# Patient Record
Sex: Male | Born: 1965
Health system: Southern US, Community
[De-identification: ages and names within clinical notes are randomized; demographics above are authoritative.]

## PROBLEM LIST (undated history)

## (undated) DIAGNOSIS — I10 Essential (primary) hypertension: Secondary | ICD-10-CM

## (undated) DIAGNOSIS — K5900 Constipation, unspecified: Secondary | ICD-10-CM

## (undated) DIAGNOSIS — D696 Thrombocytopenia, unspecified: Secondary | ICD-10-CM

## (undated) DIAGNOSIS — K922 Gastrointestinal hemorrhage, unspecified: Secondary | ICD-10-CM

## (undated) DIAGNOSIS — I8500 Esophageal varices without bleeding: Secondary | ICD-10-CM

## (undated) DIAGNOSIS — K759 Inflammatory liver disease, unspecified: Secondary | ICD-10-CM

## (undated) DIAGNOSIS — K760 Fatty (change of) liver, not elsewhere classified: Secondary | ICD-10-CM

## (undated) DIAGNOSIS — G4700 Insomnia, unspecified: Secondary | ICD-10-CM

## (undated) DIAGNOSIS — M1990 Unspecified osteoarthritis, unspecified site: Secondary | ICD-10-CM

## (undated) DIAGNOSIS — K920 Hematemesis: Secondary | ICD-10-CM

## (undated) DIAGNOSIS — E669 Obesity, unspecified: Secondary | ICD-10-CM

## (undated) DIAGNOSIS — Z860100 Personal history of colon polyps, unspecified: Secondary | ICD-10-CM

## (undated) DIAGNOSIS — K7581 Nonalcoholic steatohepatitis (NASH): Secondary | ICD-10-CM

## (undated) DIAGNOSIS — I864 Gastric varices: Secondary | ICD-10-CM

## (undated) DIAGNOSIS — K7460 Unspecified cirrhosis of liver: Secondary | ICD-10-CM

## (undated) DIAGNOSIS — D649 Anemia, unspecified: Secondary | ICD-10-CM

## (undated) DIAGNOSIS — T7840XA Allergy, unspecified, initial encounter: Secondary | ICD-10-CM

## (undated) DIAGNOSIS — D500 Iron deficiency anemia secondary to blood loss (chronic): Secondary | ICD-10-CM

## (undated) DIAGNOSIS — Z8619 Personal history of other infectious and parasitic diseases: Secondary | ICD-10-CM

## (undated) DIAGNOSIS — K3184 Gastroparesis: Secondary | ICD-10-CM

## (undated) DIAGNOSIS — G709 Myoneural disorder, unspecified: Secondary | ICD-10-CM

## (undated) DIAGNOSIS — G4730 Sleep apnea, unspecified: Secondary | ICD-10-CM

## (undated) DIAGNOSIS — M542 Cervicalgia: Secondary | ICD-10-CM

## (undated) DIAGNOSIS — E1143 Type 2 diabetes mellitus with diabetic autonomic (poly)neuropathy: Secondary | ICD-10-CM

## (undated) DIAGNOSIS — D61818 Other pancytopenia: Secondary | ICD-10-CM

## (undated) DIAGNOSIS — K219 Gastro-esophageal reflux disease without esophagitis: Secondary | ICD-10-CM

## (undated) DIAGNOSIS — R350 Frequency of micturition: Secondary | ICD-10-CM

## (undated) DIAGNOSIS — E119 Type 2 diabetes mellitus without complications: Secondary | ICD-10-CM

## (undated) DIAGNOSIS — R200 Anesthesia of skin: Secondary | ICD-10-CM

## (undated) DIAGNOSIS — R51 Headache: Secondary | ICD-10-CM

## (undated) DIAGNOSIS — D689 Coagulation defect, unspecified: Secondary | ICD-10-CM

## (undated) HISTORY — DX: Anemia, unspecified: D64.9

## (undated) HISTORY — DX: Iron deficiency anemia secondary to blood loss (chronic): D50.0

## (undated) HISTORY — PX: WISDOM TOOTH EXTRACTION: SHX21

## (undated) HISTORY — DX: Obesity, unspecified: E66.9

## (undated) HISTORY — DX: Allergy, unspecified, initial encounter: T78.40XA

## (undated) HISTORY — DX: Coagulation defect, unspecified: D68.9

## (undated) HISTORY — DX: Myoneural disorder, unspecified: G70.9

## (undated) HISTORY — DX: Personal history of colonic polyps: Z86.010

## (undated) HISTORY — DX: Essential (primary) hypertension: I10

## (undated) HISTORY — DX: Gastroparesis: K31.84

## (undated) HISTORY — DX: Unspecified cirrhosis of liver: K74.60

## (undated) HISTORY — DX: Fatty (change of) liver, not elsewhere classified: K76.0

## (undated) HISTORY — DX: Type 2 diabetes mellitus with diabetic autonomic (poly)neuropathy: E11.43

## (undated) HISTORY — DX: Gastro-esophageal reflux disease without esophagitis: K21.9

---

## 1999-02-24 ENCOUNTER — Ambulatory Visit (HOSPITAL_COMMUNITY): Admission: RE | Admit: 1999-02-24 | Discharge: 1999-02-24 | Payer: Self-pay | Admitting: Pulmonary Disease

## 1999-02-24 ENCOUNTER — Encounter: Payer: Self-pay | Admitting: Pulmonary Disease

## 2000-02-18 ENCOUNTER — Encounter: Admission: RE | Admit: 2000-02-18 | Discharge: 2000-05-18 | Payer: Self-pay | Admitting: Internal Medicine

## 2003-05-17 ENCOUNTER — Encounter: Admission: RE | Admit: 2003-05-17 | Discharge: 2003-05-17 | Payer: Self-pay | Admitting: Internal Medicine

## 2004-05-08 ENCOUNTER — Ambulatory Visit: Payer: Self-pay | Admitting: Internal Medicine

## 2004-05-20 ENCOUNTER — Ambulatory Visit (HOSPITAL_BASED_OUTPATIENT_CLINIC_OR_DEPARTMENT_OTHER): Admission: RE | Admit: 2004-05-20 | Discharge: 2004-05-20 | Payer: Self-pay | Admitting: Internal Medicine

## 2004-07-01 ENCOUNTER — Ambulatory Visit: Payer: Self-pay | Admitting: Internal Medicine

## 2008-02-09 ENCOUNTER — Ambulatory Visit: Payer: Self-pay | Admitting: Internal Medicine

## 2008-03-19 ENCOUNTER — Ambulatory Visit: Payer: Self-pay | Admitting: Internal Medicine

## 2008-06-18 ENCOUNTER — Ambulatory Visit: Payer: Self-pay | Admitting: Internal Medicine

## 2008-07-01 ENCOUNTER — Ambulatory Visit: Payer: Self-pay | Admitting: Internal Medicine

## 2008-07-04 ENCOUNTER — Ambulatory Visit: Payer: Self-pay | Admitting: Internal Medicine

## 2008-07-19 ENCOUNTER — Ambulatory Visit: Payer: Self-pay | Admitting: Internal Medicine

## 2008-08-16 ENCOUNTER — Ambulatory Visit: Payer: Self-pay | Admitting: Internal Medicine

## 2008-10-24 ENCOUNTER — Ambulatory Visit: Payer: Self-pay | Admitting: Internal Medicine

## 2008-12-05 ENCOUNTER — Ambulatory Visit: Payer: Self-pay | Admitting: Internal Medicine

## 2008-12-19 ENCOUNTER — Ambulatory Visit: Payer: Self-pay | Admitting: Internal Medicine

## 2008-12-26 ENCOUNTER — Ambulatory Visit: Payer: Self-pay | Admitting: Internal Medicine

## 2009-03-07 ENCOUNTER — Ambulatory Visit: Payer: Self-pay | Admitting: Internal Medicine

## 2009-06-27 ENCOUNTER — Ambulatory Visit: Payer: Self-pay | Admitting: Internal Medicine

## 2009-12-12 ENCOUNTER — Ambulatory Visit: Payer: Self-pay | Admitting: Internal Medicine

## 2010-07-02 ENCOUNTER — Other Ambulatory Visit: Payer: Self-pay | Admitting: Internal Medicine

## 2010-07-03 ENCOUNTER — Encounter: Payer: Self-pay | Admitting: Internal Medicine

## 2010-07-24 ENCOUNTER — Encounter (INDEPENDENT_AMBULATORY_CARE_PROVIDER_SITE_OTHER): Payer: BC Managed Care – PPO | Admitting: Internal Medicine

## 2010-07-24 DIAGNOSIS — E785 Hyperlipidemia, unspecified: Secondary | ICD-10-CM

## 2010-07-24 DIAGNOSIS — I10 Essential (primary) hypertension: Secondary | ICD-10-CM

## 2010-07-24 DIAGNOSIS — E119 Type 2 diabetes mellitus without complications: Secondary | ICD-10-CM

## 2010-09-11 NOTE — Procedures (Signed)
NAMEGURVIR, [name]         ACCOUNT NO.:  [id number]   MEDICAL RECORD NO.:  [id number]          PATIENT TYPE:  OUT   LOCATION:  SLEEP CENTER                 FACILITY:  [facility]   PHYSICIAN:  Clinton D. Annamaria Boots, M.D. DATE OF BIRTH:  12/23/1965   DATE OF STUDY:  05/20/2004                              NOCTURNAL POLYSOMNOGRAM   STUDY DATE:  May 20, 2004   REFERRING PHYSICIAN:  Dr. Baird Lyons   INDICATION FOR STUDY:  Hypersomnia with sleep apnea.  Epworth Sleepiness  Score 11/24.  BMI 43.  Weight 294 pounds.  Neck size 18-1/2 inches.   SLEEP ARCHITECTURE:  Total sleep time 345 minutes with sleep efficiency 80%.  Stage I was 14%, stage II 56%, stages III and IV 3%, REM was 27% of total  sleep time.  Sleep latency 24 minutes, REM latency 127 minutes, awake after  sleep onset 64 minutes, arousal index elevated 61.  Most arousals appeared  to be respiratory related.   RESPIRATORY DATA:  Split-study protocol.  RDI 114 per hour indicating very  severe obstructive sleep apnea/hypopnea syndrome before CPAP.  This included  160 obstructive apneas and 87 hypopneas before CPAP.  Events were not  positional.  REM RDI 47.  CPAP was titrated to 17 CWP, RDI 3.8 per hour.  A  small/wide Respironics ComfortSelect Nasal Mask was used with heated  humidifier.   OXYGEN DATA:  Moderate snoring with oxygen desaturation to a nadir of 55%  before CPAP.  After CPAP control saturation held 95-98% on room air.   CARDIAC RHYTHM:  Normal.   MOVEMENT/PARASOMNIA:  No significant movement disturbance.   IMPRESSION/RECOMMENDATION:  1.  Very severe obstructive sleep apnea/hypopnea syndrome, RDI 114 per hour      with desaturation to 55%.  2.  Successful continuous positive airway pressure control at 17 CWP, RDI      3.8 per hour, using a small/wide Respironics ComfortSelect Nasal Mask      with heated humidifier.      CDY/MEDQ  D:  05/24/2004 12:11:29  T:  05/24/2004 19:56:31  Job:  [id number]

## 2011-01-29 ENCOUNTER — Ambulatory Visit: Payer: BC Managed Care – PPO | Admitting: Internal Medicine

## 2011-02-05 ENCOUNTER — Encounter: Payer: Self-pay | Admitting: Internal Medicine

## 2011-02-05 ENCOUNTER — Ambulatory Visit: Payer: BC Managed Care – PPO | Admitting: Internal Medicine

## 2011-02-05 ENCOUNTER — Other Ambulatory Visit: Payer: Self-pay | Admitting: Internal Medicine

## 2011-02-05 ENCOUNTER — Ambulatory Visit (INDEPENDENT_AMBULATORY_CARE_PROVIDER_SITE_OTHER): Payer: BC Managed Care – PPO | Admitting: Internal Medicine

## 2011-02-05 DIAGNOSIS — R7401 Elevation of levels of liver transaminase levels: Secondary | ICD-10-CM

## 2011-02-05 DIAGNOSIS — E119 Type 2 diabetes mellitus without complications: Secondary | ICD-10-CM

## 2011-02-05 DIAGNOSIS — I10 Essential (primary) hypertension: Secondary | ICD-10-CM

## 2011-02-05 DIAGNOSIS — K760 Fatty (change of) liver, not elsewhere classified: Secondary | ICD-10-CM

## 2011-02-05 DIAGNOSIS — R748 Abnormal levels of other serum enzymes: Secondary | ICD-10-CM

## 2011-02-05 DIAGNOSIS — Z23 Encounter for immunization: Secondary | ICD-10-CM

## 2011-02-05 DIAGNOSIS — K7689 Other specified diseases of liver: Secondary | ICD-10-CM

## 2011-02-05 DIAGNOSIS — E785 Hyperlipidemia, unspecified: Secondary | ICD-10-CM

## 2011-02-05 DIAGNOSIS — K219 Gastro-esophageal reflux disease without esophagitis: Secondary | ICD-10-CM

## 2011-02-05 LAB — HEMOGLOBIN A1C: Mean Plasma Glucose: 341 mg/dL — ABNORMAL HIGH (ref <117)

## 2011-02-06 LAB — LIPID PANEL
Cholesterol: 245 mg/dL — ABNORMAL HIGH (ref 0–200)
VLDL: 25 mg/dL (ref 0–40)

## 2011-02-06 LAB — HEPATIC FUNCTION PANEL
ALT: 75 U/L — ABNORMAL HIGH (ref 0–53)
AST: 85 U/L — ABNORMAL HIGH (ref 0–37)
Bilirubin, Direct: 0.3 mg/dL (ref 0.0–0.3)
Indirect Bilirubin: 0.9 mg/dL (ref 0.0–0.9)

## 2011-02-08 ENCOUNTER — Encounter: Payer: Self-pay | Admitting: Internal Medicine

## 2011-02-08 NOTE — Progress Notes (Signed)
Spoke with patient regarding HgA1c results.  He is going to check blood sugars AC/HS and return for visit in 2 weeks.

## 2011-02-18 ENCOUNTER — Telehealth: Payer: Self-pay

## 2011-02-18 NOTE — Telephone Encounter (Signed)
Freestyle test strips not covered by patients insurance, and they would not  Let us do a prior authoriazation for them. Only covered meters are Accucheckss and One Touch Ultras, which they can receive at no cost. Patient's wife informed of this

## 2011-02-21 DIAGNOSIS — I10 Essential (primary) hypertension: Secondary | ICD-10-CM | POA: Insufficient documentation

## 2011-02-21 DIAGNOSIS — K7460 Unspecified cirrhosis of liver: Secondary | ICD-10-CM

## 2011-02-21 DIAGNOSIS — R748 Abnormal levels of other serum enzymes: Secondary | ICD-10-CM | POA: Insufficient documentation

## 2011-02-21 DIAGNOSIS — K7469 Other cirrhosis of liver: Secondary | ICD-10-CM | POA: Insufficient documentation

## 2011-02-21 DIAGNOSIS — E785 Hyperlipidemia, unspecified: Secondary | ICD-10-CM | POA: Insufficient documentation

## 2011-02-21 HISTORY — DX: Unspecified cirrhosis of liver: K74.60

## 2011-02-21 NOTE — Progress Notes (Signed)
  Subjective:    Patient ID: Oscar [name], male    DOB: [birth year], 45 y.o.   MRN: [id number]  HPI 45 year old male with history of hypertension, adult onset diabetes, fatty liver with abnormal liver functions, obesity, GE reflux. In today for followup of medical problems and to obtain lab work including a hemoglobin A1c. Has lost 20 7 AM since March 2012. Says he has changed his eating habits and doesn't eat as much fast food or unhealthy foods as he use to. However, says Accu-Chek machine is broken. I have given him a new one today along with prescription for diabetic test strips and lancets. He needs to check glucose at least once daily but prefer twice daily testing. I am concerned his glucose may not be as well controlled as we would like. He had Pneumovax immunization July 2010, tetanus immunization March 2011. Saw Dr. Hazle Quant for diabetic eye exam 2010 and is scheduled to see him in 2012.    Review of Systems     Objective:   Physical Exam neck is supple without JVD thyromegaly or carotid bruits; chest clear to auscultation; cardiac exam regular rate and rhythm normal S1 and S2; no lower extremity edema, no issues with ulcers on feet. Pulses intact bilaterally in feet.        Assessment & Plan:  Adult onset diabetes  Hypertension  Fatty liver with abnormal liver functions  Hyperlipidemia  Plan: Obtain hemoglobin A1c and make further recommendations at that time.

## 2011-02-21 NOTE — Patient Instructions (Signed)
We are pleased with your weight loss. However there is concern that he had not been monitoring her Accu-Cheks at least daily. New machine has been provided to use along with prescription for diabetic test strips and lancets. Please try to keep a log of your Accu-Cheks and return in 6-8 weeks for reevaluation.

## 2011-02-26 ENCOUNTER — Ambulatory Visit: Payer: BC Managed Care – PPO | Admitting: Internal Medicine

## 2011-03-12 ENCOUNTER — Encounter: Payer: Self-pay | Admitting: Internal Medicine

## 2011-03-12 ENCOUNTER — Ambulatory Visit (INDEPENDENT_AMBULATORY_CARE_PROVIDER_SITE_OTHER): Payer: BC Managed Care – PPO | Admitting: Internal Medicine

## 2011-03-12 DIAGNOSIS — E119 Type 2 diabetes mellitus without complications: Secondary | ICD-10-CM

## 2011-03-12 DIAGNOSIS — I10 Essential (primary) hypertension: Secondary | ICD-10-CM

## 2011-03-12 DIAGNOSIS — E785 Hyperlipidemia, unspecified: Secondary | ICD-10-CM

## 2011-03-12 DIAGNOSIS — R7989 Other specified abnormal findings of blood chemistry: Secondary | ICD-10-CM

## 2011-03-12 DIAGNOSIS — R110 Nausea: Secondary | ICD-10-CM

## 2011-03-12 NOTE — Patient Instructions (Signed)
Please try to keep a record of your Accu-Cheks at least 3 times daily before meals and hopefully at bedtime as well. Use sliding scale with short acting insulin as described 3 units for Accu-Chek 200-240 and 5 units from 240-300. Return in 4 weeks. Stop Byetta.

## 2011-03-12 NOTE — Progress Notes (Signed)
Subjective:    Patient ID: Oscar [name], male    DOB: 1966-03-09, 45 y.o.   MRN: [id number]  HPI 45 year old male with history of diabetes and hypertension. He was seen here mid October and hemoglobin A1c was not good at 13.5%. Had him come back to discuss his regimen today. He's on Janumet 50/500 twice daily plus Byetta supposedly twice daily but he's only been taking it once daily. He says recently he's been nauseated. He thinks the Byetta is causing the nausea. Sometimes he has to vomit. He has a history of fatty liver. He's cut back considerably on alcohol consumption but still in mid October had elevated liver functions with alkaline phosphatase being 140, SGOT 85 SGPT 75. Total cholesterol was 245 and LDL cholesterol was 153.  I spent considerable time today trying to figure out his work schedule. 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 He is a day usually begins at 10:00 at night and he drives 12 hours making 45 stops along the way to deliver pizzas. At 10 AM the next day, he is required to take a 10 hour break to sleep and rest according to trucking regulations. During this overnight chorea he will generally eat a [employer] sandwich before departing and not eat again until 10 AM the next day which may consist of [place] food or another [employer] sandwich. Therefore he and his not eating more than 2 meals daily at the present time. Sometimes he gets home as early as 6:30 in the morning. These are on "short days "2 days a week. On Friday and Saturday nights he goes to bed around 10 or 11 PM. On Sunday night, he stays up until 5 AM trying to get acclimated for his long night trip on Monday night. I have explained to him it would be best if he would stop it 3 AM and have a light meal. He thinks he can do the as a cause he has a delivery stop it about that time. He doesn't bring any Accu-Chek readings in today. I recently gave him a new meter. He says  that this morning his Accu-Chek was 244 at 6:30 AM. He says that around 10:30 AM his Accu-Chek was 134. He ate a small piece of [place] and then slept for couple of hours before coming to the doctor at 2:45 PM. This is all very confused and difficult  What to do with his glucose management. I am afraid to put him on long-acting excellence such as Lantus because his hours change so frequently during the work week. Also I don't have that information because he doesn't bring in multiple Accu-Chek readings. He says that his insurance that they would not approve more than once a day Accu-Cheks. I'm going to submit a prescription to [facility] for Accu-Cheks a.c. and at bedtime. Also submitted new prescription for new glucose monitor which they should cover. He says they did not cover the Freestyle machine that I gave him at last visit so he could not get test strips covered by them. It seems that the Byetta is causing considerable nausea. I am concerned about the elevated liver functions which should have improved with his weight loss. In March 2012 his Accu-Chek was 6.7%. In August 2011 he weighed 254 pounds. In March 2012 he weighed 271 pounds. He weighed 292 pounds in August 2009. Has lost 30 pounds since March. It seems his diabetes is now out of control.  He doesn't complain of polyuria or polyphagia. In fact, says that he feels nauseated and doesn't really want to eat. I do not want start him on lipid-lowering medication with elevated liver functions at this point in time. I have suggested that he use a sliding scale of short acting insulin which he hasn't time taking 3 units if Accu-Chek is between 202 40 and 5 units if Accu-Chek is between 240 and 300. He says he has had a couple of episodes of hypoglycemia when he was taking 5 units of short acting insulin previously. This was a little alarming  since he was driving a truck. He must eat 3 times daily. He says he will try to do this. He will try to check  Accu-Cheks 3 times or 4 times daily. I want to see him again in 4 weeks. He may well need to see an endocrinologist. He may also need to see a gastroenterologist. His blood pressure is under good control.    Review of Systems     Objective:   Physical Exam chest clear; cardiac exam: regular rate and rhythm; extremities without edema        Assessment & Plan:  Poorly controlled diabetes mellitus  Elevated liver functions  Hyperlipidemia  Nausea secondary to Byetta  Plan: See above and return in 4 weeks. Patient should try to eat 3 times daily and keep Accu-Cheks a.c. and hs. Stop Byetta

## 2011-04-16 ENCOUNTER — Ambulatory Visit: Payer: BC Managed Care – PPO | Admitting: Internal Medicine

## 2011-04-23 ENCOUNTER — Other Ambulatory Visit: Payer: Self-pay

## 2011-04-23 MED ORDER — HYDROCHLOROTHIAZIDE 25 MG PO TABS: 25.0000 mg | ORAL_TABLET | Freq: Every day | ORAL | Status: DC

## 2011-04-23 MED ORDER — ATENOLOL 50 MG PO TABS: 50.0000 mg | ORAL_TABLET | Freq: Two times a day (BID) | ORAL | Status: DC

## 2011-04-23 MED ORDER — SITAGLIPTIN PHOS-METFORMIN HCL 50-1000 MG PO TABS: 1.0000 | ORAL_TABLET | Freq: Two times a day (BID) | ORAL | Status: DC

## 2011-04-23 MED ORDER — EXENATIDE 10 MCG/0.04ML ~~LOC~~ SOPN: 10.0000 ug | PEN_INJECTOR | Freq: Two times a day (BID) | SUBCUTANEOUS | Status: DC

## 2011-04-23 MED ORDER — LISINOPRIL 20 MG PO TABS: 20.0000 mg | ORAL_TABLET | Freq: Every day | ORAL | Status: DC

## 2011-04-28 ENCOUNTER — Other Ambulatory Visit: Payer: Self-pay

## 2011-04-28 MED ORDER — ESOMEPRAZOLE MAGNESIUM 40 MG PO CPDR: 40.0000 mg | DELAYED_RELEASE_CAPSULE | Freq: Every day | ORAL | Status: DC

## 2011-04-30 ENCOUNTER — Ambulatory Visit: Payer: BC Managed Care – PPO | Admitting: Internal Medicine

## 2011-05-14 ENCOUNTER — Ambulatory Visit (INDEPENDENT_AMBULATORY_CARE_PROVIDER_SITE_OTHER): Payer: BC Managed Care – PPO | Admitting: Internal Medicine

## 2011-05-14 ENCOUNTER — Encounter: Payer: Self-pay | Admitting: Internal Medicine

## 2011-05-14 VITALS — BP 120/68 | HR 80 | Temp 99.6°F | Wt 263.5 lb

## 2011-05-14 DIAGNOSIS — IMO0001 Reserved for inherently not codable concepts without codable children: Secondary | ICD-10-CM

## 2011-05-14 DIAGNOSIS — K219 Gastro-esophageal reflux disease without esophagitis: Secondary | ICD-10-CM

## 2011-05-14 DIAGNOSIS — E119 Type 2 diabetes mellitus without complications: Secondary | ICD-10-CM

## 2011-05-14 DIAGNOSIS — I10 Essential (primary) hypertension: Secondary | ICD-10-CM

## 2011-05-14 DIAGNOSIS — E785 Hyperlipidemia, unspecified: Secondary | ICD-10-CM

## 2011-05-14 DIAGNOSIS — E1165 Type 2 diabetes mellitus with hyperglycemia: Secondary | ICD-10-CM

## 2011-05-14 NOTE — Progress Notes (Signed)
  Subjective:    Patient ID: Oscar [name], male    DOB: [birth year], 46 y.o.   MRN: [id number]  HPI patient with history of hypertension, obesity, fatty liver with elevated liver enzymes, poorly controlled diabetes mellitus, GE reflux, hypertension. Has gained 21-1/2 pounds since last office visit. Says that he was working very hard overnight driving the truck to [place] and not eating on a regular basis. His last hemoglobin A1c was off all at 13.5%. He is taking Janumet 50/1000 twice daily. He tolerates that well. For a few months now we've tried covering him with Novolin R 5 units before breakfast and supper. At last visit his cholesterol was all 07/27/1942. He said he had stopped taking generic Zocor at that point. Says he has restarted diet. Difficult to know how compliant he really years.  He had Pneumovax immunization 10/24/2008, tetanus immunization March 2011. Sees Dr. Bing Plume for eye exam and last had diabetic eye exam 2012.    Review of Systems     Objective:   Physical Exam chest clear to auscultation; cardiac exam regular rate and rhythm; extremities without edema. Diabetic foot exam no calluses or ulcers.        Assessment & Plan:  Poorly controlled diabetes  Hypertension  Hyperlipidemia  GE reflux  History of elevated liver enzymes due to fatty liver.  Plan: I am surprised it at 21-1/2 pound weight gain over the past few months. Am concerned hemoglobin A1c may not be. At this time either. He says that his Accu-Cheks have been running from 99-120 at least twice daily before meals.  Plan is to have him return in 3 months at which time she'll have fasting lipid panel. He is not fasting today. He will have liver functions at that time and hemoglobin A1c. He is to watch his weight. Have spoken with him at length in previous visits regarding healthy diet choices. 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

## 2011-05-14 NOTE — Patient Instructions (Signed)
Continue to watch Accu-Cheks at least twice daily. Did not gain any more weight. Try to make healthy diet choices.

## 2011-05-24 ENCOUNTER — Other Ambulatory Visit: Payer: Self-pay

## 2011-05-24 MED ORDER — SIMVASTATIN 10 MG PO TABS: 10.0000 mg | ORAL_TABLET | Freq: Every day | ORAL | Status: DC

## 2011-05-26 ENCOUNTER — Other Ambulatory Visit: Payer: Self-pay

## 2011-05-26 MED ORDER — INSULIN REGULAR HUMAN 100 UNIT/ML IJ SOLN: 5.0000 [IU] | Freq: Two times a day (BID) | INTRAMUSCULAR | Status: DC

## 2011-07-15 ENCOUNTER — Other Ambulatory Visit: Payer: Self-pay

## 2011-07-15 DIAGNOSIS — K219 Gastro-esophageal reflux disease without esophagitis: Secondary | ICD-10-CM

## 2011-07-15 MED ORDER — ESOMEPRAZOLE MAGNESIUM 40 MG PO CPDR: 40.0000 mg | DELAYED_RELEASE_CAPSULE | Freq: Every day | ORAL | Status: DC

## 2011-08-02 ENCOUNTER — Other Ambulatory Visit: Payer: BC Managed Care – PPO | Admitting: Internal Medicine

## 2011-08-06 ENCOUNTER — Ambulatory Visit: Payer: BC Managed Care – PPO | Admitting: Internal Medicine

## 2011-08-20 ENCOUNTER — Encounter: Payer: BC Managed Care – PPO | Admitting: Internal Medicine

## 2011-10-14 ENCOUNTER — Other Ambulatory Visit: Payer: Self-pay

## 2011-10-14 MED ORDER — LISINOPRIL 20 MG PO TABS: 20.0000 mg | ORAL_TABLET | Freq: Every day | ORAL | Status: DC

## 2011-10-14 MED ORDER — EXENATIDE 10 MCG/0.04ML ~~LOC~~ SOPN: 10.0000 ug | PEN_INJECTOR | Freq: Two times a day (BID) | SUBCUTANEOUS | Status: DC

## 2011-10-14 MED ORDER — HYDROCHLOROTHIAZIDE 25 MG PO TABS: 25.0000 mg | ORAL_TABLET | Freq: Every day | ORAL | Status: DC

## 2011-10-14 MED ORDER — ATENOLOL 50 MG PO TABS: 50.0000 mg | ORAL_TABLET | Freq: Two times a day (BID) | ORAL | Status: DC

## 2011-11-01 ENCOUNTER — Other Ambulatory Visit: Payer: BC Managed Care – PPO | Admitting: Internal Medicine

## 2011-11-05 ENCOUNTER — Ambulatory Visit (INDEPENDENT_AMBULATORY_CARE_PROVIDER_SITE_OTHER): Payer: BC Managed Care – PPO | Admitting: Internal Medicine

## 2011-11-05 ENCOUNTER — Other Ambulatory Visit (INDEPENDENT_AMBULATORY_CARE_PROVIDER_SITE_OTHER): Payer: BC Managed Care – PPO | Admitting: Internal Medicine

## 2011-11-05 ENCOUNTER — Encounter: Payer: Self-pay | Admitting: Internal Medicine

## 2011-11-05 VITALS — BP 110/68 | HR 72 | Resp 18 | Ht 67.0 in | Wt 254.0 lb

## 2011-11-05 DIAGNOSIS — E1165 Type 2 diabetes mellitus with hyperglycemia: Secondary | ICD-10-CM

## 2011-11-05 DIAGNOSIS — R748 Abnormal levels of other serum enzymes: Secondary | ICD-10-CM

## 2011-11-05 DIAGNOSIS — K7689 Other specified diseases of liver: Secondary | ICD-10-CM

## 2011-11-05 DIAGNOSIS — K760 Fatty (change of) liver, not elsewhere classified: Secondary | ICD-10-CM

## 2011-11-05 DIAGNOSIS — IMO0002 Reserved for concepts with insufficient information to code with codable children: Secondary | ICD-10-CM

## 2011-11-05 DIAGNOSIS — E669 Obesity, unspecified: Secondary | ICD-10-CM

## 2011-11-05 DIAGNOSIS — R7401 Elevation of levels of liver transaminase levels: Secondary | ICD-10-CM

## 2011-11-05 DIAGNOSIS — Z79899 Other long term (current) drug therapy: Secondary | ICD-10-CM

## 2011-11-05 DIAGNOSIS — E119 Type 2 diabetes mellitus without complications: Secondary | ICD-10-CM

## 2011-11-05 DIAGNOSIS — E785 Hyperlipidemia, unspecified: Secondary | ICD-10-CM

## 2011-11-05 DIAGNOSIS — K219 Gastro-esophageal reflux disease without esophagitis: Secondary | ICD-10-CM

## 2011-11-05 DIAGNOSIS — I10 Essential (primary) hypertension: Secondary | ICD-10-CM

## 2011-11-05 DIAGNOSIS — E118 Type 2 diabetes mellitus with unspecified complications: Secondary | ICD-10-CM

## 2011-11-05 LAB — HEMOGLOBIN A1C
Hgb A1c MFr Bld: 11.2 % — ABNORMAL HIGH (ref <5.7)
Mean Plasma Glucose: 275 mg/dL — ABNORMAL HIGH (ref <117)

## 2011-11-05 LAB — HEPATIC FUNCTION PANEL
AST: 92 U/L — ABNORMAL HIGH (ref 0–37)
Bilirubin, Direct: 0.3 mg/dL (ref 0.0–0.3)
Total Bilirubin: 1.4 mg/dL — ABNORMAL HIGH (ref 0.3–1.2)

## 2011-11-05 LAB — LIPID PANEL
Total CHOL/HDL Ratio: 3.8 Ratio
VLDL: 17 mg/dL (ref 0–40)

## 2011-11-05 LAB — GLUCOSE, POCT (MANUAL RESULT ENTRY)

## 2011-11-05 NOTE — Patient Instructions (Addendum)
Please try the diet and exercise. Continue to lose weight. You have lost 9 pounds since visit in January. You need to take better care of your diabetes. You need to monitor diabetes twice daily with Accu-Cheks. Continue same medications and return October 2013 for physical exam. You will receive fasting lab work at that time. Please be reminded about diabetic eye exam. Please remember to check  feet for ulcers on a daily basis.

## 2011-11-05 NOTE — Progress Notes (Signed)
  Subjective:    Patient ID: Oscar [name], male    DOB: 18-Sep-1965, 46 y.o.   MRN: [id number]  HPI Oscar [name] has not been seen since January 2013. He was supposed to be back in 3 months but said he could not work it out with his work schedule. Says for the past several weeks he has not been checking his Accu-Cheks on a regular basis. Thinks that they have been running okay when he does check it may be around 120. This is debatable. In October hemoglobin A1c was 13.5%. In January was down to 8.5%. His Accu-Chek in the office today 2.5  hours postprandial was over 334. 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 He also has a history of hypertension. Denies noncompliance with antihypertensive medication. History of GE reflux. History of fatty liver infiltration causing elevated liver enzymes. According to scales, he has lost 9 pounds since last office visit in January. BMI is 40.5 by my calculations. He mostly works and has little time to exercise despite multiple counseling sessions here in the office. He also takes Byetta twice daily. Says it does not cause nausea. Reminded about annual diabetic eye exam.    Review of Systems     Objective:   Physical Exam skin is warm and dry; diabetic foot exam: without ulcers or fungal infection. Pulses are normal in both feet. Neck is supple without thyromegaly or carotid bruits; chest clear to auscultation; cardiac exam regular rate and rhythm normal S1 and S2; abdomen: obese, nondistended, no hepatosplenomegaly masses or tenderness. Extremities without edema.        Assessment & Plan:  Hypertension  Poorly controlled diabetes mellitus  Obesity  Elevated liver enzymes likely secondary to fatty liver infiltration  History of GE reflux  Plan: Hemoglobin A1c, fasting lipid panel, liver  functions drawn this morning. Urine sent for microalbumin. He is on an ACE inhibitor (lisinopril ). He is also on Tenormin. He is on Zocor 10 mg daily. Reminded about diabetic eye exam. Return in October 2013 for physical exam.

## 2011-12-23 ENCOUNTER — Other Ambulatory Visit: Payer: Self-pay

## 2011-12-23 MED ORDER — ATENOLOL 50 MG PO TABS: 50.0000 mg | ORAL_TABLET | Freq: Two times a day (BID) | ORAL | Status: DC

## 2011-12-23 MED ORDER — HYDROCHLOROTHIAZIDE 25 MG PO TABS: 25.0000 mg | ORAL_TABLET | Freq: Every day | ORAL | Status: DC

## 2011-12-23 MED ORDER — LISINOPRIL 20 MG PO TABS: 20.0000 mg | ORAL_TABLET | Freq: Every day | ORAL | Status: DC

## 2012-01-10 ENCOUNTER — Other Ambulatory Visit: Payer: Self-pay

## 2012-01-10 MED ORDER — INSULIN PEN NEEDLE 31G X 8 MM MISC: 100.0000 [IU] | Freq: Every day | Status: DC

## 2012-01-28 ENCOUNTER — Other Ambulatory Visit: Payer: BC Managed Care – PPO | Admitting: Internal Medicine

## 2012-01-28 DIAGNOSIS — E119 Type 2 diabetes mellitus without complications: Secondary | ICD-10-CM

## 2012-01-28 DIAGNOSIS — I10 Essential (primary) hypertension: Secondary | ICD-10-CM

## 2012-01-28 LAB — CBC WITH DIFFERENTIAL/PLATELET
Basophils Absolute: 0 10*3/uL (ref 0.0–0.1)
Basophils Relative: 0 % (ref 0–1)
Eosinophils Absolute: 0.1 10*3/uL (ref 0.0–0.7)
Eosinophils Relative: 3 % (ref 0–5)
HCT: 37.6 % — ABNORMAL LOW (ref 39.0–52.0)
Hemoglobin: 12.1 g/dL — ABNORMAL LOW (ref 13.0–17.0)
Lymphocytes Relative: 29 % (ref 12–46)
Lymphs Abs: 1 10*3/uL (ref 0.7–4.0)
MCH: 28.5 pg (ref 26.0–34.0)
MCHC: 32.2 g/dL (ref 30.0–36.0)
MCV: 88.5 fL (ref 78.0–100.0)
Monocytes Absolute: 0.3 10*3/uL (ref 0.1–1.0)
Monocytes Relative: 9 % (ref 3–12)
Neutro Abs: 2 10*3/uL (ref 1.7–7.7)
Neutrophils Relative %: 59 % (ref 43–77)
Platelets: 94 10*3/uL — ABNORMAL LOW (ref 150–400)
RBC: 4.25 MIL/uL (ref 4.22–5.81)
RDW: 15.7 % — ABNORMAL HIGH (ref 11.5–15.5)
WBC: 3.5 10*3/uL — ABNORMAL LOW (ref 4.0–10.5)

## 2012-01-28 LAB — COMPREHENSIVE METABOLIC PANEL
AST: 48 U/L — ABNORMAL HIGH (ref 0–37)
Alkaline Phosphatase: 85 U/L (ref 39–117)
BUN: 13 mg/dL (ref 6–23)
Calcium: 9.7 mg/dL (ref 8.4–10.5)
Creat: 1.03 mg/dL (ref 0.50–1.35)
Glucose, Bld: 195 mg/dL — ABNORMAL HIGH (ref 70–99)

## 2012-01-28 LAB — LIPID PANEL
HDL: 59 mg/dL (ref >39)
LDL Cholesterol: 85 mg/dL (ref 0–99)
Total CHOL/HDL Ratio: 2.8 Ratio
Triglycerides: 100 mg/dL (ref <150)

## 2012-01-28 LAB — HEMOGLOBIN A1C
Hgb A1c MFr Bld: 11.2 % — ABNORMAL HIGH (ref <5.7)
Mean Plasma Glucose: 275 mg/dL — ABNORMAL HIGH (ref <117)

## 2012-01-31 ENCOUNTER — Other Ambulatory Visit: Payer: BC Managed Care – PPO | Admitting: Internal Medicine

## 2012-02-04 ENCOUNTER — Encounter: Payer: Self-pay | Admitting: Internal Medicine

## 2012-02-04 ENCOUNTER — Ambulatory Visit (INDEPENDENT_AMBULATORY_CARE_PROVIDER_SITE_OTHER): Payer: BC Managed Care – PPO | Admitting: Internal Medicine

## 2012-02-04 VITALS — BP 122/72 | HR 76 | Temp 98.7°F | Ht 67.5 in | Wt 240.0 lb

## 2012-02-04 DIAGNOSIS — Z23 Encounter for immunization: Secondary | ICD-10-CM

## 2012-02-04 DIAGNOSIS — E785 Hyperlipidemia, unspecified: Secondary | ICD-10-CM

## 2012-02-04 DIAGNOSIS — I10 Essential (primary) hypertension: Secondary | ICD-10-CM

## 2012-02-04 DIAGNOSIS — E669 Obesity, unspecified: Secondary | ICD-10-CM

## 2012-02-04 DIAGNOSIS — D649 Anemia, unspecified: Secondary | ICD-10-CM

## 2012-02-04 DIAGNOSIS — IMO0002 Reserved for concepts with insufficient information to code with codable children: Secondary | ICD-10-CM

## 2012-02-04 DIAGNOSIS — E1165 Type 2 diabetes mellitus with hyperglycemia: Secondary | ICD-10-CM

## 2012-02-04 DIAGNOSIS — K219 Gastro-esophageal reflux disease without esophagitis: Secondary | ICD-10-CM

## 2012-02-04 LAB — POCT URINALYSIS DIPSTICK
Bilirubin, UA: NEGATIVE
Blood, UA: NEGATIVE
Glucose, UA: NEGATIVE
Ketones, UA: NEGATIVE
Leukocytes, UA: NEGATIVE
Nitrite, UA: NEGATIVE
Protein, UA: NEGATIVE
Spec Grav, UA: 1.01
Urobilinogen, UA: NEGATIVE
pH, UA: 5.5

## 2012-02-04 LAB — RETICULOCYTES
ABS Retic: 53.2 K/uL (ref 19.0–186.0)
RBC.: 4.43 MIL/uL (ref 4.22–5.81)
Retic Ct Pct: 1.2 % (ref 0.4–2.3)

## 2012-02-05 LAB — VITAMIN B12: Vitamin B-12: 401 pg/mL (ref 211–911)

## 2012-02-05 LAB — IRON AND TIBC
Iron: 96 ug/dL (ref 42–165)
UIBC: 474 ug/dL — ABNORMAL HIGH (ref 125–400)

## 2012-02-24 ENCOUNTER — Encounter: Payer: Self-pay | Admitting: Internal Medicine

## 2012-02-24 NOTE — Progress Notes (Signed)
  Subjective:    Patient ID: Oscar [name], male    DOB: 01-13-1966, 46 y.o.   MRN: [id number]  HPI 46 year old male in today for followup of diabetes, hypertension, hyperlipidemia. Has not been doing well with diet recently. 3 months ago hemoglobin A1c was 11.2%. For some reason hasn't been motivated to improve. 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 He gets up around 6 PM, eats something and then began driving. He may stop once or twice before getting home around 6 or 7 AM and then goes to bed. Doesn't always eat healthy. Not good about checking Accu-Cheks while on the road. Has to make several stops with truck. Generally driving at least 5 nights a week.  Also has history of GE reflux. Remains overweight . Basically only eating 2 meals a day which is not good  being a diabetic. Have tried to work with him for some time now on getting his control better.   Recently had lab work showing hemoglobin 12.5 g consistent with anemia. SGOT was 48. History of fatty liver infiltration. Has lost 14 pounds since July. Hemoglobin A1c on October 4 11.2% which is a very same as it was in July. Insurance did not want to pay for Nexium. They also wanted him switched from Humulin R to Novolin R. They also wanted him switched from NPH insulin to Novolin 50/50 he is supposed to be taking Byetta twice daily.   Review of Systems     Objective:   Physical Exam skin is warm and dry. Nodes none. HEENT exam unremarkable. Neck is supple without JVD thyromegaly or carotid bruits. Chest clear to auscultation. Cardiac exam regular rate and rhythm normal S1 and S2. Extremities without edema. Diabetic foot exam no ulcers. Pulse is normal in feet.        Assessment & Plan:  Poorly controlled type 2 diabetes mellitus-long talk with patient about lifestyle and diet  Anemia-anemia studies added including B12, folate, iron and binding capacity and reticulocyte  count  Obesity  Hypertension  Hyperlipidemia  GE reflux  Plan:  Return in early December for followup.

## 2012-03-09 ENCOUNTER — Other Ambulatory Visit: Payer: Self-pay | Admitting: Internal Medicine

## 2012-03-31 ENCOUNTER — Ambulatory Visit (INDEPENDENT_AMBULATORY_CARE_PROVIDER_SITE_OTHER): Payer: BC Managed Care – PPO | Admitting: Internal Medicine

## 2012-03-31 ENCOUNTER — Encounter: Payer: Self-pay | Admitting: Internal Medicine

## 2012-03-31 VITALS — BP 124/68 | HR 80 | Temp 99.2°F | Wt 252.0 lb

## 2012-03-31 DIAGNOSIS — K219 Gastro-esophageal reflux disease without esophagitis: Secondary | ICD-10-CM

## 2012-03-31 DIAGNOSIS — E1165 Type 2 diabetes mellitus with hyperglycemia: Secondary | ICD-10-CM

## 2012-03-31 DIAGNOSIS — D649 Anemia, unspecified: Secondary | ICD-10-CM

## 2012-03-31 DIAGNOSIS — I10 Essential (primary) hypertension: Secondary | ICD-10-CM

## 2012-03-31 DIAGNOSIS — R110 Nausea: Secondary | ICD-10-CM

## 2012-03-31 DIAGNOSIS — IMO0002 Reserved for concepts with insufficient information to code with codable children: Secondary | ICD-10-CM

## 2012-03-31 DIAGNOSIS — E785 Hyperlipidemia, unspecified: Secondary | ICD-10-CM

## 2012-03-31 LAB — HEMOGLOBIN A1C
Hgb A1c MFr Bld: 9.5 % — ABNORMAL HIGH (ref <5.7)
Mean Plasma Glucose: 226 mg/dL — ABNORMAL HIGH (ref <117)

## 2012-03-31 LAB — CBC WITH DIFFERENTIAL/PLATELET
Basophils Relative: 0 % (ref 0–1)
Hemoglobin: 11.7 g/dL — ABNORMAL LOW (ref 13.0–17.0)
Lymphocytes Relative: 22 % (ref 12–46)
Lymphs Abs: 0.8 10*3/uL (ref 0.7–4.0)
Monocytes Relative: 11 % (ref 3–12)
Neutro Abs: 2.3 10*3/uL (ref 1.7–7.7)
Neutrophils Relative %: 65 % (ref 43–77)
RBC: 4.16 MIL/uL — ABNORMAL LOW (ref 4.22–5.81)

## 2012-03-31 NOTE — Progress Notes (Signed)
  Subjective:    Patient ID: Lenon Oms, male    DOB: 15-Mar-1966, 46 y.o.   MRN: [id number]  HPI One week history of nausea with 4-5 episodes of vomiting. Byetta also causing nausea prior to this week. More nauseated since stopping Nexium. Insurance will not pay for it. Weight has increased 12 pounds since October. Recent anemia workup showed no iron deficiency, no B12 or folate deficiency. He is here today for followup on anemia as well. 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    Review of Systems     Objective:   Physical Exam skin is warm and dry. Nodes none. Chest clear to auscultation. Cardiac exam regular rate and rhythm normal S1 and S2. Extremities without edema. Diabetic foot exam negative.        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 We have talked extensively about taking time to eat particularly at midnight and before he goes to bed around 6 or 7 AM. Followup in January.Repeat CBC and HGB AIC at that time.  Addendum: Hemoglobin A1c improved from 11.2% to 9.5%  Prior to October 2013 his last CBC was 07/24/2010. At that time he had a white blood cell count of 4500, hemoglobin 12.9 g, platelet count 101,000.  06/27/2009 he had a white blood cell count of 3000, hemoglobin 13.9 g, platelet count 101,000. B12 level in November 2010 was 250 which was low normal. In January 2009 he had white blood cell count of 4700, hemoglobin 17 g, MCV 94, platelet count 205,000. In April 2008 he had a hemoglobin of 17 g, white blood cell count 4400, platelet count 177,000.  In January 2007 he had a white blood cell count of 4700, hemoglobin 16.3 g, platelet count 171,000.  In March  2012 he had an iron level of 71, B12 level of 273 and folate level of 8.4. Microalbumin in March 2012 was 0.50. By July 2013 microalbumin was 2.29.  He has had mild elevations of his liver functions over the past few years SGOT was 68 and SGPT 65 and July 2010. At that time hemoglobin A1c was 12.9%

## 2012-04-26 DIAGNOSIS — Z8619 Personal history of other infectious and parasitic diseases: Secondary | ICD-10-CM

## 2012-04-26 HISTORY — DX: Personal history of other infectious and parasitic diseases: Z86.19

## 2012-05-04 ENCOUNTER — Telehealth: Payer: Self-pay | Admitting: Internal Medicine

## 2012-05-04 NOTE — Telephone Encounter (Signed)
Sp w/Shannon @ [facility]) [place] [phone].  One Touch Ultra Mini Test Strips #100 tid x 3 refills.  Lancets #100 x 3 refills.

## 2012-05-04 NOTE — Telephone Encounter (Signed)
Please call in should be checking accuchecks  At least bid if not tid. When is next appt?

## 2012-05-04 NOTE — Telephone Encounter (Signed)
Next appt 05/26/12 @ 12noon w/fasting lipid, liver, A1C.

## 2012-05-04 NOTE — Telephone Encounter (Signed)
Call in Diabetic test strips and lancets for tid testing

## 2012-05-05 ENCOUNTER — Ambulatory Visit: Payer: BC Managed Care – PPO | Admitting: Internal Medicine

## 2012-05-12 ENCOUNTER — Ambulatory Visit: Payer: BC Managed Care – PPO | Admitting: Internal Medicine

## 2012-05-26 ENCOUNTER — Encounter: Payer: Self-pay | Admitting: Internal Medicine

## 2012-05-26 ENCOUNTER — Ambulatory Visit (INDEPENDENT_AMBULATORY_CARE_PROVIDER_SITE_OTHER): Payer: BC Managed Care – PPO | Admitting: Internal Medicine

## 2012-05-26 VITALS — BP 116/58 | HR 80 | Wt 249.0 lb

## 2012-05-26 DIAGNOSIS — E1165 Type 2 diabetes mellitus with hyperglycemia: Secondary | ICD-10-CM

## 2012-05-26 DIAGNOSIS — I10 Essential (primary) hypertension: Secondary | ICD-10-CM

## 2012-05-26 DIAGNOSIS — IMO0001 Reserved for inherently not codable concepts without codable children: Secondary | ICD-10-CM

## 2012-05-26 DIAGNOSIS — E669 Obesity, unspecified: Secondary | ICD-10-CM

## 2012-05-26 DIAGNOSIS — IMO0002 Reserved for concepts with insufficient information to code with codable children: Secondary | ICD-10-CM

## 2012-05-26 DIAGNOSIS — E785 Hyperlipidemia, unspecified: Secondary | ICD-10-CM

## 2012-05-26 DIAGNOSIS — K219 Gastro-esophageal reflux disease without esophagitis: Secondary | ICD-10-CM

## 2012-05-26 NOTE — Patient Instructions (Addendum)
Return for fasting labs next week. If suitable results, return in 4 months. Avoid drinking regular [facility].

## 2012-05-26 NOTE — Progress Notes (Signed)
  Subjective:    Patient ID: Oscar [name], male    DOB: 08/11/1965, 47 y.o.   MRN: [id number]  HPI At 6 pm takes accucheck which is usually  about 130. 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 He then stops to eat, checks accucheck again usually runs 190-200 although was 297 once or twice.  Then finishes driving home from [place] around 6am-7am at which time he takes Byetta, eats breakfast and rests/sleeps for most of the day without eating. He feels like DM is doing better  And is not nauseated. Only taking Byetta once a day. Using sliding scale which I gave him at last visit. Has lost 3 pounds since last visit in December. Reviewed labs from late 2013 and he was anemic. However, B12, Fe/TIBC folate levels were normal. Will repeat fasting labs next week as he is not fasting today. Thinks sliding scale insulin works better for him. He cheats by drinking regular [facility] sometimes to stay awake. At last visit Hgb AIC was 9.5% which was the best it has been in over a year. Nausea that he had at last visit has resolved. It is difficult with his work schedule to devise a workable plan for him. He is sleeping about 10 hours a day.   Review of Systems     Objective:   Physical Exam skin warm and dry; chest clear to auscultation; cardiac exam: regular rate and rhythm normal S1 and S2; extremities without edema; neck supple without thyromegaly JVD or carotid bruits. Diabetic foot exam: No ulcers        Assessment & Plan:  Type 2 diabetes mellitus being treated with sliding scale in addition to Byetta  Hypertension  GE reflux  Plan: Return next week for fasting labs including lipid panel liver functions CBC with differential, TSH, vitamin D and hemoglobin A1c. Otherwise return in 4 months for repeat office visit in hemoglobin A 1C. Blood pressure under good control. Avoid drinking regular [facility]. Try diet [employer] instead. Spent 25 minutes with patient going  over regimen and activities.

## 2012-05-27 ENCOUNTER — Encounter: Payer: Self-pay | Admitting: Internal Medicine

## 2012-05-27 NOTE — Patient Instructions (Addendum)
Tried to eat regularly and take better care of yourself. Do Accu-Cheks more regularly. Followup in December. Anemia workup has been undertaken

## 2012-05-27 NOTE — Patient Instructions (Addendum)
Return in January for followup. Use sliding scale as directed for hyperglycemia

## 2012-05-29 ENCOUNTER — Other Ambulatory Visit: Payer: BC Managed Care – PPO | Admitting: Internal Medicine

## 2012-05-29 ENCOUNTER — Other Ambulatory Visit: Payer: Self-pay | Admitting: Internal Medicine

## 2012-05-30 ENCOUNTER — Other Ambulatory Visit: Payer: Self-pay

## 2012-05-30 ENCOUNTER — Telehealth: Payer: Self-pay | Admitting: Internal Medicine

## 2012-05-30 MED ORDER — SITAGLIPTIN PHOS-METFORMIN HCL 50-1000 MG PO TABS: 1.0000 | ORAL_TABLET | Freq: Two times a day (BID) | ORAL | Status: DC

## 2012-05-30 NOTE — Telephone Encounter (Signed)
Failed to keep appt for fasting lab work on Monday Feb 3 as scheduled. Has multiple medical issues that need evaluation with lab work. Pt contacted by phone. Received Answer machine message at home phone number. Message left to reschedule lab work ASAP.

## 2012-06-01 ENCOUNTER — Telehealth: Payer: Self-pay | Admitting: Internal Medicine

## 2012-06-01 ENCOUNTER — Other Ambulatory Visit: Payer: BC Managed Care – PPO | Admitting: Internal Medicine

## 2012-06-01 DIAGNOSIS — D649 Anemia, unspecified: Secondary | ICD-10-CM

## 2012-06-01 DIAGNOSIS — E785 Hyperlipidemia, unspecified: Secondary | ICD-10-CM

## 2012-06-01 DIAGNOSIS — E1165 Type 2 diabetes mellitus with hyperglycemia: Secondary | ICD-10-CM

## 2012-06-01 DIAGNOSIS — IMO0002 Reserved for concepts with insufficient information to code with codable children: Secondary | ICD-10-CM

## 2012-06-01 DIAGNOSIS — Z79899 Other long term (current) drug therapy: Secondary | ICD-10-CM

## 2012-06-01 LAB — LIPID PANEL
Cholesterol: 193 mg/dL (ref 0–200)
LDL Cholesterol: 112 mg/dL — ABNORMAL HIGH (ref 0–99)
Triglycerides: 134 mg/dL (ref <150)

## 2012-06-01 LAB — HEPATIC FUNCTION PANEL
Alkaline Phosphatase: 99 U/L (ref 39–117)
Indirect Bilirubin: 0.6 mg/dL (ref 0.0–0.9)
Total Protein: 6.8 g/dL (ref 6.0–8.3)

## 2012-06-01 LAB — CBC WITH DIFFERENTIAL/PLATELET
Basophils Relative: 1 % (ref 0–1)
Eosinophils Absolute: 0.1 10*3/uL (ref 0.0–0.7)
Eosinophils Relative: 2 % (ref 0–5)
Hemoglobin: 12.1 g/dL — ABNORMAL LOW (ref 13.0–17.0)
Lymphs Abs: 0.8 10*3/uL (ref 0.7–4.0)
MCH: 28.9 pg (ref 26.0–34.0)
MCHC: 33.9 g/dL (ref 30.0–36.0)
MCV: 85.4 fL (ref 78.0–100.0)
Monocytes Relative: 10 % (ref 3–12)
RBC: 4.18 MIL/uL — ABNORMAL LOW (ref 4.22–5.81)

## 2012-06-01 NOTE — Telephone Encounter (Signed)
Trying to clarify what sliding scale instructions he has.  Less than 200- no regular insulin 200-250- 5 units regular insulin 250-300- 7 units regular insulin Over 300 -10 units regular insulin

## 2012-06-02 NOTE — Progress Notes (Signed)
Patient informed. Will do Hematology referral on Monday,

## 2012-06-13 ENCOUNTER — Telehealth: Payer: Self-pay | Admitting: Hematology & Oncology

## 2012-06-13 NOTE — Telephone Encounter (Signed)
Left message for pt to call and schedule appointment

## 2012-06-14 ENCOUNTER — Telehealth: Payer: Self-pay | Admitting: Hematology & Oncology

## 2012-06-14 NOTE — Telephone Encounter (Signed)
Left pt message to call and schedule appointment

## 2012-06-15 ENCOUNTER — Telehealth: Payer: Self-pay | Admitting: Internal Medicine

## 2012-06-15 ENCOUNTER — Telehealth: Payer: Self-pay | Admitting: Hematology & Oncology

## 2012-06-15 DIAGNOSIS — D61818 Other pancytopenia: Secondary | ICD-10-CM | POA: Insufficient documentation

## 2012-06-15 NOTE — Telephone Encounter (Signed)
PT MADE 2-21 APPOINTMENT

## 2012-06-15 NOTE — Telephone Encounter (Signed)
Pt called back moved 2-21 to 2-28 she is aware it is 4 days overdue from what referring indicated as a follow up

## 2012-06-15 NOTE — Telephone Encounter (Signed)
Patient has insulin-dependent diabetes mellitus. We have observed that he has a significant anemia, platelet count 76,000 and white blood cell count 2600. We are concerned about this pancytopenia. We will to refer him to hematology. The patient coordinator at the cancer center called and said they had called him 3 times and he did not return or call. I have called today and left next visit message on his voice mail explaining his blood cell counts and why he is being referred to hematologist. He is to call us back and let us know he will follow through with this appointment.

## 2012-06-15 NOTE — Telephone Encounter (Signed)
Appt with Dr. Drue Dun noted for Feb 28th.

## 2012-06-15 NOTE — Telephone Encounter (Signed)
Sharyn Lull at referring aware pt has not returned phone calls. Fax did not got through

## 2012-06-15 NOTE — Telephone Encounter (Signed)
Left pt message to call and schedule appointment. Faxed to referal that I have left 3 messages for pt to call

## 2012-06-16 ENCOUNTER — Ambulatory Visit: Payer: BC Managed Care – PPO

## 2012-06-16 ENCOUNTER — Other Ambulatory Visit: Payer: BC Managed Care – PPO | Admitting: Lab

## 2012-06-16 ENCOUNTER — Ambulatory Visit: Payer: BC Managed Care – PPO | Admitting: Medical

## 2012-06-23 ENCOUNTER — Ambulatory Visit (HOSPITAL_BASED_OUTPATIENT_CLINIC_OR_DEPARTMENT_OTHER): Payer: BC Managed Care – PPO

## 2012-06-23 ENCOUNTER — Telehealth: Payer: Self-pay | Admitting: Hematology & Oncology

## 2012-06-23 ENCOUNTER — Other Ambulatory Visit (HOSPITAL_BASED_OUTPATIENT_CLINIC_OR_DEPARTMENT_OTHER): Payer: BC Managed Care – PPO | Admitting: Lab

## 2012-06-23 ENCOUNTER — Ambulatory Visit (HOSPITAL_BASED_OUTPATIENT_CLINIC_OR_DEPARTMENT_OTHER): Payer: BC Managed Care – PPO | Admitting: Medical

## 2012-06-23 VITALS — BP 111/65 | HR 79 | Temp 98.2°F | Resp 18 | Ht 67.0 in | Wt 246.0 lb

## 2012-06-23 DIAGNOSIS — Z8051 Family history of malignant neoplasm of kidney: Secondary | ICD-10-CM

## 2012-06-23 DIAGNOSIS — I10 Essential (primary) hypertension: Secondary | ICD-10-CM

## 2012-06-23 DIAGNOSIS — D649 Anemia, unspecified: Secondary | ICD-10-CM

## 2012-06-23 DIAGNOSIS — IMO0002 Reserved for concepts with insufficient information to code with codable children: Secondary | ICD-10-CM

## 2012-06-23 DIAGNOSIS — D61818 Other pancytopenia: Secondary | ICD-10-CM

## 2012-06-23 DIAGNOSIS — E1165 Type 2 diabetes mellitus with hyperglycemia: Secondary | ICD-10-CM

## 2012-06-23 LAB — CBC WITH DIFFERENTIAL (CANCER CENTER ONLY)
BASO#: 0 10*3/uL (ref 0.0–0.2)
Eosinophils Absolute: 0 10*3/uL (ref 0.0–0.5)
HGB: 12.3 g/dL — ABNORMAL LOW (ref 13.0–17.1)
LYMPH#: 0.7 10*3/uL — ABNORMAL LOW (ref 0.9–3.3)
MCH: 29.4 pg (ref 28.0–33.4)
MONO#: 0.3 10*3/uL (ref 0.1–0.9)
NEUT#: 1.9 10*3/uL (ref 1.5–6.5)
RBC: 4.18 10*6/uL — ABNORMAL LOW (ref 4.20–5.70)
WBC: 3 10*3/uL — ABNORMAL LOW (ref 4.0–10.0)

## 2012-06-23 NOTE — H&P (Signed)
[facility] NEW PATIENT EVALUATION   Name: Oscar [name] Date: 06/23/2012 MRN: [id number] DOB: [birth year]   REFERRING PHYSICIAN: Margaree Mackintosh, MD  REASON FOR REFERRAL: Pancytopenia    HISTORY OF PRESENT ILLNESS:Oscar [name] is a 47 y.o. male who was kindly referred to [place] by Dr. Lenord Fellers for further evaluation of his pancytopenia.  This is a pleasant, gentleman, who has a history of hypertension, and diabetes.  He is on insulin.  His blood sugars are not well controlled.  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  I do believe overall, this is a lifestyle issue.  He was recently seen by Dr. Lenord Fellers, and reported that his white count, hemoglobin, and platelets still continued to remain low.  She wanted him to  followup with hematologist.  Looking back over a four-month period,  his white count was 3.5, today it is 3.0.  His hemoglobin was 12.1, it is 12.3 today.  His platelet count was 94,000, and today, they're 95,000.  He, reports, that overall, he feels, well.  He does report fatigue, but relates this to working nights.  He really is not noticing any unintentional weight loss, or weight gain.  Reports, he has a good appetite.  He does not report any obvious, or abnormal bleeding or bruising.  He has not reported any fevers, chills, or night sweats.  He's not reported any palpable adenopathy.  He does report some intermittent mouth sores from acidic foods or drinks.  He does report, that he has nausea, vomiting, intermittently with his diabetic medication.  He is on protonix.  He does not report any diarrhea, or constipation.  Any chest pain, shortness of breath, or cough.  He does not report any obvious, rashes.  He does not report any, headaches, visual changes, dysphasia or odynophagia.  He does not report any lower leg swelling.  He does not report any abdominal pain.  He's not reported any acute or chronic upper  respiratory infections. When I was reviewing his social history he does report, that he drinks an excessive amount of beer on the weekends.  He drinks about 24 cans of beer over the weekend.  His last abdominal ultrasound was back in 2005, which revealed a fatty liver.   PAST MEDICAL HISTORY:  has a past medical history of Diabetes mellitus; Hypertension; GERD (gastroesophageal reflux disease); Fatty liver; and Obesity.     PAST SURGICAL HISTORY:No past surgical history.  Current Outpatient Prescriptions on File Prior to Visit  Medication Sig Dispense Refill  . aspirin 81 MG tablet Take 81 mg by mouth daily.        . hydrochlorothiazide (HYDRODIURIL) 25 MG tablet Take 1 tablet (25 mg total) by mouth daily.  90 tablet  3  . insulin NPH-insulin regular (NOVOLIN 50/50) (50-50) 100 UNIT/ML injection Inject into the skin daily. Sliding scale      . Insulin Pen Needle 31G X 8 MM MISC 100 Units by Does not apply route daily.  100 each  5  . Lancets (ONETOUCH ULTRASOFT) lancets       . lisinopril (PRINIVIL,ZESTRIL) 20 MG tablet Take 1 tablet (20 mg total) by mouth daily.  90 tablet  1  . pantoprazole (PROTONIX) 40 MG tablet Take 40 mg by mouth daily.       . sitaGLIPtan-metformin (JANUMET) 50-1000 MG per tablet Take 1 tablet by mouth 2 (two) times daily with a meal.  180 tablet  0   No current facility-administered medications on file prior to visit.    ALLERGIES: NKDA   SOCIAL HISTORY: He currently resides in [place], he, works full-time as a Naval architect at night.  He does drink alcohol on the weekends.  He drinks mostly beer 12 pack on both Saturday and Sunday. He reports that he has never smoked. He has never used smokeless tobacco. He reports that he does not use illicit drugs.   FAMILY HISTORY: Dad with a history of kidney cancer, brother who is diabetic, mother with history of cardiac issues   LABORATORY DATA:  Results for orders placed in visit on 06/23/12 (from the past 48  hour(s))  CBC WITH DIFFERENTIAL ([facility])     Status: Abnormal   Collection Time    06/23/12  1:38 PM      Result Value Range   WBC 3.0 (*) 4.0 - 10.0 10e3/uL   RBC 4.18 (*) 4.20 - 5.70 10e6/uL   HGB 12.3 (*) 13.0 - 17.1 g/dL   HCT 40.9 (*) 81.1 - 91.4 %   MCV 88  82 - 98 fL   MCH 29.4  28.0 - 33.4 pg   MCHC 33.4  32.0 - 35.9 g/dL   RDW 78.2  95.6 - 21.3 %   Platelets 95 (*) 145 - 400 10e3/uL   NEUT# 1.9  1.5 - 6.5 10e3/uL   LYMPH# 0.7 (*) 0.9 - 3.3 10e3/uL   MONO# 0.3  0.1 - 0.9 10e3/uL   Eosinophils Absolute 0.0  0.0 - 0.5 10e3/uL   BASO# 0.0  0.0 - 0.2 10e3/uL   NEUT% 64.6  40.0 - 80.0 %   LYMPH% 23.4  14.0 - 48.0 %   MONO% 10.0  0.0 - 13.0 %   EOS% 1.3  0.0 - 7.0 %   BASO% 0.7  0.0 - 2.0 %  [facility] - SMEAR     Status: None   Collection Time    06/23/12  1:38 PM      Result Value Range   Smear Result Smear Available      Peripheral smear  shows a well defined population of red blood cells. There is no  polychromasia. I do not see any nucleated red blood cells. There are  no schistocytes. He had no rouleaux formation. I saw no target cells.  There are no sickle cells. His white cells appear normal in morphology  and maturation. He has no hypersegmented polys. There are no immature  myeloid or lymphoid forms.  I do not see any blasts. Platelets are large.  Review of Systems: Constitutional:Negative for malaise/fatigue, fever, chills, weight loss, diaphoresis, activity change, appetite change, and unexpected weight change.  HEENT: Negative for double vision, blurred vision, visual loss, ear pain, tinnitus, congestion, rhinorrhea, epistaxis sore throat or sinus disease, oral pain/lesion, tongue soreness Respiratory: Negative for cough, chest tightness, shortness of breath, wheezing and stridor.  Cardiovascular: Negative for chest pain, palpitations, leg swelling, orthopnea, PND, DOE or claudication Gastrointestinal: Negative for nausea, vomiting, abdominal  pain, diarrhea, constipation, blood in stool, melena, hematochezia, abdominal distention, anal bleeding, rectal pain, anorexia and hematemesis.  Genitourinary: Negative for dysuria, frequency, hematuria,  Musculoskeletal: Negative for myalgias, back pain, joint swelling, arthralgias and gait problem.  Skin: Negative for rash, color change, pallor and wound.  Neurological:. Negative for dizziness/light-headedness, tremors, seizures, syncope, facial asymmetry, speech difficulty, weakness, numbness, headaches and paresthesias.  Hematological: Negative for adenopathy. Does not bruise/bleed easily.  Psychiatric/Behavioral:  Negative  for depression, no loss of interest in normal activity or change in sleep pattern.   Physical Exam: This is a pleasant, obese, 47 year old, white gentleman, in no obvious distress Vitals: Temperature 98.2 degrees, pulse 79, respirations 18, blood pressure 111/65.  Weight 246 pounds HEENT reveals a normocephalic, atraumatic skull, no scleral icterus, no oral lesions  Neck is supple without any cervical or supraclavicular adenopathy.  Lungs are clear to auscultation bilaterally. There are no wheezes, rales or rhonci Cardiac is regular rate and rhythm with a normal S1 and S2. There are no murmurs, rubs, or bruits.  Abdomen is soft with good bowel sounds, there is no palpable mass. There is no palpable hepatosplenomegaly. There is no palpable fluid wave.  Musculoskeletal no tenderness of the spine, ribs, or hips.  Extremities there are no clubbing, cyanosis, or edema.  Skin no petechia, purpura or ecchymosis Neurologic is nonfocal.  Assessment/Plan: This is a pleasant, 47 year old, gentleman, with the following issues:  #1.  Pancytopenia-his peripheral smear did not reveal any abnormalities, although his platelets were large.  There was no related formation or target cells.  There were no blasts.  There is no evidence of any type of leukemia.  There could be some issue.   Obviously with his bone marrow.  At this time, I do not want to be too aggressive with his bone marrow, biopsy.  We will plan on bringing him back in another 6 weeks, and at that time, reevaluate.  His blood counts.  If need be, we can go ahead and set up a bone marrow, biopsy, at that time.  #2.  Diabetes.  He will continue to followup with Dr. Lenord Fellers.  He does have uncontrolled blood sugars.  #3.  Hypertension.  He will continue on his antihypertensive  #4.  Followup.  We'll follow back up with him in 4 weeks, but before then should there be questions or concerns.

## 2012-06-23 NOTE — Progress Notes (Signed)
This office note has been dictated.

## 2012-06-23 NOTE — Telephone Encounter (Signed)
Patient cx 08/08/12 apt and resch for 08/11/12

## 2012-06-26 LAB — COMPREHENSIVE METABOLIC PANEL
Albumin: 3.8 g/dL (ref 3.5–5.2)
BUN: 7 mg/dL (ref 6–23)
CO2: 22 mEq/L (ref 19–32)
Calcium: 9.5 mg/dL (ref 8.4–10.5)
Chloride: 102 mEq/L (ref 96–112)
Glucose, Bld: 236 mg/dL — ABNORMAL HIGH (ref 70–99)
Potassium: 3.7 mEq/L (ref 3.5–5.3)
Total Protein: 7.5 g/dL (ref 6.0–8.3)

## 2012-06-26 LAB — RETICULOCYTES (CHCC)
ABS Retic: 55.8 10*3/uL (ref 19.0–186.0)
RBC.: 4.29 MIL/uL (ref 4.22–5.81)

## 2012-06-26 LAB — FERRITIN: Ferritin: 25 ng/mL (ref 22–322)

## 2012-06-26 LAB — IRON AND TIBC: Iron: 50 ug/dL (ref 42–165)

## 2012-06-28 ENCOUNTER — Other Ambulatory Visit: Payer: Self-pay

## 2012-06-28 MED ORDER — LISINOPRIL 20 MG PO TABS: 20.0000 mg | ORAL_TABLET | Freq: Every day | ORAL | Status: DC

## 2012-06-29 ENCOUNTER — Other Ambulatory Visit: Payer: Self-pay

## 2012-06-29 MED ORDER — LISINOPRIL 20 MG PO TABS: 20.0000 mg | ORAL_TABLET | Freq: Every day | ORAL | Status: DC

## 2012-07-07 ENCOUNTER — Telehealth: Payer: Self-pay | Admitting: Internal Medicine

## 2012-07-07 NOTE — Telephone Encounter (Signed)
Phone call from Dr. Cleta Alberts at [facility] 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 Patient cannot be cleared because he is on insulin. Refer to endocrinologist for evaluation.

## 2012-08-08 ENCOUNTER — Other Ambulatory Visit: Payer: BC Managed Care – PPO | Admitting: Lab

## 2012-08-08 ENCOUNTER — Ambulatory Visit: Payer: BC Managed Care – PPO | Admitting: Hematology & Oncology

## 2012-08-11 ENCOUNTER — Ambulatory Visit (HOSPITAL_BASED_OUTPATIENT_CLINIC_OR_DEPARTMENT_OTHER): Payer: BC Managed Care – PPO | Admitting: Hematology & Oncology

## 2012-08-11 ENCOUNTER — Other Ambulatory Visit (HOSPITAL_BASED_OUTPATIENT_CLINIC_OR_DEPARTMENT_OTHER): Payer: BC Managed Care – PPO | Admitting: Lab

## 2012-08-11 VITALS — BP 108/56 | HR 68 | Temp 98.1°F | Resp 18 | Ht 67.0 in | Wt 251.0 lb

## 2012-08-11 DIAGNOSIS — D649 Anemia, unspecified: Secondary | ICD-10-CM

## 2012-08-11 DIAGNOSIS — F1010 Alcohol abuse, uncomplicated: Secondary | ICD-10-CM

## 2012-08-11 DIAGNOSIS — D61818 Other pancytopenia: Secondary | ICD-10-CM

## 2012-08-11 LAB — CBC WITH DIFFERENTIAL (CANCER CENTER ONLY)
BASO#: 0 10*3/uL (ref 0.0–0.2)
Eosinophils Absolute: 0.1 10*3/uL (ref 0.0–0.5)
HCT: 34.3 % — ABNORMAL LOW (ref 38.7–49.9)
HGB: 11.1 g/dL — ABNORMAL LOW (ref 13.0–17.1)
LYMPH#: 0.8 10*3/uL — ABNORMAL LOW (ref 0.9–3.3)
MCHC: 32.4 g/dL (ref 32.0–35.9)
MONO#: 0.3 10*3/uL (ref 0.1–0.9)
NEUT%: 58.4 % (ref 40.0–80.0)
WBC: 3 10*3/uL — ABNORMAL LOW (ref 4.0–10.0)

## 2012-08-11 NOTE — Progress Notes (Signed)
This office note has been dictated.

## 2012-08-12 NOTE — Progress Notes (Signed)
CC:   Cresenciano Lick. Renold Genta, M.D.  DIAGNOSIS:  Chronic pancytopenia.  CURRENT THERAPY:  Observation.  INTERIM HISTORY:  Mr. Shughart comes in for followup.  He was seen by Olivia Mackie back in February.  He had lab work done for the pancytopenia.  So far all these tests have pretty much come back normal.  He did have iron studies which showed a ferritin of 25 and an iron saturation of 9%.  He had a normal erythropoietin level.  TSH done previously was 1.16.  There is no obvious renal failure.  BUN and creatinine were 7 and 0.86. His LFTs were elevated.  I believe Mr. Matsuo does have more of an alcohol history than what he may admit to.  His wife, who is with him, does seem to agree that he does drink quite a bit.  He does have diabetes.  He is on insulin.  He has had this for 10 years.  He is working.  He is doing okay working.  He has had no infections.  He has had no fever.  He has had no bleeding. He has had no obvious change in bowel or bladder habits.  PHYSICAL EXAMINATION:  General:  This is a somewhat obese white gentleman in no obvious distress.  Vital Signs:  Temperature of 98.1, pulse 68, respiratory rate 18, blood pressure 108/56.  Weight is 251. Head and Neck:  Normocephalic, atraumatic skull.  There are no ocular or oral lesions.  There is no adenopathy in the neck.  I see no glossitis. Thyroid is nonpalpable.  Lungs:  Clear bilaterally.  Cardiac:  Regular rate and rhythm with a normal S1, S2.  There are no murmurs, rubs, or bruits.  Abdomen:  Soft with good bowel sounds.  There is no palpable abdominal mass.  There is no fluid wave.  His spleen tip may be palpable at the left costal margin.  Back:  No tenderness over the spine, ribs, or hips.  Extremities:  No clubbing, cyanosis or edema.  Skin:  No rashes, ecchymoses, or petechia.  Neurological:  No focal neurological deficits.  LABORATORY STUDIES:  White cell count 3, hemoglobin 11.1, hematocrit 34.3, platelet count  99,000.  Peripheral smear shows a normochromic, normocytic population of red blood cells.  I do not see any schistocytes.  There are no spherocytes. I see no nucleated red blood cells.  He has no target cells.  Rouleaux formation is not noted.  White cells are decreased in number.  There are no hypersegmented polys.  I see no immature myeloid or lymphoid forms. He has no blasts.  I see no atypical lymphocytes.  Platelets are mildly decreased in number.  He has several large platelets.  IMPRESSION:  Mr. Innocent is a nice, 47 year old gentleman.  He has pancytopenia.  I have to believe that this is going to be some type of consumption process.  Again, I think I can feel the spleen at the left costal margin.  We may need to consider an ultrasound again of his abdomen.  He last had one a few years ago.  I think he is taking more alcohol than he admits to.  This is a "poison" to the marrow, so this also can be an etiology.  For now, he is asymptomatic.  I think that we are probably going to have to do a bone marrow test on him at some point in the future.  I just want to get too overly aggressive right now.  I want to  see him back in a couple of months.  I talked to him about alcohol use.  This certainly is not going to help his diabetes.  It is not going to help his liver as he appears to have started working on a "fatty liver."  He is a nice guy.  It is fun talking to him and his wife.    ______________________________ Volanda Napoleon, M.D. PRE/MEDQ  D:  08/11/2012  T:  08/12/2012  Job:  [id number]

## 2012-08-21 NOTE — Progress Notes (Signed)
Thanks for your careful evaluation.

## 2012-09-22 ENCOUNTER — Ambulatory Visit: Payer: BC Managed Care – PPO | Admitting: Internal Medicine

## 2012-11-10 ENCOUNTER — Ambulatory Visit (HOSPITAL_BASED_OUTPATIENT_CLINIC_OR_DEPARTMENT_OTHER): Payer: BC Managed Care – PPO | Admitting: Hematology & Oncology

## 2012-11-10 ENCOUNTER — Other Ambulatory Visit (HOSPITAL_BASED_OUTPATIENT_CLINIC_OR_DEPARTMENT_OTHER): Payer: BC Managed Care – PPO | Admitting: Lab

## 2012-11-10 VITALS — BP 124/67 | HR 82 | Temp 98.6°F | Resp 18 | Ht 67.0 in | Wt 243.0 lb

## 2012-11-10 DIAGNOSIS — D61818 Other pancytopenia: Secondary | ICD-10-CM

## 2012-11-10 LAB — CBC WITH DIFFERENTIAL (CANCER CENTER ONLY)
BASO%: 0.4 % (ref 0.0–2.0)
EOS%: 2.7 % (ref 0.0–7.0)
HCT: 33.8 % — ABNORMAL LOW (ref 38.7–49.9)
LYMPH#: 0.6 10*3/uL — ABNORMAL LOW (ref 0.9–3.3)
LYMPH%: 22.9 % (ref 14.0–48.0)
MCHC: 32.2 g/dL (ref 32.0–35.9)
MONO#: 0.3 10*3/uL (ref 0.1–0.9)
NEUT%: 64.3 % (ref 40.0–80.0)
Platelets: 72 10*3/uL — ABNORMAL LOW (ref 145–400)
RDW: 16.1 % — ABNORMAL HIGH (ref 11.1–15.7)
WBC: 2.6 10*3/uL — ABNORMAL LOW (ref 4.0–10.0)

## 2012-11-10 LAB — CHCC SATELLITE - SMEAR

## 2012-11-10 LAB — RETICULOCYTES (CHCC)
ABS Retic: 62.2 10*3/uL (ref 19.0–186.0)
RBC.: 3.89 MIL/uL — ABNORMAL LOW (ref 4.22–5.81)

## 2012-11-10 NOTE — Progress Notes (Signed)
This office note has been dictated.

## 2012-11-11 NOTE — Progress Notes (Signed)
CC:   [name], M.D.  DIAGNOSIS:  Pancytopenia.  CURRENT THERAPY:  Observation.  INTERIM HISTORY:  Mr. Oscar [name] comes in for followup.  He is doing okay.  He is tired.  He has had no problem with infections.  He reports he is still drinking.  It is hard to say how much he is drinking, but it sounds like he might be drinking more than he might admit to.  He has had no bleeding.  There is some bruising.  He has had no change in bowel or bladder habits.  So far, our workup for him has been unremarkable.  We did find some iron-deficiency back in February with a ferritin of 25 and an iron saturation of 9%.  He does not have any issues with renal insufficiency.  He does have diabetes.  His erythropoietin level is 58.  He has a normal thyroid.  There have been no problems with nausea or vomiting.  He is not a vegetarian.  PHYSICAL EXAM:  General:  This is a well is a well-developed, well- nourished white gentleman in no obvious distress.  Vital Signs:  Show a temperature of 98.6, pulse 82, respiratory rate 18, blood pressure 124/67.  Weight is 243.  Head and Neck:  Show a normocephalic, atraumatic skull.  There are no ocular or oral lesions.  There are no palpable cervical or supraclavicular lymph nodes.  Lungs:  Clear bilaterally.  Cardiac:  Regular rate and rhythm with a normal S1 and S2. There are no murmurs, rubs, or bruits.  Abdomen:  Soft with good bowel sounds.  There is no palpable abdominal mass.  There is no fluid wave. There is no palpable hepatosplenomegaly.  Back:  Shows no tenderness over the spine, ribs, or hips.  Extremities:  Show no clubbing, cyanosis, or edema.  Neurological:  Shows no focal neurological deficits.  Skin:  No rashes, ecchymosis, or petechia.  LABORATORY STUDIES:  White cell count is 2.6.  Hemoglobin 10.9, hematocrit 33.8.  Platelet count is 72,000.  MCV is 90.  IMPRESSION:  Mr. Cloninger is a 47 year old gentleman with pancytopenia. This  does appear to be progressing.  Unfortunately, I think we have no choice now but to do a bone marrow test on him.  I forgot to mention that his folate and B12 levels were okay.  Again, it is possible that he may have some degree of alcohol toxicity of the bone marrow.  I would be surprised if he had any hematologic issue in the bone marrow. I looked at his blood smear.  I do not see anything with the blood smear that would look suspicious.  He does have some enlarged platelets. There are no immature myeloid cells.  There are no nucleated red cells. There are no target cells.  I see no rouleaux formation.  I talked to him and his wife.  I explained why I thought a bone marrow was indicated.  We will set the bone marrow up for August 12th.  I will plan to see him back probably about a week or so afterward.    ______________________________ Volanda Napoleon, M.D. PRE/MEDQ  D:  11/10/2012  T:  11/11/2012  Job:  [id number]

## 2012-11-13 LAB — IRON AND TIBC CHCC
Iron: 28 ug/dL — ABNORMAL LOW (ref 42–163)
TIBC: 436 ug/dL — ABNORMAL HIGH (ref 202–409)
UIBC: 408 ug/dL — ABNORMAL HIGH (ref 117–376)

## 2012-12-01 ENCOUNTER — Encounter (HOSPITAL_COMMUNITY): Payer: Self-pay | Admitting: Pharmacy Technician

## 2012-12-05 ENCOUNTER — Ambulatory Visit (HOSPITAL_COMMUNITY)
Admission: RE | Admit: 2012-12-05 | Discharge: 2012-12-05 | Disposition: A | Discharge status: Home / Self Care | Payer: BC Managed Care – PPO | Source: Ambulatory Visit | Attending: Hematology & Oncology | Admitting: Hematology & Oncology

## 2012-12-05 ENCOUNTER — Ambulatory Visit (HOSPITAL_BASED_OUTPATIENT_CLINIC_OR_DEPARTMENT_OTHER): Payer: BC Managed Care – PPO | Admitting: Hematology & Oncology

## 2012-12-05 ENCOUNTER — Encounter (HOSPITAL_COMMUNITY): Payer: Self-pay

## 2012-12-05 VITALS — BP 110/62 | HR 76 | Temp 98.0°F | Resp 20

## 2012-12-05 DIAGNOSIS — D72819 Decreased white blood cell count, unspecified: Secondary | ICD-10-CM | POA: Insufficient documentation

## 2012-12-05 DIAGNOSIS — D61818 Other pancytopenia: Secondary | ICD-10-CM

## 2012-12-05 DIAGNOSIS — D696 Thrombocytopenia, unspecified: Secondary | ICD-10-CM | POA: Insufficient documentation

## 2012-12-05 HISTORY — DX: Other pancytopenia: D61.818

## 2012-12-05 LAB — CBC WITH DIFFERENTIAL/PLATELET
Basophils Absolute: 0 10*3/uL (ref 0.0–0.1)
Eosinophils Relative: 2 % (ref 0–5)
HCT: 37.7 % — ABNORMAL LOW (ref 39.0–52.0)
Hemoglobin: 12.2 g/dL — ABNORMAL LOW (ref 13.0–17.0)
Lymphocytes Relative: 27 % (ref 12–46)
MCV: 87.3 fL (ref 78.0–100.0)
Monocytes Absolute: 0.3 10*3/uL (ref 0.1–1.0)
Monocytes Relative: 11 % (ref 3–12)
RDW: 15.6 % — ABNORMAL HIGH (ref 11.5–15.5)
WBC: 2.5 10*3/uL — ABNORMAL LOW (ref 4.0–10.5)

## 2012-12-05 MED ORDER — MIDAZOLAM HCL 10 MG/2ML IJ SOLN
INTRAMUSCULAR | Status: AC
  Filled 2012-12-05: qty 2

## 2012-12-05 MED ORDER — MIDAZOLAM HCL 2 MG/2ML IJ SOLN
INTRAMUSCULAR | Status: AC | PRN
  Administered 2012-12-05: 5 mg via INTRAVENOUS
  Administered 2012-12-05: 1 mg via INTRAVENOUS

## 2012-12-05 MED ORDER — MEPERIDINE HCL 25 MG/ML IJ SOLN
INTRAMUSCULAR | Status: AC | PRN
  Administered 2012-12-05: 50 mg via INTRAVENOUS

## 2012-12-05 MED ORDER — MEPERIDINE HCL 50 MG/ML IJ SOLN
INTRAMUSCULAR | Status: AC
  Filled 2012-12-05: qty 1

## 2012-12-05 MED ORDER — MEPERIDINE HCL 50 MG/ML IJ SOLN
50.0000 mg | Freq: Once | INTRAMUSCULAR | Status: AC
  Administered 2012-12-05: 50 mg via INTRAVENOUS

## 2012-12-05 MED ORDER — SODIUM CHLORIDE 0.9 % IV SOLN
INTRAVENOUS | Status: DC
  Administered 2012-12-05: 20 mL/h via INTRAVENOUS

## 2012-12-05 MED ORDER — MIDAZOLAM HCL 10 MG/2ML IJ SOLN
10.0000 mg | Freq: Once | INTRAMUSCULAR | Status: AC
  Administered 2012-12-05: 10 mg via INTRAVENOUS

## 2012-12-05 NOTE — Progress Notes (Signed)
This office note has been dictated.

## 2012-12-06 NOTE — Procedures (Signed)
Mr. Amborn was brought into the short stay unit at [facility].  He had a bone marrow biopsy done for leukopenia and thrombocytopenia.  We did the appropriate time-out procedure.  His Mallampati score was 1.  His ASA class was 1.  We then turned him onto his right side.  He already had an IV placed peripherally without difficulty.  He received a total of 6 mg of Versed and 50 mg of Demerol for IV sedation.  The left posterior iliac crest region was prepped and draped in a sterile fashion.  10 cc of 2% lidocaine was infiltrated under the skin down to the periosteum.  A #11 scalpel was used to make an incision into the skin.  Two bone marrow aspirates were obtained without difficulty.  The patient tolerated the procedure well.  There were no complications.  We cleaned and dressed the incision site sterilely.    ______________________________ Volanda Napoleon, M.D. PRE/MEDQ  D:  12/05/2012  T:  12/06/2012  Job:  [id number]

## 2012-12-12 ENCOUNTER — Telehealth: Payer: Self-pay | Admitting: Hematology & Oncology

## 2012-12-12 NOTE — Telephone Encounter (Signed)
I left a message on the phone about the bone marrow report.  Nothing serious, but looks like the effects of EtOH are weakening the marrow.  NO need to treat right now. There is no iron in the marrow.  If there are any questions, please call me.  Laurey Arrow

## 2012-12-13 LAB — CHROMOSOME ANALYSIS, BONE MARROW

## 2012-12-18 ENCOUNTER — Ambulatory Visit (HOSPITAL_BASED_OUTPATIENT_CLINIC_OR_DEPARTMENT_OTHER): Payer: BC Managed Care – PPO | Admitting: Hematology & Oncology

## 2012-12-18 ENCOUNTER — Other Ambulatory Visit (HOSPITAL_BASED_OUTPATIENT_CLINIC_OR_DEPARTMENT_OTHER): Payer: BC Managed Care – PPO | Admitting: Lab

## 2012-12-18 ENCOUNTER — Other Ambulatory Visit: Payer: Self-pay

## 2012-12-18 VITALS — BP 134/72 | HR 82 | Temp 98.7°F | Resp 20 | Ht 67.0 in | Wt 247.0 lb

## 2012-12-18 DIAGNOSIS — F1010 Alcohol abuse, uncomplicated: Secondary | ICD-10-CM

## 2012-12-18 DIAGNOSIS — D509 Iron deficiency anemia, unspecified: Secondary | ICD-10-CM

## 2012-12-18 DIAGNOSIS — K760 Fatty (change of) liver, not elsewhere classified: Secondary | ICD-10-CM

## 2012-12-18 DIAGNOSIS — D61818 Other pancytopenia: Secondary | ICD-10-CM

## 2012-12-18 DIAGNOSIS — R161 Splenomegaly, not elsewhere classified: Secondary | ICD-10-CM

## 2012-12-18 LAB — CBC WITH DIFFERENTIAL (CANCER CENTER ONLY)
BASO#: 0 10*3/uL (ref 0.0–0.2)
Eosinophils Absolute: 0.1 10*3/uL (ref 0.0–0.5)
HCT: 34.7 % — ABNORMAL LOW (ref 38.7–49.9)
HGB: 11.1 g/dL — ABNORMAL LOW (ref 13.0–17.1)
LYMPH#: 0.6 10*3/uL — ABNORMAL LOW (ref 0.9–3.3)
LYMPH%: 17.5 % (ref 14.0–48.0)
MCV: 89 fL (ref 82–98)
MONO#: 0.4 10*3/uL (ref 0.1–0.9)
NEUT%: 69.5 % (ref 40.0–80.0)
RBC: 3.88 10*6/uL — ABNORMAL LOW (ref 4.20–5.70)
RDW: 14.9 % (ref 11.1–15.7)
WBC: 3.6 10*3/uL — ABNORMAL LOW (ref 4.0–10.0)

## 2012-12-18 MED ORDER — LISINOPRIL 20 MG PO TABS: 20.0000 mg | ORAL_TABLET | Freq: Every morning | ORAL | Status: DC

## 2012-12-18 MED ORDER — ATENOLOL 50 MG PO TABS: 50.0000 mg | ORAL_TABLET | Freq: Two times a day (BID) | ORAL | Status: DC

## 2012-12-18 MED ORDER — HYDROCHLOROTHIAZIDE 25 MG PO TABS: 25.0000 mg | ORAL_TABLET | Freq: Every morning | ORAL | Status: DC

## 2012-12-18 NOTE — Progress Notes (Signed)
This office note has been dictated.

## 2012-12-19 NOTE — Progress Notes (Signed)
CC:   [name], M.D. Jeryl Columbia, M.D.  DIAGNOSES: 1. Iron-deficiency anemia. 2. Leukopenia/thrombocytopenia, likely secondary to marrow toxicity     from alcohol/splenomegaly.  CURRENT THERAPY:  Patient to received IV iron next week.  INTERIM HISTORY:  Mr. Jowers comes in for followup.  We went ahead and did do a bone marrow biopsy on him.  This was done on August 12th. The bone marrow report ([id number]) shows a hypocellular marrow with myeloid hyperplasia and mild megakaryocytic hyperplasia.  There is absent iron in the marrow.  We did do iron studies on him.  On 07/18, his ferritin was 20 with an iron saturation of only 6.  His B12 was 452.  His erythropoietin level was 58.  He had a low reticulocyte count.  I believe that some of the issues that he has with his blood are a direct marrow toxic effect of the alcohol.  I told him that he really has to stop or else he do develop permanent problems and, more importantly, likely will develop cirrhosis that is not reversible.  I think he probably needs to have a CT of the abdomen and pelvis.  He may have varices.  He has had no cough.  He has had no rashes.  He has had no headache.  PHYSICAL EXAMINATION:  General:  This is a mildly obese white gentleman in no obvious distress.  Vital signs:  Temperature of 98.7, pulse 82, respiratory rate 20, blood pressure 134/72.  Weight is 247.  Head and neck:  Normocephalic, atraumatic skull.  There are no ocular or oral lesions.  There are no palpable cervical or supraclavicular lymph nodes. Lungs:  Clear bilaterally.  Cardiac:  Regular rate and rhythm with a normal S1 and S2.  There are no murmurs, rubs or bruits.  Abdomen: Soft.  He has good bowel sounds.  His liver edge is palpable at the right costal margin.  Spleen tip is palpable with deep inspiration. Extremities:  No clubbing, cyanosis or edema.  Neurologic:  No focal neurological deficits.  LABORATORY STUDIES:  White  cell count is 3.6, hemoglobin 11.1, hematocrit 34.7, platelet count 83,000.  MCV is 89.  IMPRESSION:  Mr. Bartow is a 47 year old gentleman with pancytopenia. I think this is going to be multifactorial.  Ultimately, I think that we might be looking at alcohol affects both directly on the bone marrow and indirectly with respect to cirrhosis and splenomegaly.  I spoke to him.  His wife is really proactive and, hopefully, she will try to get him to stop drinking.  I did speak with Dr. Watt Climes of gastroenterology.  He will see Mr. Henricks probably in a couple of weeks to consider endoscopy looking for any type of variceal bleeding potential.  We will set him up iron and a CT scan next week.  I want see Mr. Selman in about 6 weeks.  I spent a good hour with him and his wife today explaining the situation.  His wife clearly understands the severity of what we are dealing with.    ______________________________ Volanda Napoleon, M.D. PRE/MEDQ  D:  12/18/2012  T:  12/19/2012  Job:  [id number]

## 2012-12-20 ENCOUNTER — Encounter: Payer: Self-pay | Admitting: Hematology & Oncology

## 2012-12-28 ENCOUNTER — Other Ambulatory Visit (HOSPITAL_BASED_OUTPATIENT_CLINIC_OR_DEPARTMENT_OTHER): Payer: BC Managed Care – PPO | Admitting: Lab

## 2012-12-28 ENCOUNTER — Encounter (HOSPITAL_BASED_OUTPATIENT_CLINIC_OR_DEPARTMENT_OTHER): Payer: Self-pay

## 2012-12-28 ENCOUNTER — Ambulatory Visit (HOSPITAL_BASED_OUTPATIENT_CLINIC_OR_DEPARTMENT_OTHER)
Admission: RE | Admit: 2012-12-28 | Discharge: 2012-12-28 | Disposition: A | Discharge status: Home / Self Care | Payer: BC Managed Care – PPO | Source: Ambulatory Visit | Attending: Hematology & Oncology | Admitting: Hematology & Oncology

## 2012-12-28 ENCOUNTER — Ambulatory Visit (HOSPITAL_BASED_OUTPATIENT_CLINIC_OR_DEPARTMENT_OTHER): Payer: BC Managed Care – PPO

## 2012-12-28 VITALS — BP 119/68 | HR 81 | Temp 97.0°F | Resp 20

## 2012-12-28 DIAGNOSIS — R161 Splenomegaly, not elsewhere classified: Secondary | ICD-10-CM | POA: Insufficient documentation

## 2012-12-28 DIAGNOSIS — D509 Iron deficiency anemia, unspecified: Secondary | ICD-10-CM

## 2012-12-28 DIAGNOSIS — D61818 Other pancytopenia: Secondary | ICD-10-CM

## 2012-12-28 DIAGNOSIS — K7689 Other specified diseases of liver: Secondary | ICD-10-CM | POA: Insufficient documentation

## 2012-12-28 DIAGNOSIS — D649 Anemia, unspecified: Secondary | ICD-10-CM

## 2012-12-28 DIAGNOSIS — K760 Fatty (change of) liver, not elsewhere classified: Secondary | ICD-10-CM

## 2012-12-28 DIAGNOSIS — K7460 Unspecified cirrhosis of liver: Secondary | ICD-10-CM | POA: Insufficient documentation

## 2012-12-28 LAB — CBC WITH DIFFERENTIAL (CANCER CENTER ONLY)
BASO#: 0 10*3/uL (ref 0.0–0.2)
EOS%: 2.4 % (ref 0.0–7.0)
Eosinophils Absolute: 0.1 10*3/uL (ref 0.0–0.5)
HCT: 35.6 % — ABNORMAL LOW (ref 38.7–49.9)
HGB: 11.6 g/dL — ABNORMAL LOW (ref 13.0–17.1)
MCH: 28.6 pg (ref 28.0–33.4)
MCHC: 32.6 g/dL (ref 32.0–35.9)
MCV: 88 fL (ref 82–98)
MONO%: 9.1 % (ref 0.0–13.0)
NEUT#: 1.7 10*3/uL (ref 1.5–6.5)
NEUT%: 65.7 % (ref 40.0–80.0)
RBC: 4.05 10*6/uL — ABNORMAL LOW (ref 4.20–5.70)

## 2012-12-28 LAB — CMP (CANCER CENTER ONLY)
AST: 119 U/L — ABNORMAL HIGH (ref 11–38)
Albumin: 3.6 g/dL (ref 3.3–5.5)
Alkaline Phosphatase: 124 U/L — ABNORMAL HIGH (ref 26–84)
BUN, Bld: 8 mg/dL (ref 7–22)
Calcium: 9.1 mg/dL (ref 8.0–10.3)
Creat: 0.4 mg/dl — ABNORMAL LOW (ref 0.6–1.2)
Glucose, Bld: 273 mg/dL — ABNORMAL HIGH (ref 73–118)
Potassium: 4.4 mEq/L (ref 3.3–4.7)

## 2012-12-28 MED ORDER — IOHEXOL 300 MG/ML  SOLN
100.0000 mL | Freq: Once | INTRAMUSCULAR | Status: AC | PRN
  Administered 2012-12-28: 100 mL via INTRAVENOUS

## 2012-12-28 MED ORDER — SODIUM CHLORIDE 0.9 % IV SOLN
Freq: Once | INTRAVENOUS | Status: AC
  Administered 2012-12-28: 14:00:00 via INTRAVENOUS

## 2012-12-28 MED ORDER — SODIUM CHLORIDE 0.9 % IV SOLN
1020.0000 mg | Freq: Once | INTRAVENOUS | Status: AC
  Administered 2012-12-28: 1020 mg via INTRAVENOUS
  Filled 2012-12-28: qty 34

## 2012-12-28 NOTE — Patient Instructions (Addendum)
Ferumoxytol injection What is this medicine? FERUMOXYTOL is an iron complex. Iron is used to make healthy red blood cells, which carry oxygen and nutrients throughout the body. This medicine is used to treat iron deficiency anemia in people with chronic kidney disease. This medicine may be used for other purposes; ask your health care provider or pharmacist if you have questions. What should I tell my health care provider before I take this medicine? They need to know if you have any of these conditions: -anemia not caused by low iron levels -high levels of iron in the blood -magnetic resonance imaging (MRI) test scheduled -an unusual or allergic reaction to iron, other medicines, foods, dyes, or preservatives -pregnant or trying to get pregnant -breast-feeding How should I use this medicine? This medicine is for infusion into a vein. It is given by a health care professional in a hospital or clinic setting. Talk to your pediatrician regarding the use of this medicine in children. Special care may be needed. Overdosage: If you think you've taken too much of this medicine contact a poison control center or emergency room at once. Overdosage: If you think you have taken too much of this medicine contact a poison control center or emergency room at once. NOTE: This medicine is only for you. Do not share this medicine with others. What if I miss a dose? It is important not to miss your dose. Call your doctor or health care professional if you are unable to keep an appointment. What may interact with this medicine? This medicine may interact with the following medications: -other iron products This list may not describe all possible interactions. Give your health care provider a list of all the medicines, herbs, non-prescription drugs, or dietary supplements you use. Also tell them if you smoke, drink alcohol, or use illegal drugs. Some items may interact with your medicine. What should I watch  for while using this medicine? Visit your doctor or healthcare professional regularly. Tell your doctor or healthcare professional if your symptoms do not start to get better or if they get worse. You may need blood work done while you are taking this medicine. You may need to follow a special diet. Talk to your doctor. Foods that contain iron include: whole grains/cereals, dried fruits, beans, or peas, leafy green vegetables, and organ meats (liver, kidney). What side effects may I notice from receiving this medicine? Side effects that you should report to your doctor or health care professional as soon as possible: -allergic reactions like skin rash, itching or hives, swelling of the face, lips, or tongue -breathing problems -changes in blood pressure -feeling faint or lightheaded, falls -fever or chills -flushing, sweating, or hot feelings -swelling of the ankles or feet Side effects that usually do not require medical attention (Report these to your doctor or health care professional if they continue or are bothersome.): -diarrhea -headache -nausea, vomiting -stomach pain This list may not describe all possible side effects. Call your doctor for medical advice about side effects. You may report side effects to FDA at 1-800-FDA-1088. Where should I keep my medicine? This drug is given in a hospital or clinic and will not be stored at home. NOTE: This sheet is a summary. It may not cover all possible information. If you have questions about this medicine, talk to your doctor, pharmacist, or health care provider.  2013, [employer]. (01/03/2008 9:48:25 PM)  

## 2013-01-25 ENCOUNTER — Encounter (HOSPITAL_COMMUNITY): Payer: Self-pay | Admitting: *Deleted

## 2013-01-26 ENCOUNTER — Encounter (HOSPITAL_COMMUNITY): Payer: Self-pay | Admitting: Pharmacy Technician

## 2013-01-29 ENCOUNTER — Telehealth: Payer: Self-pay | Admitting: Hematology & Oncology

## 2013-01-29 NOTE — Telephone Encounter (Signed)
Left message moved 10-8 to 10-13

## 2013-01-31 ENCOUNTER — Ambulatory Visit: Payer: BC Managed Care – PPO | Admitting: Hematology & Oncology

## 2013-01-31 ENCOUNTER — Other Ambulatory Visit: Payer: BC Managed Care – PPO | Admitting: Lab

## 2013-02-05 ENCOUNTER — Ambulatory Visit (HOSPITAL_BASED_OUTPATIENT_CLINIC_OR_DEPARTMENT_OTHER): Payer: BC Managed Care – PPO | Admitting: Hematology & Oncology

## 2013-02-05 ENCOUNTER — Other Ambulatory Visit: Payer: Self-pay | Admitting: *Deleted

## 2013-02-05 ENCOUNTER — Other Ambulatory Visit (HOSPITAL_BASED_OUTPATIENT_CLINIC_OR_DEPARTMENT_OTHER): Payer: BC Managed Care – PPO | Admitting: Lab

## 2013-02-05 VITALS — BP 129/66 | HR 74 | Temp 98.2°F | Resp 14 | Ht 67.0 in | Wt 240.0 lb

## 2013-02-05 DIAGNOSIS — D61818 Other pancytopenia: Secondary | ICD-10-CM

## 2013-02-05 DIAGNOSIS — R748 Abnormal levels of other serum enzymes: Secondary | ICD-10-CM

## 2013-02-05 DIAGNOSIS — D72819 Decreased white blood cell count, unspecified: Secondary | ICD-10-CM

## 2013-02-05 DIAGNOSIS — D696 Thrombocytopenia, unspecified: Secondary | ICD-10-CM

## 2013-02-05 DIAGNOSIS — D649 Anemia, unspecified: Secondary | ICD-10-CM

## 2013-02-05 DIAGNOSIS — D509 Iron deficiency anemia, unspecified: Secondary | ICD-10-CM

## 2013-02-05 DIAGNOSIS — F1010 Alcohol abuse, uncomplicated: Secondary | ICD-10-CM

## 2013-02-05 LAB — CBC WITH DIFFERENTIAL (CANCER CENTER ONLY)
BASO#: 0 10*3/uL (ref 0.0–0.2)
Eosinophils Absolute: 0.1 10*3/uL (ref 0.0–0.5)
HCT: 39 % (ref 38.7–49.9)
HGB: 13.5 g/dL (ref 13.0–17.1)
LYMPH#: 0.6 10*3/uL — ABNORMAL LOW (ref 0.9–3.3)
MCH: 32.9 pg (ref 28.0–33.4)
NEUT#: 1.4 10*3/uL — ABNORMAL LOW (ref 1.5–6.5)
NEUT%: 58.2 % (ref 40.0–80.0)
RBC: 4.1 10*6/uL — ABNORMAL LOW (ref 4.20–5.70)

## 2013-02-05 NOTE — Progress Notes (Signed)
This office note has been dictated.

## 2013-02-06 LAB — IRON AND TIBC CHCC
%SAT: 36 % (ref 20–55)
Iron: 153 ug/dL (ref 42–163)
TIBC: 424 ug/dL — ABNORMAL HIGH (ref 202–409)
UIBC: 271 ug/dL (ref 117–376)

## 2013-02-06 LAB — COMPREHENSIVE METABOLIC PANEL
Albumin: 3.7 g/dL (ref 3.5–5.2)
BUN: 13 mg/dL (ref 6–23)
CO2: 26 mEq/L (ref 19–32)
Calcium: 9 mg/dL (ref 8.4–10.5)
Chloride: 94 mEq/L — ABNORMAL LOW (ref 96–112)
Creatinine, Ser: 0.8 mg/dL (ref 0.50–1.35)
Glucose, Bld: 486 mg/dL — ABNORMAL HIGH (ref 70–99)
Potassium: 4.1 mEq/L (ref 3.5–5.3)

## 2013-02-06 NOTE — Progress Notes (Signed)
CC:   [name], M.D. Oscar [name], M.D.  DIAGNOSES: 1. Iron-deficiency anemia. 2. Leukopenia/thrombocytopenia. 3. Cirrhosis/splenomegaly/alcohol use.  CURRENT THERAPY:  Patient is status post 1 dose of IV iron in September.  INTERIM HISTORY:  Oscar [name] comes in for followup.  He is doing okay.  He is still drinking.  He is honest about this.  We did go ahead and do scans on him.  He had CT scans that were done back on September 4th.  The CT scan showed splenomegaly of 15 cm.  He had hepatic steatosis with moderate cirrhosis.  There were small gastroesophageal and gastrosplenic varices.  He had a varices along the anterior abdominal wall musculature.  There was no ascites.  He is going for a colonoscopy in 2 days.  He did well with the IV iron.  He got Feraheme at a dose of 1020 mg.  He has had no obvious bleeding or bruising.  He has had no nausea or vomiting.  He has had no obvious change in bowel or bladder habits.  PHYSICAL EXAMINATION:  General:  This is a somewhat obese white gentleman in no obvious distress.  Vital Signs:  Temperature of 98.2, pulse 74, respiratory rate 14, blood pressure 129/66.  Weight is 240 pounds.  Head and Neck:  Normocephalic, atraumatic skull.  There are no ocular or oral lesions.  There are no palpable cervical or supraclavicular lymph nodes.  Lungs:  Clear to percussion and auscultation bilaterally.  Cardiac:  Regular rate and rhythm with a normal S1 and S2.  There are no murmurs, rubs or bruits.  Abdomen: Soft.  He has good bowel sounds.  There is no palpable abdominal mass. There is no fluid wave.  There is no palpable hepatosplenomegaly. Extremities:  Show no clubbing, cyanosis or edema.  Skin:  Shows no rashes, ecchymosis, or petechia.  Neurological:  Shows no focal neurological deficits.  LABORATORY STUDIES:  White cell count is 2.3, hemoglobin 13.5, hematocrit 39, platelet count 62,000.  MCV is 95.  Peripheral smear, which I  reviewed, shows a normochromic, normocytic population of red blood cells.  He has no nucleated red blood cells. There may be a couple of target cells.  There is no rouleaux formation. His white cells are decreased in number.  They are mature.  There are no hypersegmented polys.  There are no blasts.  Platelets are adequate in size.  He does have decrease in the number of platelets.  He does have large platelets.  IMPRESSION: 1. Oscar [name] is a nice 47 year old gentleman with cirrhosis and     splenomegaly.  I still believe this is the major cause for him to     have the leukopenia and thrombocytopenia.  I suppose there is also     the possibility of a marrow suppression from alcohol intake. 2. The iron deficiency was resolved.  We took care of this with the IV     iron.  With his varices, he certainly could bleed again, and might     need more iron down the road. 3. We will go ahead and plan to get him back in 3 months now.  I again     talked to at length about alcohol use and trying to minimize this.     He definitely understands this.    ______________________________ Volanda Napoleon, M.D. PRE/MEDQ  D:  02/05/2013  T:  02/06/2013  Job:  [id number]

## 2013-02-07 ENCOUNTER — Encounter (HOSPITAL_COMMUNITY): Payer: Self-pay | Admitting: Anesthesiology

## 2013-02-07 ENCOUNTER — Encounter (HOSPITAL_COMMUNITY): Payer: BC Managed Care – PPO | Admitting: Anesthesiology

## 2013-02-07 ENCOUNTER — Encounter (HOSPITAL_COMMUNITY)
Admission: RE | Disposition: A | Discharge status: Home / Self Care | Payer: BC Managed Care – PPO | Source: Ambulatory Visit | Attending: Gastroenterology

## 2013-02-07 ENCOUNTER — Ambulatory Visit (HOSPITAL_COMMUNITY): Payer: BC Managed Care – PPO | Admitting: Anesthesiology

## 2013-02-07 ENCOUNTER — Inpatient Hospital Stay (HOSPITAL_COMMUNITY)
Admission: RE | Admit: 2013-02-07 | Discharge: 2013-02-08 | DRG: 920 | Disposition: A | Discharge status: Home / Self Care | Payer: BC Managed Care – PPO | Source: Ambulatory Visit | Attending: Gastroenterology | Admitting: Gastroenterology

## 2013-02-07 DIAGNOSIS — F1090 Alcohol use, unspecified, uncomplicated: Secondary | ICD-10-CM

## 2013-02-07 DIAGNOSIS — Z6837 Body mass index (BMI) 37.0-37.9, adult: Secondary | ICD-10-CM

## 2013-02-07 DIAGNOSIS — R748 Abnormal levels of other serum enzymes: Secondary | ICD-10-CM | POA: Diagnosis present

## 2013-02-07 DIAGNOSIS — K922 Gastrointestinal hemorrhage, unspecified: Secondary | ICD-10-CM

## 2013-02-07 DIAGNOSIS — Z7289 Other problems related to lifestyle: Secondary | ICD-10-CM

## 2013-02-07 DIAGNOSIS — K219 Gastro-esophageal reflux disease without esophagitis: Secondary | ICD-10-CM | POA: Diagnosis present

## 2013-02-07 DIAGNOSIS — I10 Essential (primary) hypertension: Secondary | ICD-10-CM | POA: Diagnosis present

## 2013-02-07 DIAGNOSIS — E871 Hypo-osmolality and hyponatremia: Secondary | ICD-10-CM | POA: Diagnosis present

## 2013-02-07 DIAGNOSIS — K649 Unspecified hemorrhoids: Secondary | ICD-10-CM | POA: Diagnosis present

## 2013-02-07 DIAGNOSIS — K766 Portal hypertension: Secondary | ICD-10-CM | POA: Diagnosis present

## 2013-02-07 DIAGNOSIS — E785 Hyperlipidemia, unspecified: Secondary | ICD-10-CM | POA: Diagnosis present

## 2013-02-07 DIAGNOSIS — Y849 Medical procedure, unspecified as the cause of abnormal reaction of the patient, or of later complication, without mention of misadventure at the time of the procedure: Secondary | ICD-10-CM | POA: Diagnosis present

## 2013-02-07 DIAGNOSIS — K7469 Other cirrhosis of liver: Secondary | ICD-10-CM | POA: Diagnosis present

## 2013-02-07 DIAGNOSIS — E119 Type 2 diabetes mellitus without complications: Secondary | ICD-10-CM | POA: Diagnosis present

## 2013-02-07 DIAGNOSIS — D509 Iron deficiency anemia, unspecified: Secondary | ICD-10-CM | POA: Diagnosis present

## 2013-02-07 DIAGNOSIS — R109 Unspecified abdominal pain: Secondary | ICD-10-CM | POA: Diagnosis present

## 2013-02-07 DIAGNOSIS — R112 Nausea with vomiting, unspecified: Secondary | ICD-10-CM | POA: Diagnosis present

## 2013-02-07 DIAGNOSIS — K319 Disease of stomach and duodenum, unspecified: Secondary | ICD-10-CM | POA: Diagnosis present

## 2013-02-07 DIAGNOSIS — I8510 Secondary esophageal varices without bleeding: Secondary | ICD-10-CM | POA: Diagnosis present

## 2013-02-07 DIAGNOSIS — D61818 Other pancytopenia: Secondary | ICD-10-CM | POA: Diagnosis present

## 2013-02-07 DIAGNOSIS — D126 Benign neoplasm of colon, unspecified: Secondary | ICD-10-CM | POA: Diagnosis present

## 2013-02-07 DIAGNOSIS — IMO0002 Reserved for concepts with insufficient information to code with codable children: Principal | ICD-10-CM | POA: Diagnosis present

## 2013-02-07 HISTORY — PX: ESOPHAGOGASTRODUODENOSCOPY (EGD) WITH PROPOFOL: SHX5813

## 2013-02-07 HISTORY — PX: COLONOSCOPY WITH PROPOFOL: SHX5780

## 2013-02-07 LAB — GLUCOSE, CAPILLARY
Glucose-Capillary: 253 mg/dL — ABNORMAL HIGH (ref 70–99)
Glucose-Capillary: 234 mg/dL — ABNORMAL HIGH (ref 70–99)
Glucose-Capillary: 181 mg/dL — ABNORMAL HIGH (ref 70–99)

## 2013-02-07 LAB — COMPREHENSIVE METABOLIC PANEL
Alkaline Phosphatase: 97 U/L (ref 39–117)
BUN: 6 mg/dL (ref 6–23)
CO2: 24 mEq/L (ref 19–32)
Chloride: 99 mEq/L (ref 96–112)
GFR calc Af Amer: >90 mL/min (ref >90)
Glucose, Bld: 252 mg/dL — ABNORMAL HIGH (ref 70–99)
Potassium: 3.9 mEq/L (ref 3.5–5.1)
Total Bilirubin: 1.5 mg/dL — ABNORMAL HIGH (ref 0.3–1.2)

## 2013-02-07 LAB — CBC
MCH: 32.7 pg (ref 26.0–34.0)
Platelets: 53 10*3/uL — ABNORMAL LOW (ref 150–400)
RBC: 4.07 MIL/uL — ABNORMAL LOW (ref 4.22–5.81)
RDW: 17.4 % — ABNORMAL HIGH (ref 11.5–15.5)
WBC: 2.2 10*3/uL — ABNORMAL LOW (ref 4.0–10.5)

## 2013-02-07 LAB — MRSA PCR SCREENING: MRSA by PCR: NEGATIVE

## 2013-02-07 LAB — PROTIME-INR: INR: 1.33 (ref 0.00–1.49)

## 2013-02-07 SURGERY — ESOPHAGOGASTRODUODENOSCOPY (EGD) WITH PROPOFOL
Anesthesia: Monitor Anesthesia Care

## 2013-02-07 MED ORDER — OXYCODONE HCL 5 MG PO TABS: 5.0000 mg | ORAL_TABLET | ORAL | Status: DC | PRN

## 2013-02-07 MED ORDER — KETAMINE HCL 50 MG/ML IJ SOLN
INTRAMUSCULAR | Status: DC | PRN
  Administered 2013-02-07: 25 mg via INTRAMUSCULAR

## 2013-02-07 MED ORDER — ATENOLOL 50 MG PO TABS
50.0000 mg | ORAL_TABLET | Freq: Every morning | ORAL | Status: DC
  Administered 2013-02-08: 50 mg via ORAL
  Filled 2013-02-07: qty 1

## 2013-02-07 MED ORDER — SODIUM CHLORIDE 0.9 % IJ SOLN
PREFILLED_SYRINGE | INTRAMUSCULAR | Status: DC | PRN
  Administered 2013-02-07: 11:00:00

## 2013-02-07 MED ORDER — PANTOPRAZOLE SODIUM 40 MG PO TBEC
40.0000 mg | DELAYED_RELEASE_TABLET | Freq: Every day | ORAL | Status: DC
  Administered 2013-02-08: 40 mg via ORAL
  Filled 2013-02-07 (×2): qty 1

## 2013-02-07 MED ORDER — HYDROCHLOROTHIAZIDE 25 MG PO TABS
25.0000 mg | ORAL_TABLET | Freq: Every morning | ORAL | Status: DC
  Administered 2013-02-07 – 2013-02-08 (×2): 25 mg via ORAL
  Filled 2013-02-07 (×2): qty 1

## 2013-02-07 MED ORDER — INSULIN NPH (HUMAN) (ISOPHANE) 100 UNIT/ML ~~LOC~~ SUSP
5.0000 [IU] | Freq: Every day | SUBCUTANEOUS | Status: DC
  Administered 2013-02-07: 5 [IU] via SUBCUTANEOUS
  Filled 2013-02-07: qty 10

## 2013-02-07 MED ORDER — LACTATED RINGERS IV SOLN
INTRAVENOUS | Status: DC
  Administered 2013-02-07: 09:00:00 via INTRAVENOUS

## 2013-02-07 MED ORDER — LISINOPRIL 20 MG PO TABS
20.0000 mg | ORAL_TABLET | Freq: Every morning | ORAL | Status: DC
  Administered 2013-02-07 – 2013-02-08 (×2): 20 mg via ORAL
  Filled 2013-02-07 (×2): qty 1

## 2013-02-07 MED ORDER — EPINEPHRINE HCL 0.1 MG/ML IJ SOSY
PREFILLED_SYRINGE | INTRAMUSCULAR | Status: AC
  Filled 2013-02-07: qty 10

## 2013-02-07 MED ORDER — SODIUM CHLORIDE 0.9 % IV SOLN: INTRAVENOUS | Status: DC

## 2013-02-07 MED ORDER — PROPOFOL INFUSION 10 MG/ML OPTIME
INTRAVENOUS | Status: DC | PRN
  Administered 2013-02-07: 100 ug/kg/min via INTRAVENOUS

## 2013-02-07 MED ORDER — BUTAMBEN-TETRACAINE-BENZOCAINE 2-2-14 % EX AERO
INHALATION_SPRAY | CUTANEOUS | Status: DC | PRN
  Administered 2013-02-07: 2 via TOPICAL

## 2013-02-07 MED ORDER — PROMETHAZINE HCL 25 MG/ML IJ SOLN: 6.2500 mg | INTRAMUSCULAR | Status: DC | PRN

## 2013-02-07 MED ORDER — SODIUM CHLORIDE 0.9 % IV SOLN
INTRAVENOUS | Status: DC
  Administered 2013-02-07: 16:00:00 via INTRAVENOUS

## 2013-02-07 MED ORDER — LACTATED RINGERS IV SOLN
INTRAVENOUS | Status: DC | PRN
  Administered 2013-02-07: 09:00:00 via INTRAVENOUS

## 2013-02-07 MED ORDER — ZOLPIDEM TARTRATE 5 MG PO TABS: 5.0000 mg | ORAL_TABLET | Freq: Every evening | ORAL | Status: DC | PRN

## 2013-02-07 MED ORDER — INSULIN ASPART 100 UNIT/ML ~~LOC~~ SOLN
0.0000 [IU] | SUBCUTANEOUS | Status: DC
  Administered 2013-02-07: 8 [IU] via SUBCUTANEOUS
  Administered 2013-02-07: 3 [IU] via SUBCUTANEOUS
  Administered 2013-02-07 (×2): 5 [IU] via SUBCUTANEOUS
  Administered 2013-02-07 – 2013-02-08 (×3): 3 [IU] via SUBCUTANEOUS
  Filled 2013-02-07 (×2): qty 0.15

## 2013-02-07 MED ORDER — ONDANSETRON HCL 4 MG PO TABS
4.0000 mg | ORAL_TABLET | Freq: Four times a day (QID) | ORAL | Status: DC | PRN
  Filled 2013-02-07: qty 1

## 2013-02-07 MED ORDER — SODIUM CHLORIDE 0.9 % IJ SOLN
3.0000 mL | Freq: Two times a day (BID) | INTRAMUSCULAR | Status: DC
  Administered 2013-02-07 – 2013-02-08 (×3): 3 mL via INTRAVENOUS

## 2013-02-07 MED ORDER — ONDANSETRON HCL 4 MG/2ML IJ SOLN: 4.0000 mg | Freq: Four times a day (QID) | INTRAMUSCULAR | Status: DC | PRN

## 2013-02-07 SURGICAL SUPPLY — 24 items

## 2013-02-07 NOTE — Transfer of Care (Signed)
Immediate Anesthesia Transfer of Care Note  Patient: Oscar [name]  Procedure(s) Performed: Procedure(s): ESOPHAGOGASTRODUODENOSCOPY (EGD) WITH PROPOFOL (N/A) COLONOSCOPY WITH PROPOFOL (N/A)  Patient Location: PACU  Anesthesia Type:MAC  Level of Consciousness: awake, alert  and oriented  Airway & Oxygen Therapy: Patient Spontanous Breathing and Patient connected to nasal cannula oxygen  Post-op Assessment: Report given to PACU RN, Post -op Vital signs reviewed and stable and Patient moving all extremities X 4  Post vital signs: Reviewed and stable  Complications: No apparent anesthesia complications

## 2013-02-07 NOTE — Anesthesia Preprocedure Evaluation (Addendum)
Anesthesia Evaluation  Patient identified by MRN, date of birth, ID band Patient awake    Reviewed: Allergy & Precautions, H&P , NPO status , Patient's Chart, lab work & pertinent test results  Airway       Dental   Pulmonary neg pulmonary ROS,          Cardiovascular hypertension, Pt. on medications and Pt. on home beta blockers negative cardio ROS      Neuro/Psych negative neurological ROS  negative psych ROS   GI/Hepatic negative GI ROS, Neg liver ROS, GERD-  ,  Endo/Other  diabetes, Type 1, Insulin Dependent and Oral Hypoglycemic AgentsMorbid obesity  Renal/GU negative Renal ROS  negative genitourinary   Musculoskeletal negative musculoskeletal ROS (+)   Abdominal   Peds  Hematology negative hematology ROS (+)   Anesthesia Other Findings   Reproductive/Obstetrics negative OB ROS                           Anesthesia Physical Anesthesia Plan  ASA: III  Anesthesia Plan: MAC   Post-op Pain Management:    Induction: Intravenous  Airway Management Planned: Nasal Cannula  Additional Equipment:   Intra-op Plan:   Post-operative Plan:   Informed Consent: I have reviewed the patients History and Physical, chart, labs and discussed the procedure including the risks, benefits and alternatives for the proposed anesthesia with the patient or authorized representative who has indicated his/her understanding and acceptance.   Dental advisory given  Plan Discussed with: CRNA  Anesthesia Plan Comments:         Anesthesia Quick Evaluation

## 2013-02-07 NOTE — H&P (Signed)
Patient interval history reviewed.  Patient examined again.  There has been no change from documented H/P dated 01/23/13 (scanned into chart from our office) except as documented above.  Assessment:  1.  Abdominal pain. 2.  Nausea and vomiting. 3.  Iron-deficiency anemia. 4.  Cirrhosis with portal hypertension. 5.  Chronic leukopenia (2.3) and thrombocytopenia (60K).  Plan:  1.  Endoscopy and colonoscopy. 2.  Risks (bleeding, infection, bowel perforation that could require surgery, sedation-related changes in cardiopulmonary systems), benefits (identification and possible treatment of source of symptoms, exclusion of certain causes of symptoms), and alternatives (watchful waiting, radiographic imaging studies, empiric medical treatment) of upper endoscopy (EGD) were explained to patient/family in detail and patient wishes to proceed. 3.  Risks (bleeding, infection, bowel perforation that could require surgery, sedation-related changes in cardiopulmonary systems), benefits (identification and possible treatment of source of symptoms, exclusion of certain causes of symptoms), and alternatives (watchful waiting, radiographic imaging studies, empiric medical treatment) of colonoscopy were explained to patient/family in detail and patient wishes to proceed.

## 2013-02-07 NOTE — Op Note (Signed)
[facility] [address] [place], [address]   COLONOSCOPY PROCEDURE REPORT  PATIENT: [name], Oscar J.  MR#: [id number] BIRTHDATE: August 18, 1965 , 47  yrs. old GENDER: Male ENDOSCOPIST: Willis Modena, MD REFERRED AO:ZHYQM Myna Hidalgo, M.D. PROCEDURE DATE:  02/07/2013 PROCEDURE:   Colonoscopy with snare polypectomy ASA CLASS:   Class III INDICATIONS:iron-deficiency anemia, abdominal pain. MEDICATIONS: MAC sedation, administered by CRNA DESCRIPTION OF PROCEDURE:   After the risks benefits and alternatives of the procedure were thoroughly explained, informed consent was obtained.  A digital rectal exam revealed no abnormalities of the rectum.   The Pentax Ped Colon [id number] endoscope was introduced through the anus and advanced to the cecum, which was identified by both the appendix and ileocecal valve. No adverse events experienced.   The quality of the prep was fair.  The instrument was then slowly withdrawn as the colon was fully examined.  Findings:  Normal digital rectal exam.  Prep quality in cecum, ascending colon and proximal transverse colon was fair; subtle or diminutive polyps could easily have been missed.  Prep elsewhere was acceptable.  8mm ascending colon polyp, removed with hot snare. 15mm pedunculated polyp in sigmoid colon removed piecemeal with hot snare.  At conclusion of sigmoid polypectomy, patient had witnessed arterial spurting from the base of the polypectomy site. 8cc of diluted 1:10,000 EPI was injected around the polypectomy bed and 5 hemoclips were applied.  Hemostasis appears to have been achieved.  There was another 10mm polyp in the distal descending colon that was not removed, in light of the sigmoid colon post-polypectomy bleeding.     Retroflexion into the rectum revealed some likely rectal varices but was otherwise normal  . The scope was withdrawn and the procedure completed.  ENDOSCOPIC IMPRESSION:     As above.  Colon  polyps removed, one in sigmoid colon with significant post-polypectomy bleeding, requiring endoscopic treatment as described above.  Unclear if large polyp was contributing to anemia, but I suspect the anemia was mostly from portal gastropathy.  RECOMMENDATIONS:     1.  Watch for potential complications of procedure. 2.  Admit to stepdown unit for observation, with hospitalist consult to help with other medical problems. 3.  Interventional Radiology consult has been called.  If patient has further rebleeding, would need to consider angiogram with embolization as next step in management.  eSigned:  Willis Modena, MD 02/07/2013 10:47 AM  cc:

## 2013-02-07 NOTE — Progress Notes (Signed)
Patient ID: Oscar [name], male   DOB: 10-24-1965, 47 y.o.   MRN: [id number]                                                                                                                                                                                                                                                                                                                                  Request received from GI service for possible mesenteric arteriogram/embolization on pt with history of abd pain,N/V, Fe def anemia, chronic leukopenia/thrombocytopenia and colonoscopy with sigmoid polypectomy today followed by arterial bleeding at polypectomy bed site. Pt received epi and clipping at site which seems to have resolved bleeding at this time. Additional PMH sig for gastroesophageal/splenic varices, cirrhosis, fatty liver, portal HTN, splenomegaly, HTN,DM. Exam: pt in endo suite with wife; awake/alert; c/o some "gas pains" in lower abd region; chest- CTA bilat; heart- RRR; abd- soft,+BS, mildly tender pelvic region; ext- FROM, trace edema, intact distal pulses,2+ rt femoral pulse.     Filed Vitals:   02/07/13 1045 02/07/13 1055 02/07/13 1100 02/07/13 1110  BP: 175/43 137/76 132/91 136/83  Pulse:      Temp:      TempSrc:      Resp: 10 9 7 21   SpO2: 99% 100% 99% 96%   Past Medical History  Diagnosis Date  . Diabetes mellitus   . Hypertension   . GERD (gastroesophageal reflux disease)   . Fatty liver   . Obesity   . Pancytopenia   History reviewed. No pertinent past surgical history.      *RADIOLOGY REPORT*  Clinical Data: Pancytopenia. Suspected hepatosplenomegaly on  physical exam with possible cirrhosis.  CT ABDOMEN AND PELVIS WITH CONTRAST  Technique: Multidetector CT imaging of the abdomen and pelvis was  performed following the standard protocol during bolus  administration of intravenous contrast.  Contrast: 129m OMNIPAQUE IOHEXOL 300 MG/ML SOLN  Comparison: None.   Findings:  Lung bases are essentially clear.  Moderate hepatic steatosis. Mildly nodular hepatic contour,  reflecting cirrhosis.  Splenomegaly, measuring 15.2 cm and maximal dimension.  Pancreas and adrenal glands are within normal limits.  Gallbladder is unremarkable. No intrahepatic or extrahepatic  ductal dilatation.  Kidneys are within normal limits. No hydronephrosis.  No evidence of bowel obstruction. Normal appendix.  No evidence of abdominal aortic aneurysm. Small gastroesophageal  and gastrosplenic varices. Recanalized paraumbilical vein. Varices  along the right anterior abdominal wall musculature (series 2/image  67).  No abdominopelvic ascites.  Small upper abdominal lymph nodes measuring up to 1.6 cm short axis  (series 2/image 30), likely reactive.  Prostate is unremarkable.  Bladder is within normal limits.  Degenerative changes of the visualized thoracolumbar spine.  IMPRESSION:  Cirrhosis. Superimposed moderate hepatic steatosis.  Splenomegaly with varices including a recanalized paraumbilical  vein.  Small upper abdominal lymph nodes measuring up to 1.6 cm short  axis, likely reactive.  Original Report Authenticated By: Julian Hy, M.D.    Results for orders placed during the hospital encounter of 02/07/13  GLUCOSE, CAPILLARY      Result Value Range   Glucose-Capillary 253 (*) 70 - 99 mg/dL  GLUCOSE, CAPILLARY      Result Value Range   Glucose-Capillary 262 (*) 70 - 99 mg/dL   A/P: Pt with hx abd pain,N/V, anemia, chronic leukopenia/thrombocytopenia, cirrhosis with portal HTN, gastroesoph/splenic varices, splenomegaly, HTN ,DM; s/p colonoscopy with sigmoid polypectomy today complicated by arterial bleed at polypectomy bed site- tx'd with epi injection/clipping with no further active bleeding at this time. Follow up labs pending. Most recent plt count was 62k on 02/05/13. Details/risks of mesenteric arteriogram with possible embolization were d/w pt/wife  with their understanding and consent. If pt continues to bleed he is aware that arteriogram may be performed. Will cont to monitor.

## 2013-02-07 NOTE — Op Note (Signed)
[facility] [address] [place], [address]   ENDOSCOPY PROCEDURE REPORT  PATIENT: [name], Oscar Malinowski.  MR#: [id number] BIRTHDATE: [birth year] , 47  yrs. old GENDER: Male ENDOSCOPIST: Willis Modena, MD REFERRED BY:  Dr. Arlan Organ PROCEDURE DATE:  02/07/2013 PROCEDURE:  EGD, diagnostic ASA CLASS:     Class III INDICATIONS:  iron-deficiency anemia, abdominal pain, nausea, vomiting. MEDICATIONS: MAC sedation, administered by CRNA TOPICAL ANESTHETIC: Cetacaine Spray  DESCRIPTION OF PROCEDURE: After the risks benefits and alternatives of the procedure were thoroughly explained, informed consent was obtained.  The    endoscope was introduced through the mouth and advanced to the second portion of the duodenum. Without limitations.  The instrument was slowly withdrawn as the mucosa was fully examined.     Findings:  Three columns of grade II/III distal esophageal varices without red wale signs or stigmata of hemorrhage.  Mild-to-moderate diffuse portal hypertensive gastropathy.  No gastric varices in the cardia.  However, there were some nodules versus varices in the antrum.    Otherwise normal endoscopy to the second portion of the duodenum.         The scope was then withdrawn from the patient and the procedure completed.  ENDOSCOPIC IMPRESSION:     As above.  Esophageal varices seen. Portal hypertensive gastropathy.  Possible antral varices. Gastropathy could lead to anemia, but colonic source can't be excluded.  RECOMMENDATIONS:     1.  Watch for potential complications of procedure. 2.  Consider low-dose propranolol for his varices. 3.  Proceed with colonoscopy.  eSigned:  Willis Modena, MD 02/07/2013 10:35 AM   CC:

## 2013-02-07 NOTE — Consult Note (Signed)
[facility] Medical Consultation  Oscar [name] [id number] DOB: 06/09/1965 DOA: 02/07/2013 PCP: Elby Showers, MD   Requesting physician: Dr. Paulita Fujita Date of consultation: 02/07/13 Reason for consultation: General medical concerns  Impression/Recommendations Principal Problem:   Acute GI bleeding-secondary to polypectomy. Hemoglobin is actually very stable at 13.3 Defer medical decision making to subspecialist, Dr. Paulita Fujita- patient is currently on sips and clears and should be covered with every 4 hourly blood sugar checks in case he does need further operative/surgical intervention Active Problems:   Hyperlipidemia-hold statin for now   Fatty liver-potentially a combination of NASH given his habitus and chronic EtOH use   Elevated liver enzymes-gastroenterology to comment-I suspect that this is because of his fatty liver above   Poorly controlled diabetes mellitus-last HbA1c 06/01/12=11.2.  Interval surveillance as an outpatient with retinopathy/peripheral neuropathy screening and microalbumin urea testing-right now would cut back on his nightly dose of Novolin and from 10 units 5 units and cover him with only supplemental insulin coverage every 4 hourly until he takes full meals. Would not add back his Janumet in the hospital setting   Other pancytopenia-once again unclear etiology as per above-from his last office note appears to be a consumptive process. Workup still in progress per Dr. Antonieta Pert last office note.  Would hold off on platelets/FFP at this time unless he has a massive lead Mild hyponatremia-continue saline at 75 cc an hour   Habitual alcohol use-have counseled to discontinue    I will followup again tomorrow. Please contact me if I can be of assistance in the meanwhile. Thank you for this consultation.  Chief Complaint: Bleeding  HPI:  This 47 y/o ?Marland Kitchen History of fatty liver, anemia [undifferentiated] hypertension, MGUS vs. alcohol induced pancytopenia  followed by Dr. Marin Olp, elevated liver enzymes, hypertension hyperlipidemia was admitted for elective endoscopy/colonoscopy early on the morning of 02/07/13 by Dr. Paulita Fujita. Findings at the time of the scope showed the seizure varices with portal hypertensive gastropathy on endoscopy and hence colonoscopy was performed subsequently Colonoscopy performed showed 8 mm ascending colon pol which was removed with hot snare and 50 mm regulated polyp in sigmoid colon removed piecemeal-patient suddenly had a witnessed arterial spurting O. the polypectomy site and given patient's comorbidity of pancytopenia and platelets of 62,000, it was thought prudent 2 admit the patient to step down for further observation and monitoring   Review of Systems:  Chest pain shortness of breath blurred vision double vision weakness or a one side body falls dizziness Had a relatively large stool subsequent to colonoscopy downstairs but has had none since. Abdominal pain none vomiting or nausea  Past Medical History  Diagnosis Date  . Diabetes mellitus   . Hypertension   . GERD (gastroesophageal reflux disease)   . Fatty liver   . Obesity   . Pancytopenia    History reviewed. No pertinent past surgical history. Social History:  reports that he has never smoked. He has never used smokeless tobacco. He reports that he drinks about 3.6 ounces of alcohol per week. He reports that he does not use illicit drugs.  No Known Allergies History reviewed. No pertinent family history.  Prior to Admission medications   Medication Sig Start Date End Date Taking? Authorizing Provider  atenolol (TENORMIN) 50 MG tablet Take 50 mg by mouth every morning.  12/18/12  Yes Elby Showers, MD  aspirin 325 MG tablet Take 325 mg by mouth daily.    Historical Provider, MD  esomeprazole (NEXIUM) 20 MG capsule Take  20 mg by mouth daily before breakfast.    Historical Provider, MD  hydrochlorothiazide (HYDRODIURIL) 25 MG tablet Take 1 tablet (25 mg  total) by mouth every morning. 12/18/12   Elby Showers, MD  lisinopril (PRINIVIL,ZESTRIL) 20 MG tablet Take 1 tablet (20 mg total) by mouth every morning. 12/18/12   Elby Showers, MD  NOVOLIN N 100 UNIT/ML injection Inject 10 Units into the skin at bedtime.  10/31/12   Historical Provider, MD  NOVOLOG FLEXPEN 100 UNIT/ML SOPN FlexPen Inject 5 Units into the skin 3 (three) times daily with meals.  10/12/12   Historical Provider, MD  polyethylene glycol-electrolytes (NULYTELY/GOLYTELY) 420 G solution  02/05/13 02/07/13  Historical Provider, MD  sitaGLIPtan-metformin (JANUMET) 50-1000 MG per tablet Take 1 tablet by mouth 2 (two) times daily with a meal. 05/30/12   Elby Showers, MD   Physical Exam: Blood pressure 136/83, pulse 83, temperature 97.7 F (36.5 C), temperature source Oral, resp. rate 21, SpO2 96.00%. Filed Vitals:   02/07/13 1110  BP: 136/83  Pulse:   Temp:   Resp: 21     General:  Morbid obesity, Body mass index is 37.21 kg/(m^2). in no apparent distress  Eyes: EOMI NCAT  ENT: Clinically clear soft supple  Neck: Soft supple no organomegaly or thyromegaly  Cardiovascular: S1-S2 no murmur rub or gallop  Respiratory: Clinically clear  Abdomen: Cannot appreciate organomegaly. Nontender  Skin: No lower extremity edema  Musculoskeletal: Range of motion intact  Psychiatric: Euthymic  Neurologic: Grossly intact moving all 4 limbs equally  Labs on Admission:  Basic Metabolic Panel:  Recent Labs Lab 02/05/13 1545  NA 129*  K 4.1  CL 94*  CO2 26  GLUCOSE 486*  BUN 13  CREATININE 0.80  CALCIUM 9.0   Liver Function Tests:  Recent Labs Lab 02/05/13 1545  AST 58*  ALT 53  ALKPHOS 138*  BILITOT 1.6*  PROT 7.4  ALBUMIN 3.7   No results found for this basename: LIPASE, AMYLASE,  in the last 168 hours No results found for this basename: AMMONIA,  in the last 168 hours CBC:  Recent Labs Lab 02/05/13 1544  WBC 2.3*  NEUTROABS 1.4*  HGB 13.5  HCT 39.0   MCV 95  PLT 62*   Cardiac Enzymes: No results found for this basename: CKTOTAL, CKMB, CKMBINDEX, TROPONINI,  in the last 168 hours BNP: No components found with this basename: POCBNP,  CBG:  Recent Labs Lab 02/07/13 0850 02/07/13 1107  GLUCAP 253* 262*    Radiological Exams on Admission: No results found.  EKG: Independently reviewed. None  Time spent: [place], [facility] Pager [phone]  If 7PM-7AM, please contact night-coverage [email or web address] Password [email or web address] 02/07/2013, 12:14 PM

## 2013-02-07 NOTE — Anesthesia Postprocedure Evaluation (Signed)
Anesthesia Post Note  Patient: Oscar [name]  Procedure(s) Performed: Procedure(s) (LRB): ESOPHAGOGASTRODUODENOSCOPY (EGD) WITH PROPOFOL (N/A) COLONOSCOPY WITH PROPOFOL (N/A)  Anesthesia type: MAC  Patient location: PACU  Post pain: Pain level controlled  Post assessment: Post-op Vital signs reviewed  Last Vitals:  Filed Vitals:   02/07/13 1110  BP: 136/83  Pulse:   Temp:   Resp: 21    Post vital signs: Reviewed  Level of consciousness: sedated  Complications: No apparent anesthesia complications

## 2013-02-07 NOTE — H&P (Signed)
[facility] Admission/Observation Note  Chief Complaint: post-polypectomy note  HPI: Oscar [name] is an 47 y.o. male who underwent endoscopy and colonoscopy today for iron-deficiency anemia, nausea, vomiting.  Was found to have couple large polyps, sigmoid polypectomy done with significant post-polypectomy bleeding.  Had clips and EPI administered, which seems to have stopped the bleeding.  Past Medical History  Diagnosis Date  . Diabetes mellitus   . Hypertension   . GERD (gastroesophageal reflux disease)   . Fatty liver   . Obesity   . Pancytopenia     History reviewed. No pertinent past surgical history.  Medications Prior to Admission  Medication Sig Dispense Refill  . atenolol (TENORMIN) 50 MG tablet Take 50 mg by mouth every morning.       Marland Kitchen aspirin 325 MG tablet Take 325 mg by mouth daily.      Marland Kitchen esomeprazole (NEXIUM) 20 MG capsule Take 20 mg by mouth daily before breakfast.      . hydrochlorothiazide (HYDRODIURIL) 25 MG tablet Take 1 tablet (25 mg total) by mouth every morning.  90 tablet  1  . lisinopril (PRINIVIL,ZESTRIL) 20 MG tablet Take 1 tablet (20 mg total) by mouth every morning.  90 tablet  1  . NOVOLIN N 100 UNIT/ML injection Inject 10 Units into the skin at bedtime.       Marland Kitchen NOVOLOG FLEXPEN 100 UNIT/ML SOPN FlexPen Inject 5 Units into the skin 3 (three) times daily with meals.       . polyethylene glycol-electrolytes (NULYTELY/GOLYTELY) 420 G solution       . sitaGLIPtan-metformin (JANUMET) 50-1000 MG per tablet Take 1 tablet by mouth 2 (two) times daily with a meal.  180 tablet  0    Allergies: No Known Allergies  History reviewed. No pertinent family history.  Social History:  reports that he has never smoked. He has never used smokeless tobacco. He reports that he drinks about 3.6 ounces of alcohol per week. He reports that he does not use illicit drugs.   ROS: As per HPI, all others negative  Blood pressure 136/83, pulse 83,  temperature 97.7 F (36.5 C), temperature source Oral, resp. rate 21, SpO2 96.00%. General appearance: chronically ill-appearing, NAD Head: Normocephalic, atraumatic Eyes: slight scleral icterus bilaterally Neck: No JVD Resp: Clear Cardio: Regular rhythm, normal rate GI: Soft, mild generalized tenderness (pre- and post-procedure) Extremities: Trace edema Skin: Scattered ecchymoses/telangiectasias  Neurologic: Non-focal without lateralizing signs  Results for orders placed during the hospital encounter of 02/07/13 (from the past 48 hour(s))  GLUCOSE, CAPILLARY     Status: Abnormal   Collection Time    02/07/13  8:50 AM      Result Value Range   Glucose-Capillary 253 (*) 70 - 99 mg/dL  GLUCOSE, CAPILLARY     Status: Abnormal   Collection Time    02/07/13 11:07 AM      Result Value Range   Glucose-Capillary 262 (*) 70 - 99 mg/dL   No results found.  Assessment/Plan  1. Post-polypectomy bleeding, likely exacerbated by history of thrombocytopenia (Plts 60K, INR 1.2 couple weeks ago).  Hemostasis seemingly achieve. 2.  Will admit to observation to stepdown unit, will have hospitalist consult to help with diabetes and other medical issues. 3.  If rebleeds, would consider angiogram with embolization.  Interventional radiology consult has been called. 4.  Ice chips only. 5.  CBC today and in am. 6.  If no further rebleeding overnight, can hopefully hold off on angiogram and could possibly  discharge home tomorrow.  Freddy Jaksch 02/07/2013, 11:14 AM

## 2013-02-07 NOTE — Preoperative (Signed)
Beta Blockers   Reason not to administer Beta Blockers:Not Applicable 

## 2013-02-08 ENCOUNTER — Encounter (HOSPITAL_COMMUNITY): Payer: Self-pay | Admitting: Gastroenterology

## 2013-02-08 LAB — BASIC METABOLIC PANEL
BUN: 5 mg/dL — ABNORMAL LOW (ref 6–23)
Calcium: 9.2 mg/dL (ref 8.4–10.5)
Chloride: 99 mEq/L (ref 96–112)
Creatinine, Ser: 0.68 mg/dL (ref 0.50–1.35)
GFR calc Af Amer: >90 mL/min (ref >90)
GFR calc non Af Amer: >90 mL/min (ref >90)
Potassium: 3.5 mEq/L (ref 3.5–5.1)

## 2013-02-08 LAB — CBC WITH DIFFERENTIAL/PLATELET
Basophils Relative: 0 % (ref 0–1)
Eosinophils Absolute: 0.1 10*3/uL (ref 0.0–0.7)
Eosinophils Relative: 3 % (ref 0–5)
HCT: 41 % (ref 39.0–52.0)
Lymphocytes Relative: 28 % (ref 12–46)
Lymphs Abs: 0.7 10*3/uL (ref 0.7–4.0)
MCH: 32.7 pg (ref 26.0–34.0)
MCV: 94.5 fL (ref 78.0–100.0)
Monocytes Absolute: 0.2 10*3/uL (ref 0.1–1.0)
RBC: 4.34 MIL/uL (ref 4.22–5.81)
RDW: 17.4 % — ABNORMAL HIGH (ref 11.5–15.5)
WBC: 2.4 10*3/uL — ABNORMAL LOW (ref 4.0–10.5)

## 2013-02-08 LAB — GLUCOSE, CAPILLARY: Glucose-Capillary: 195 mg/dL — ABNORMAL HIGH (ref 70–99)

## 2013-02-08 MED ORDER — INFLUENZA VAC SPLIT QUAD 0.5 ML IM SUSP
0.5000 mL | Freq: Once | INTRAMUSCULAR | Status: AC
  Administered 2013-02-08: 0.5 mL via INTRAMUSCULAR
  Filled 2013-02-08: qty 0.5

## 2013-02-08 NOTE — Discharge Summary (Signed)
Physician Discharge Summary  Patient ID: Oscar [name] MRN: [id number] DOB/AGE: 05/31/1965 47 y.o.  Admit date: 02/07/2013 Discharge date: 02/08/2013  Admission Diagnoses: Post polypectomy bleeding   Discharge Diagnoses: Same  Principal Problem:   Acute GI bleeding Active Problems:   Hyperlipidemia   Fatty liver   Elevated liver enzymes   Poorly controlled diabetes mellitus   Other pancytopenia   Habitual alcohol use   Discharged Condition: Good  Hospital Course: Patient admitted for post-polypectomy bleeding during colonoscopy performed for evaluation of iron-deficiency anemia.  Endoscopic therapy was applied, hemostasis achieved.  Patient watched for overnight observation.  No further bleeding, no change in Hgb, and no hemodynamic instability.  Diet advanced and patient deemed appropriate for discharge.  Consults: Interventional Radiology  Significant Diagnostic Studies: None  Treatments: None  Discharge Exam: Blood pressure 124/65, pulse 65, temperature 98.6 F (37 C), temperature source Oral, resp. rate 23, height 5\' 7"  (1.702 m), weight 107.8 kg (237 lb 10.5 oz), SpO2 94.00%. GEN:  NAD LUNGS:  CTA CV:  RRR ABD:  Soft, non-tender, non-distended, active bowel sounds.  Disposition: Home with wife.  Discharge Instructions: 1.  Avoid ASA and NSAIDs for one week. 2.  [name] diet for next 48 hours, then gradually advance as tolerated. 3.  Call [name] [phone] to arrange outpatient follow-up with Dr. [name] in the next 3-4 weeks.  Patient will need repeat colonoscopy in approximately 6 months to reassess polypectomy site and to remove other seen polyp (which was not removed given patient's post-polypectomy bleeding).  Will need platelets administered immediately prior to any upcoming colonoscopy or endoscopic procedures if endoscopic therapy is anticipated.   Future Appointments Provider Department Dept Phone   05/16/2013 3:00 PM [name] [facility]  [facility] [facility] [phone]   05/16/2013 3:30 PM [name], MD [facility] [place] [phone]       Medication List    ASK your doctor about these medications       aspirin 325 MG tablet  Take 325 mg by mouth daily.     atenolol 50 MG tablet  Commonly known as:  TENORMIN  Take 50 mg by mouth every morning.     esomeprazole 20 MG capsule  Commonly known as:  NEXIUM  Take 20 mg by mouth daily before breakfast.     hydrochlorothiazide 25 MG tablet  Commonly known as:  HYDRODIURIL  Take 1 tablet (25 mg total) by mouth every morning.     lisinopril 20 MG tablet  Commonly known as:  PRINIVIL,ZESTRIL  Take 1 tablet (20 mg total) by mouth every morning.     NOVOLIN N 100 UNIT/ML injection  Generic drug:  insulin NPH  Inject 10 Units into the skin at bedtime.     NOVOLOG FLEXPEN 100 UNIT/ML Sopn FlexPen  Generic drug:  insulin aspart  Inject 5 Units into the skin 3 (three) times daily with meals.     polyethylene glycol-electrolytes 420 G solution  Commonly known as:  NuLYTELY/GoLYTELY     sitaGLIPtin-metformin 50-1000 MG per tablet  Commonly known as:  JANUMET  Take 1 tablet by mouth 2 (two) times daily with a meal.         Signed: Freddy Jaksch 02/08/2013, 7:50 AM

## 2013-02-22 ENCOUNTER — Emergency Department (HOSPITAL_COMMUNITY)
Admission: EM | Admit: 2013-02-22 | Discharge: 2013-02-22 | Disposition: A | Discharge status: Home / Self Care | Payer: BC Managed Care – PPO | Source: Home / Self Care

## 2013-02-22 ENCOUNTER — Encounter (HOSPITAL_COMMUNITY): Payer: Self-pay | Admitting: Emergency Medicine

## 2013-02-22 DIAGNOSIS — L049 Acute lymphadenitis, unspecified: Secondary | ICD-10-CM

## 2013-02-22 DIAGNOSIS — L040 Acute lymphadenitis of face, head and neck: Secondary | ICD-10-CM

## 2013-02-22 MED ORDER — AMOXICILLIN-POT CLAVULANATE 875-125 MG PO TABS: 1.0000 | ORAL_TABLET | Freq: Two times a day (BID) | ORAL | Status: DC

## 2013-02-22 NOTE — ED Notes (Signed)
Pt    Reports    Swelling      To  r  Side  Neck          X  3  Days       denys  Any  sorethroat            -       Airway  Intact    -            Appears  In no    Acute     Distress         Pt      Sitting  Upright  On  Exam table  Speaking in  Complete  sentances

## 2013-02-22 NOTE — ED Provider Notes (Signed)
CSN: [id number]     Arrival date & time 02/22/13  1358 History   First MD Initiated Contact with Patient 02/22/13 1531     Chief Complaint  Patient presents with  . Lymphadenopathy   (Consider location/radiation/quality/duration/timing/severity/associated sxs/prior Treatment) HPI Comments: 47 year old obese type II diabetic male presents with swelling to the right mandible. He started approximately 3 days ago as a small knot he lesion that is increased in size approximately 2 and half to 3 cm. It is located primarily to the submental area right of the midline. There is no erythema. There has been no drainage.   Past Medical History  Diagnosis Date  . Diabetes mellitus   . Hypertension   . GERD (gastroesophageal reflux disease)   . Fatty liver   . Obesity   . Pancytopenia    Past Surgical History  Procedure Laterality Date  . Esophagogastroduodenoscopy (egd) with propofol N/A 02/07/2013    Procedure: ESOPHAGOGASTRODUODENOSCOPY (EGD) WITH PROPOFOL;  Surgeon: Willis Modena, MD;  Location: [facility];  Service: Endoscopy;  Laterality: N/A;  . Colonoscopy with propofol N/A 02/07/2013    Procedure: COLONOSCOPY WITH PROPOFOL;  Surgeon: Willis Modena, MD;  Location: [facility];  Service: Endoscopy;  Laterality: N/A;   History reviewed. No pertinent family history. History  Substance Use Topics  . Smoking status: Never Smoker   . Smokeless tobacco: Never Used  . Alcohol Use: 3.6 oz/week    6 Cans of beer per week     Comment: socially-heavy beer use 5-6 beers daily, more on weekends    Review of Systems  All other systems reviewed and are negative.    Allergies  Review of patient's allergies indicates no known allergies.  Home Medications   Current Outpatient Rx  Name  Route  Sig  Dispense  Refill  . amoxicillin-clavulanate (AUGMENTIN) 875-125 MG per tablet   Oral   Take 1 tablet by mouth every 12 (twelve) hours.   20 tablet   0   . aspirin 325 MG tablet   Oral    Take 325 mg by mouth daily.         Marland Kitchen atenolol (TENORMIN) 50 MG tablet   Oral   Take 50 mg by mouth every morning.          Marland Kitchen esomeprazole (NEXIUM) 20 MG capsule   Oral   Take 20 mg by mouth daily before breakfast.         . hydrochlorothiazide (HYDRODIURIL) 25 MG tablet   Oral   Take 1 tablet (25 mg total) by mouth every morning.   90 tablet   1   . lisinopril (PRINIVIL,ZESTRIL) 20 MG tablet   Oral   Take 1 tablet (20 mg total) by mouth every morning.   90 tablet   1   . NOVOLIN N 100 UNIT/ML injection   Subcutaneous   Inject 10 Units into the skin at bedtime.          Marland Kitchen NOVOLOG FLEXPEN 100 UNIT/ML SOPN FlexPen   Subcutaneous   Inject 5 Units into the skin 3 (three) times daily with meals.          . sitaGLIPtan-metformin (JANUMET) 50-1000 MG per tablet   Oral   Take 1 tablet by mouth 2 (two) times daily with a meal.   180 tablet   0    BP 120/72  Pulse 78  Temp(Src) 99.2 F (37.3 C) (Oral)  Resp 14  SpO2 99% Physical Exam  Nursing note and vitals reviewed.  Constitutional: He is oriented to person, place, and time. He appears well-developed and well-nourished. No distress.  HENT:  Mouth/Throat: Oropharynx is clear and moist. No oropharyngeal exudate.  No tenderness to the oral structures. No dental tenderness and no intraoral swelling or erythema.  Neck: Normal range of motion.  The swelling to the right submental mandible is thick and firm. Does not palpate as fluctuant or cystic. It is tender. No erythema. No other palpable cervical lymph nodes.  Cardiovascular: Normal rate.   Pulmonary/Chest: Effort normal.  Musculoskeletal: He exhibits no edema.  Lymphadenopathy:    He has cervical adenopathy.  Neurological: He is alert and oriented to person, place, and time. He exhibits normal muscle tone.  Skin: Skin is warm and dry. No rash noted.  Psychiatric: He has a normal mood and affect.    ED Course  Procedures (including critical care  time) Labs Review Labs Reviewed - No data to display Imaging Review No results found.    MDM   1. Acute lymphadenitis of face, head and neck      Warm compresses at least 4 times a day Augmentin 875 twice a day for 10 days. For worsening such as increased in size, pain or development of fever will need to see your PCP or go to the emergency department.  Hayden Rasmussen, NP 02/22/13 [phone]

## 2013-02-24 HISTORY — PX: INCISION AND DRAINAGE MOUTH: SUR676

## 2013-02-26 ENCOUNTER — Encounter: Payer: Self-pay | Admitting: *Deleted

## 2013-02-26 NOTE — Progress Notes (Signed)
Pt's wife called to report that the "gland on pt's jaw has gotten bigger and more painful".  Sates he went to urgent care last Thursday, 02/22/13 and they put him on Augmentin.  Over the weekend his temp went as high at 102.  Asking what they should do.  Per Dr. Myna Hidalgo, call/ see primary care physician.

## 2013-02-27 ENCOUNTER — Ambulatory Visit
Admission: RE | Admit: 2013-02-27 | Discharge: 2013-02-27 | Disposition: A | Discharge status: Home / Self Care | Payer: BC Managed Care – PPO | Source: Ambulatory Visit | Attending: Internal Medicine | Admitting: Internal Medicine

## 2013-02-27 ENCOUNTER — Encounter: Payer: Self-pay | Admitting: Internal Medicine

## 2013-02-27 ENCOUNTER — Telehealth: Payer: Self-pay | Admitting: Internal Medicine

## 2013-02-27 ENCOUNTER — Ambulatory Visit (INDEPENDENT_AMBULATORY_CARE_PROVIDER_SITE_OTHER): Payer: BC Managed Care – PPO | Admitting: Internal Medicine

## 2013-02-27 VITALS — BP 102/62 | HR 74 | Temp 100.0°F | Ht 70.0 in | Wt 236.0 lb

## 2013-02-27 DIAGNOSIS — L049 Acute lymphadenitis, unspecified: Secondary | ICD-10-CM

## 2013-02-27 DIAGNOSIS — R221 Localized swelling, mass and lump, neck: Secondary | ICD-10-CM

## 2013-02-27 DIAGNOSIS — R220 Localized swelling, mass and lump, head: Secondary | ICD-10-CM

## 2013-02-27 LAB — CBC WITH DIFFERENTIAL/PLATELET
Hemoglobin: 13.6 g/dL (ref 13.0–17.0)
Lymphocytes Relative: 9 % — ABNORMAL LOW (ref 12–46)
Lymphs Abs: 0.6 10*3/uL — ABNORMAL LOW (ref 0.7–4.0)
MCHC: 34.1 g/dL (ref 30.0–36.0)
Monocytes Relative: 5 % (ref 3–12)
Neutro Abs: 6.2 10*3/uL (ref 1.7–7.7)
Neutrophils Relative %: 86 % — ABNORMAL HIGH (ref 43–77)
RBC: 4.1 MIL/uL — ABNORMAL LOW (ref 4.22–5.81)
WBC: 7.2 10*3/uL (ref 4.0–10.5)

## 2013-02-27 LAB — BUN: BUN: 18 mg/dL (ref 6–23)

## 2013-02-27 LAB — CREATININE, SERUM: Creat: 1.5 mg/dL — ABNORMAL HIGH (ref 0.50–1.35)

## 2013-02-27 MED ORDER — CEFTRIAXONE SODIUM 1 G IJ SOLR
1.0000 g | Freq: Once | INTRAMUSCULAR | Status: AC
  Administered 2013-02-27: 1 g via INTRAMUSCULAR

## 2013-02-27 MED ORDER — CLINDAMYCIN HCL 300 MG PO CAPS: 300.0000 mg | ORAL_CAPSULE | Freq: Three times a day (TID) | ORAL | Status: DC

## 2013-02-27 MED ORDER — IOHEXOL 300 MG/ML  SOLN
75.0000 mL | Freq: Once | INTRAMUSCULAR | Status: AC | PRN
  Administered 2013-02-27: 75 mL via INTRAVENOUS

## 2013-02-27 NOTE — Telephone Encounter (Signed)
LM for Oscar [name] @ [facility] @ [phone] w/ref # for patient.

## 2013-02-27 NOTE — Patient Instructions (Signed)
Take clindamycin 300 mg 4 times daily. You have been given an injection of Rocephin today. You are  to see Dr.  Erik Obey ENT physician tomorrow.

## 2013-02-27 NOTE — ED Provider Notes (Signed)
Medical screening examination/treatment/procedure(s) were performed by resident physician or non-physician practitioner and as supervising physician I was immediately available for consultation/collaboration.   Sahej Hauswirth DOUGLAS MD.   Jerry Clyne D Tykeshia Tourangeau, MD 02/27/13 1501 

## 2013-02-27 NOTE — Progress Notes (Signed)
  Subjective:    Patient ID: Oscar [name], male    DOB: 09-Jun-1965, 47 y.o.   MRN: [id number]  HPI Patient was seen by Dr. Artis Flock  at  [facility] on October 30th for swelling around right mandible and below right mandible. He denies any dental pain. He has had fever despite being on Augmentin since October 30. [place] went to [place] in [place] on [place], [place]. Noticed a small nodule just below right mandible which subsequently turned into considerable amount of swelling and tenderness. No trouble swallowing. He has a history of pancytopenia thought related to bone marrow suppression from alcohol abuse and cirrhosis. He is followed by Dr. Myna Hidalgo. He's had a recent bone marrow biopsy that did not show any evidence of malignancy. He is also diabetic. He has not been drinking any alcohol now for several weeks. Hasn't felt like eating much lately. Has been drinking fluids. Ate some chili earlier today from [facility]. Wife is very responsible and says that he had fever up to 103 this past weekend despite being on Augmentin. White blood cell count today is 7200 which is higher than his usual 2000 range. We sent him for stat CT of his neck mass.    Review of Systems     Objective:   Physical Exam fluctuant swelling with erythema right mandibular area and below right mandible. Pharynx is clear. TMs are clear. He is alert and oriented. He has a beard. Neck is supple. He is alert and oriented.        Assessment & Plan:  Fluctuant mass/swelling right. Perimandibular area Plan: CBC with differential drawn with results above. Given 1 g IM Rocephin. Started on clindamycin 300 mg 4 times daily for 10 days. Appointment to see ENT physician tomorrow. CT of the neck pending.

## 2013-03-13 ENCOUNTER — Encounter: Payer: BC Managed Care – PPO | Attending: Endocrinology

## 2013-03-13 VITALS — Ht 67.0 in | Wt 239.7 lb

## 2013-03-13 DIAGNOSIS — E119 Type 2 diabetes mellitus without complications: Secondary | ICD-10-CM | POA: Insufficient documentation

## 2013-03-13 DIAGNOSIS — E1165 Type 2 diabetes mellitus with hyperglycemia: Secondary | ICD-10-CM

## 2013-03-13 DIAGNOSIS — Z713 Dietary counseling and surveillance: Secondary | ICD-10-CM | POA: Insufficient documentation

## 2013-03-15 NOTE — Progress Notes (Signed)
Patient was seen on 03/13/13 for the first of a series of three diabetes self-management courses at [facility].  Current HbA1c: 10.0%   The following learning objectives were met by the patient during this class:  Describe diabetes  State some common risk factors for diabetes  Defines the role of glucose and insulin  Identifies type of diabetes and pathophysiology  Describe the relationship between diabetes and cardiovascular risk  State the members of the Healthcare Team  States the rationale for glucose monitoring  State when to test glucose  State their individual Target Range  State the importance of logging glucose readings  Describe how to interpret glucose readings  Identifies A1C target  Explain the correlation between A1c and eAG values  State symptoms and treatment of high blood glucose  State symptoms and treatment of low blood glucose  Explain proper technique for glucose testing  Identifies proper sharps disposal  Handouts given during class include:  Living Well with Diabetes book  Carb Counting and Meal Planning book  Meal Plan Card  Carbohydrate guide  Meal planning worksheet  Low Sodium Flavoring Tips  The diabetes portion plate  Low Carbohydrate Snack Suggestions  A1c to eAG Conversion Chart  Diabetes Medications  Stress Management  Diabetes Recommended Care Schedule  Diabetes Success Plan  Core Class Satisfaction Survey  Your patient has identified their diabetes care support plan as:  [facility]  Staff  Primary Care Physician  Follow-Up Plan:  Attend core 2

## 2013-03-20 ENCOUNTER — Encounter (HOSPITAL_COMMUNITY): Payer: Self-pay

## 2013-03-20 DIAGNOSIS — E1165 Type 2 diabetes mellitus with hyperglycemia: Secondary | ICD-10-CM

## 2013-03-21 NOTE — Progress Notes (Signed)
Patient was seen on 03/20/13 for the second of a series of three diabetes self-management courses at the [facility]. The following learning objectives were met by the patient during this class:   Describe the role of different macronutrients on glucose  Explain how carbohydrates affect blood glucose  State what foods contain the most carbohydrates  Demonstrate carbohydrate counting  Demonstrate how to read Nutrition Facts food label  Describe effects of various fats on heart health  Describe the importance of good nutrition for health and healthy eating strategies  Describe techniques for managing your shopping, cooking and meal planning  List strategies to follow meal plan when dining out  Describe the effects of alcohol on glucose and how to use it safely    Follow-Up Plan:  Attend Core 3  Work towards following your personal food plan.

## 2013-03-27 ENCOUNTER — Encounter: Payer: BC Managed Care – PPO | Attending: Endocrinology

## 2013-03-27 DIAGNOSIS — E1165 Type 2 diabetes mellitus with hyperglycemia: Secondary | ICD-10-CM

## 2013-03-27 DIAGNOSIS — Z713 Dietary counseling and surveillance: Secondary | ICD-10-CM | POA: Insufficient documentation

## 2013-03-27 DIAGNOSIS — E119 Type 2 diabetes mellitus without complications: Secondary | ICD-10-CM | POA: Insufficient documentation

## 2013-03-28 NOTE — Progress Notes (Signed)
Patient was seen on 03/27/13 for the third of a series of three diabetes self-management courses at the [facility]. The following learning objectives were met by the patient during this class:    State the amount of activity recommended for healthy living   Describe activities suitable for individual needs   Identify ways to regularly incorporate activity into daily life   Identify barriers to activity and ways to over come these barriers  Identify diabetes medications being personally used and their primary action for lowering glucose and possible side effects   Describe role of stress on blood glucose and develop strategies to address psychosocial issues   Identify diabetes complications and ways to prevent them  Explain how to manage diabetes during illness   Evaluate success in meeting personal goal   Establish 2-3 goals that they will plan to diligently work on until they return for the  56-monthfollow-up visit  Goals:  Follow Diabetes Meal Plan as instructed  Aim for 15-30 mins of physical activity daily as tolerated  Bring food record and glucose log to your follow up visit  Your patient has established the following 4 month goals in their individualized success plan: I will count my carb choices at most meals and snacks I will increase my activity level at least 6 days a week I will take my diabetes medications as scheduled I will test my glucose at least 3 times a day, 7 days a week  Your patient has identified these potential barriers to change:  None offered  Your patient has identified their diabetes self-care support plan as  Work on very hard

## 2013-04-27 ENCOUNTER — Ambulatory Visit (INDEPENDENT_AMBULATORY_CARE_PROVIDER_SITE_OTHER): Payer: BC Managed Care – PPO | Admitting: Internal Medicine

## 2013-04-27 ENCOUNTER — Encounter: Payer: Self-pay | Admitting: Internal Medicine

## 2013-04-27 VITALS — BP 108/60 | HR 80 | Temp 98.8°F | Wt 240.0 lb

## 2013-04-27 DIAGNOSIS — R161 Splenomegaly, not elsewhere classified: Secondary | ICD-10-CM

## 2013-04-27 DIAGNOSIS — E119 Type 2 diabetes mellitus without complications: Secondary | ICD-10-CM

## 2013-04-27 DIAGNOSIS — K7689 Other specified diseases of liver: Secondary | ICD-10-CM

## 2013-04-27 DIAGNOSIS — D61818 Other pancytopenia: Secondary | ICD-10-CM

## 2013-04-27 DIAGNOSIS — M5412 Radiculopathy, cervical region: Secondary | ICD-10-CM

## 2013-04-27 DIAGNOSIS — K760 Fatty (change of) liver, not elsewhere classified: Secondary | ICD-10-CM

## 2013-04-27 DIAGNOSIS — M5410 Radiculopathy, site unspecified: Secondary | ICD-10-CM

## 2013-04-27 MED ORDER — HYDROCODONE-ACETAMINOPHEN 10-325 MG PO TABS: 1.0000 | ORAL_TABLET | Freq: Two times a day (BID) | ORAL | Status: DC

## 2013-04-27 MED ORDER — HYDROCHLOROTHIAZIDE 25 MG PO TABS: 25.0000 mg | ORAL_TABLET | Freq: Every morning | ORAL | Status: DC

## 2013-04-27 MED ORDER — METHYLPREDNISOLONE (PAK) 4 MG PO TABS: ORAL_TABLET | ORAL | Status: DC

## 2013-04-27 MED ORDER — HYDROCHLOROTHIAZIDE 25 MG PO TABS: 25.0000 mg | ORAL_TABLET | Freq: Every day | ORAL | Status: DC

## 2013-04-27 MED ORDER — ATENOLOL 50 MG PO TABS: 50.0000 mg | ORAL_TABLET | Freq: Every day | ORAL | Status: DC

## 2013-04-27 NOTE — Patient Instructions (Addendum)
Take Medrol Dosepak and Norco as directed. MRI of C-spine will be arranged. Scheduled for MRI C spine on 05/03/2013 at 6:30 am. Patient informed.

## 2013-04-27 NOTE — Progress Notes (Signed)
   Subjective:    Patient ID: Oscar [name], male    DOB: Sep 30, 1965, 48 y.o.   MRN: [id number]  HPI At last visit,  he was found to have a submandibular abscess by ENT physician which had to be drained. He's recovered well from that. Says glucoses running about 160. He has a history of chronic liver disease. Has developed pain in left neck and shoulder with radiculopathy left upper extremity. 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 Has noticed tingling and second third and fourth fingers at times.    Review of Systems     Objective:   Physical Exam Deep tendon reflexes in the left upper extremity 1+ and symmetrical. Muscle strength is 5 over 5 in the left upper tremor the. He has some tenderness in his left paracervical area but no palpable muscle spasm in the trapezius or sternocleidomastoid muscles.       Assessment & Plan:  Left upper extremity radiculopathy and left neck pain-- suspect cervical disc  History of chronic liver disease with splenomegaly  Diabetes mellitus  Hypertension  Plan: Medrol 4 mg 6 day dosepak. Norco 10/325 one by mouth twice a day. Arrange for MRI of the C-spine.  25 minutes spent with patient

## 2013-05-03 ENCOUNTER — Ambulatory Visit
Admission: RE | Admit: 2013-05-03 | Discharge: 2013-05-03 | Disposition: A | Discharge status: Home / Self Care | Payer: BC Managed Care – PPO | Source: Ambulatory Visit | Attending: Internal Medicine | Admitting: Internal Medicine

## 2013-05-04 NOTE — Progress Notes (Signed)
Patient informed. Records faxed to [facility]

## 2013-05-10 ENCOUNTER — Encounter: Payer: BC Managed Care – PPO | Admitting: Internal Medicine

## 2013-05-10 MED ORDER — MEPERIDINE HCL 50 MG PO TABS: 50.0000 mg | ORAL_TABLET | ORAL | Status: DC | PRN

## 2013-05-15 ENCOUNTER — Other Ambulatory Visit: Payer: Self-pay | Admitting: Neurosurgery

## 2013-05-16 ENCOUNTER — Ambulatory Visit (HOSPITAL_BASED_OUTPATIENT_CLINIC_OR_DEPARTMENT_OTHER): Payer: BC Managed Care – PPO | Admitting: Hematology & Oncology

## 2013-05-16 ENCOUNTER — Other Ambulatory Visit (HOSPITAL_BASED_OUTPATIENT_CLINIC_OR_DEPARTMENT_OTHER): Payer: BC Managed Care – PPO | Admitting: Lab

## 2013-05-16 ENCOUNTER — Encounter: Payer: Self-pay | Admitting: Hematology & Oncology

## 2013-05-16 VITALS — BP 110/69 | HR 77 | Temp 98.2°F | Resp 18 | Ht 68.0 in | Wt 237.0 lb

## 2013-05-16 DIAGNOSIS — D693 Immune thrombocytopenic purpura: Secondary | ICD-10-CM

## 2013-05-16 DIAGNOSIS — D649 Anemia, unspecified: Secondary | ICD-10-CM

## 2013-05-16 DIAGNOSIS — D61818 Other pancytopenia: Secondary | ICD-10-CM

## 2013-05-16 DIAGNOSIS — D509 Iron deficiency anemia, unspecified: Secondary | ICD-10-CM

## 2013-05-16 LAB — CBC WITH DIFFERENTIAL (CANCER CENTER ONLY)
BASO#: 0 10*3/uL (ref 0.0–0.2)
BASO%: 0 % (ref 0.0–2.0)
EOS ABS: 0.1 10*3/uL (ref 0.0–0.5)
EOS%: 3.1 % (ref 0.0–7.0)
HCT: 38.1 % — ABNORMAL LOW (ref 38.7–49.9)
HGB: 13.1 g/dL (ref 13.0–17.1)
LYMPH#: 0.7 10*3/uL — ABNORMAL LOW (ref 0.9–3.3)
LYMPH%: 22.8 % (ref 14.0–48.0)
MCH: 31.3 pg (ref 28.0–33.4)
MCHC: 34.4 g/dL (ref 32.0–35.9)
MCV: 91 fL (ref 82–98)
MONO#: 0.3 10*3/uL (ref 0.1–0.9)
MONO%: 9.5 % (ref 0.0–13.0)
NEUT%: 64.6 % (ref 40.0–80.0)
NEUTROS ABS: 1.9 10*3/uL (ref 1.5–6.5)
PLATELETS: 75 10*3/uL — AB (ref 145–400)
RBC: 4.19 10*6/uL — ABNORMAL LOW (ref 4.20–5.70)
RDW: 12.7 % (ref 11.1–15.7)
WBC: 2.9 10*3/uL — ABNORMAL LOW (ref 4.0–10.0)

## 2013-05-16 LAB — CHCC SATELLITE - SMEAR

## 2013-05-16 NOTE — Progress Notes (Signed)
This office note has been dictated.

## 2013-05-17 ENCOUNTER — Telehealth: Payer: Self-pay | Admitting: Hematology & Oncology

## 2013-05-17 LAB — IRON AND TIBC CHCC
%SAT: 22 % (ref 20–55)
IRON: 107 ug/dL (ref 42–163)
TIBC: 491 ug/dL — AB (ref 202–409)
UIBC: 384 ug/dL — AB (ref 117–376)

## 2013-05-17 LAB — FERRITIN CHCC: Ferritin: 17 ng/ml — ABNORMAL LOW (ref 22–316)

## 2013-05-17 NOTE — Telephone Encounter (Signed)
Pt aware of 05-2013 appointments

## 2013-05-17 NOTE — Telephone Encounter (Signed)
Left message on home phone to call and schedule appointments. Left message on cell phone with 1-26 appointment date and time.

## 2013-05-17 NOTE — Progress Notes (Signed)
CC:   Oscar [name], M.D. Oscar [name]. Oscar [name], M.D. Oscar [name], M.D. Oscar [name], M.D.  DIAGNOSES: 1. Immune thrombocytopenia. 2. Leukopenia. 3. Cirrhosis, likely nonalcoholic steatohepatitis/alcohol use. 4. Intermittent iron-deficiency anemia.  CURRENT THERAPY:  Observation.  INTERIM HISTORY:  Oscar [name] comes in for a followup.  Unfortunately, we are in a tough situation with him.  He needs to have neck surgery in 2 weeks.  He somehow herniated the disc at C5-6, then he has a moderate- to-large left fragment.  This causes some effect upon the cord and left C6 nerve root.  He is having a lot of pain in the left shoulder and arm.  There maybe a little bit of weakness in the left arm.  He is going to undergo a surgery on February 4th.  We need to get his platelet count up.  He has had a recent bleeding.  He had some GI bleeding following a endoscopic procedure.  As such, I do not think we can get him on steroids as this would potentially exacerbate GI bleeding.  He is not drinking.  He has had no cough or shortness of breath.  He has had no abdominal pain.  PHYSICAL EXAMINATION:  General:  This is a slightly obese white gentleman, in no obvious distress.  Vital Signs:  Temperature of 98.2, pulse 77, respiratory rate 18, blood pressure 110/69.  Weight is 237 pounds.  Head and Neck:  Normocephalic, atraumatic skull.  There are no ocular or oral lesions.  There are no palpable cervical or supraclavicular lymph nodes.  Lungs:  Clear bilaterally.  Cardiac: Regular rate and rhythm with a normal S1 and S2.  There are no murmurs, rubs, or bruits.  Abdomen:  Slightly obese.  Abdomen is soft.  No fluid wave is noted.  There is no palpable abdominal mass.  There is no palpable hepatosplenomegaly.  Back:  No tenderness over the spine, ribs, or hips.  There are some spasms in the cervical muscles on the left. Extremities:  Show some slight weakness in the left arm.  He has  decent range motion of the joints.  Lower extremity shows no clubbing, cyanosis, or edema.  Skin:  Shows some scattered ecchymosis.  No petechia noted.  LABORATORY STUDIES:  Show a white cell count of 2.9, hemoglobin 13.1, hematocrit 38.1, platelet count 75,000. MCV is 91.  Peripheral smear shows mild anisocytosis and poikilocytosis.  No nucleated red cells are noted.  There is no rouleaux formation.  There are no target cells.  White cells are decreased in number.  He has no hypersegmented polys.  There is no immature myeloid or lymphoid forms. There are no atypical lymphocytes.  Platelets are decreased in number. He has several large platelets.  Platelets appear fairly well granulated.  IMPRESSION:  Oscar [name] is a 48 year old gentleman.  He has chronic immune thrombocytopenia.  We have not had to treat this as of yet because he has had no issues.  However, now, we have to get this treated against platelet count above 100,000.  With him having spine surgery, I do not want to take any chances of him bleeding.  As such, he needs to get his platelet count above 100,000.  I believe that the best way for [place] to do this is with Nplate.  Again, I do not want to use steroids given his risk of GI bleeding.  I do not think that IVIG will work quickly enough.  I do not want to delay his  surgery given the potential for neurological compromise.  We will try to get Nplate started on him.  Hopefully, he will only need a couple of doses to get his platelet count above 100,000.  We will be intact with Dr. Arnoldo Morale, his surgeon, we will let him know what we want to do.  Hopefully, we can start the Nplate in a couple of days.  I will want to try to give him a couple doses if possible, depending on his response.  I spent good 45 minutes with Oscar [name] and his wife.  I reviewed his lab work with him.  I explained to him the options that we have and why we have to go with the Nplate.  He  understands this and wishes to proceed, so that he can have a surgery to help alleviate the significant pain that he is having in his left shoulder and arm.    ______________________________ Volanda Napoleon, M.D. PRE/MEDQ  D:  05/16/2013  T:  05/17/2013  Job:  [id number]

## 2013-05-21 ENCOUNTER — Encounter (HOSPITAL_COMMUNITY): Payer: Self-pay | Admitting: Pharmacy Technician

## 2013-05-21 ENCOUNTER — Other Ambulatory Visit: Payer: Self-pay | Admitting: *Deleted

## 2013-05-21 ENCOUNTER — Other Ambulatory Visit (HOSPITAL_BASED_OUTPATIENT_CLINIC_OR_DEPARTMENT_OTHER): Payer: BC Managed Care – PPO | Admitting: Lab

## 2013-05-21 ENCOUNTER — Ambulatory Visit (HOSPITAL_BASED_OUTPATIENT_CLINIC_OR_DEPARTMENT_OTHER): Payer: BC Managed Care – PPO

## 2013-05-21 VITALS — BP 112/73 | HR 70 | Temp 96.9°F

## 2013-05-21 DIAGNOSIS — D693 Immune thrombocytopenic purpura: Secondary | ICD-10-CM

## 2013-05-21 DIAGNOSIS — D649 Anemia, unspecified: Secondary | ICD-10-CM

## 2013-05-21 DIAGNOSIS — D61818 Other pancytopenia: Secondary | ICD-10-CM

## 2013-05-21 LAB — CBC WITH DIFFERENTIAL (CANCER CENTER ONLY)
BASO#: 0 10*3/uL (ref 0.0–0.2)
BASO%: 0.4 % (ref 0.0–2.0)
EOS%: 3.9 % (ref 0.0–7.0)
Eosinophils Absolute: 0.1 10*3/uL (ref 0.0–0.5)
HEMATOCRIT: 36.8 % — AB (ref 38.7–49.9)
HGB: 12.8 g/dL — ABNORMAL LOW (ref 13.0–17.1)
LYMPH#: 0.9 10*3/uL (ref 0.9–3.3)
LYMPH%: 33.2 % (ref 14.0–48.0)
MCH: 31.2 pg (ref 28.0–33.4)
MCHC: 34.8 g/dL (ref 32.0–35.9)
MCV: 90 fL (ref 82–98)
MONO#: 0.3 10*3/uL (ref 0.1–0.9)
MONO%: 11.6 % (ref 0.0–13.0)
NEUT#: 1.3 10*3/uL — ABNORMAL LOW (ref 1.5–6.5)
NEUT%: 50.9 % (ref 40.0–80.0)
Platelets: 77 10*3/uL — ABNORMAL LOW (ref 145–400)
RBC: 4.1 10*6/uL — ABNORMAL LOW (ref 4.20–5.70)
RDW: 12.5 % (ref 11.1–15.7)
WBC: 2.6 10*3/uL — ABNORMAL LOW (ref 4.0–10.0)

## 2013-05-21 MED ORDER — ROMIPLOSTIM 250 MCG ~~LOC~~ SOLR: 1.0000 ug/kg | Freq: Once | SUBCUTANEOUS | Status: DC

## 2013-05-21 MED ORDER — ROMIPLOSTIM 250 MCG ~~LOC~~ SOLR
120.0000 ug | Freq: Once | SUBCUTANEOUS | Status: AC
  Administered 2013-05-21: 120 ug via SUBCUTANEOUS
  Filled 2013-05-21: qty 0.24

## 2013-05-21 NOTE — Patient Instructions (Signed)

## 2013-05-21 NOTE — Progress Notes (Signed)
Patient had "8" on pain scale.  Takes percocet at home.  Offered to give patient percocet here with order but patient just took 2 Tylenol 1 hour ago and did not want to give more Tylenol.  PAtient will take when he gets home

## 2013-05-23 ENCOUNTER — Encounter (HOSPITAL_COMMUNITY): Payer: Self-pay

## 2013-05-23 ENCOUNTER — Encounter (HOSPITAL_COMMUNITY)
Admission: RE | Admit: 2013-05-23 | Discharge: 2013-05-23 | Disposition: A | Discharge status: Home / Self Care | Payer: BC Managed Care – PPO | Source: Ambulatory Visit | Attending: Neurosurgery | Admitting: Neurosurgery

## 2013-05-23 ENCOUNTER — Ambulatory Visit (HOSPITAL_COMMUNITY)
Admission: RE | Admit: 2013-05-23 | Discharge: 2013-05-23 | Disposition: A | Discharge status: Home / Self Care | Payer: BC Managed Care – PPO | Source: Ambulatory Visit | Attending: Anesthesiology | Admitting: Anesthesiology

## 2013-05-23 DIAGNOSIS — I10 Essential (primary) hypertension: Secondary | ICD-10-CM | POA: Insufficient documentation

## 2013-05-23 HISTORY — DX: Esophageal varices without bleeding: I85.00

## 2013-05-23 HISTORY — DX: Constipation, unspecified: K59.00

## 2013-05-23 HISTORY — DX: Personal history of other infectious and parasitic diseases: Z86.19

## 2013-05-23 HISTORY — DX: Anesthesia of skin: R20.0

## 2013-05-23 HISTORY — DX: Personal history of colonic polyps: Z86.010

## 2013-05-23 HISTORY — DX: Gastric varices: I86.4

## 2013-05-23 HISTORY — DX: Personal history of colon polyps, unspecified: Z86.0100

## 2013-05-23 HISTORY — DX: Frequency of micturition: R35.0

## 2013-05-23 HISTORY — DX: Gastrointestinal hemorrhage, unspecified: K92.2

## 2013-05-23 HISTORY — DX: Inflammatory liver disease, unspecified: K75.9

## 2013-05-23 HISTORY — DX: Sleep apnea, unspecified: G47.30

## 2013-05-23 HISTORY — DX: Insomnia, unspecified: G47.00

## 2013-05-23 HISTORY — DX: Cervicalgia: M54.2

## 2013-05-23 LAB — PROTIME-INR
INR: 1.14 (ref 0.00–1.49)
Prothrombin Time: 14.4 seconds (ref 11.6–15.2)

## 2013-05-23 LAB — APTT: aPTT: 36 seconds (ref 24–37)

## 2013-05-23 LAB — CBC
HCT: 38.8 % — ABNORMAL LOW (ref 39.0–52.0)
Hemoglobin: 14.2 g/dL (ref 13.0–17.0)
MCH: 32.1 pg (ref 26.0–34.0)
MCHC: 36.6 g/dL — AB (ref 30.0–36.0)
MCV: 87.6 fL (ref 78.0–100.0)
PLATELETS: 112 10*3/uL — AB (ref 150–400)
RBC: 4.43 MIL/uL (ref 4.22–5.81)
RDW: 12.7 % (ref 11.5–15.5)
WBC: 3.6 10*3/uL — AB (ref 4.0–10.5)

## 2013-05-23 LAB — COMPREHENSIVE METABOLIC PANEL
ALBUMIN: 3.3 g/dL — AB (ref 3.5–5.2)
ALK PHOS: 164 U/L — AB (ref 39–117)
ALT: 32 U/L (ref 0–53)
AST: 38 U/L — AB (ref 0–37)
BILIRUBIN TOTAL: 0.9 mg/dL (ref 0.3–1.2)
BUN: 10 mg/dL (ref 6–23)
CHLORIDE: 95 meq/L — AB (ref 96–112)
CO2: 26 mEq/L (ref 19–32)
Calcium: 9.7 mg/dL (ref 8.4–10.5)
Creatinine, Ser: 0.81 mg/dL (ref 0.50–1.35)
GFR calc Af Amer: >90 mL/min (ref >90)
GFR calc non Af Amer: >90 mL/min (ref >90)
Glucose, Bld: 385 mg/dL — ABNORMAL HIGH (ref 70–99)
POTASSIUM: 4.5 meq/L (ref 3.7–5.3)
SODIUM: 135 meq/L — AB (ref 137–147)
Total Protein: 7.6 g/dL (ref 6.0–8.3)

## 2013-05-23 LAB — SURGICAL PCR SCREEN
MRSA, PCR: NEGATIVE
STAPHYLOCOCCUS AUREUS: NEGATIVE

## 2013-05-23 NOTE — Pre-Procedure Instructions (Signed)
Oscar [name]  05/23/2013   Your procedure is scheduled on:  Wed, Feb 4 @ 11:25 AM  Report to [facility]  at 8:30 AM.  Call this number if you have problems the morning of surgery: [phone]   Remember:   Do not eat food or drink liquids after midnight.   Take these medicines the morning of surgery with A SIP OF WATER: Atenolol(Tenormin),Esomeprazole(Nexium),Inderal(Propranolol),and Pain Pill(if needed)                No Goody's,BC's,Aleve,Aspirin,Ibuprofen,Fish Oil,or any Herbal Medications   Do not wear jewelry  Do not wear lotions, powders, or colognes. You may wear deodorant.  Men may shave face and neck.  Do not bring valuables to the hospital.  [facility] is not responsible                  for any belongings or valuables.               Contacts, dentures or bridgework may not be worn into surgery.  Leave suitcase in the car. After surgery it may be brought to your room.  For patients admitted to the hospital, discharge time is determined by your                treatment team.                  Special Instructions: Shower using CHG 2 nights before surgery and the night before surgery.  If you shower the day of surgery use CHG.  Use special wash - you have one bottle of CHG for all showers.  You should use approximately 1/3 of the bottle for each shower.   Please read over the following fact sheets that you were given: Pain Booklet, Coughing and Deep Breathing, MRSA Information and Surgical Site Infection Prevention

## 2013-05-23 NOTE — Progress Notes (Signed)
Pt doesn't have a cardiologist  Denies ever having an echo/stress test/heart cath  Medical MD Dr.Mary Baxley  Denies EKG or CXR in past yr or ever being done

## 2013-05-23 NOTE — Progress Notes (Signed)
Nplate first injection was on Mon, Jan 26 and next one for 05/28/13

## 2013-05-24 ENCOUNTER — Encounter: Payer: Self-pay | Admitting: Hematology & Oncology

## 2013-05-24 NOTE — Progress Notes (Addendum)
Anesthesia Chart Review:  Patient is a 48 year old male scheduled for C5-6 ACDF on 05/30/13 by Dr. Arnoldo Morale.  History includes non-smoker, obesity, HTN, ITP, cirrhosis (likely non-alcoholic steatohepatitis/alcohol use) with  esophageal and gastric varies, Hepatitis A, GERD, OSA without CPAP, DM2.  His PCP is Dr. Cresenciano Lick. Baxley who medically cleared patient for this procedure.  Endocrinologist is Dr. Suzette Battiest.  Hematologist is Dr. Marin Olp who cleared patient from a hematology standpoint, but wanted his PLT count > 100K. He had bleeding following endoscopy 02/07/14, so Dr. Marin Olp did not want to give steroids and thought IVIG would not work quickly enough.  Nplate injections were started in hopes that his surgery would not be delayed given the potential for neurological compromise. He received his first injection on 05/21/13 and the next injection is due 05/28/13.  EKG on 05/23/13 showed NSR, minimal voltage criteria for LVH.  CXR on 05/23/13 showed no active cardiopulmonary disease.  Preoperative labs noted.  Non-fasting glucose was 385.  WBC 3.6, H/H 14.2/38.8, PLT 112K, PT/PTT WNL.  I called and spoke with patient.  He requested that I speak with his wife instead (with him in the background).  He is not consistent with checking fasting glucose levels, so he could not tell me what his fasting glucose typically runs.  He did have a Coke just prior to his PAT visit.  Random sugars are typically between 200-300, but have been higher at times.  He is compliant with his DM regimen (Novolog TID with meals, Novolin N 10 Unites HS, Janumet). He thinks his last A1C was ~ 11-12.  I've told them that ideally anesthesia would like his fasting glucose 200 or less.  If his home fasting glucose levels are running much higher than this (particularly > 250) then I have recommended that he contact Dr. Suzette Battiest or Dr. Renold Genta regarding potential DM regimen adjustments prior to surgery to avoid his surgery being delayed or canceled.  I will  update Dr. Adline Mango office tomorrow.    Oscar [name] [facility]/Anesthesiology Phone [phone] 05/24/2013 6:50 PM  Addendum: 05/29/2013 4:22 PM Manuela Schwartz at Dr. Arnoldo Morale' office notified of hyperglycemia at PAT on 05/25/13.  She was going to re-address with him when she notified him of time change.  He will get a fasting CBG on arrival.  PLT count yesterday 130K.

## 2013-05-25 ENCOUNTER — Other Ambulatory Visit: Payer: Self-pay | Admitting: Nurse Practitioner

## 2013-05-25 DIAGNOSIS — D693 Immune thrombocytopenic purpura: Secondary | ICD-10-CM

## 2013-05-28 ENCOUNTER — Ambulatory Visit (HOSPITAL_BASED_OUTPATIENT_CLINIC_OR_DEPARTMENT_OTHER): Payer: BC Managed Care – PPO

## 2013-05-28 ENCOUNTER — Ambulatory Visit (HOSPITAL_BASED_OUTPATIENT_CLINIC_OR_DEPARTMENT_OTHER): Payer: BC Managed Care – PPO | Admitting: Lab

## 2013-05-28 VITALS — BP 107/67 | HR 69 | Temp 97.6°F | Resp 20

## 2013-05-28 DIAGNOSIS — D693 Immune thrombocytopenic purpura: Secondary | ICD-10-CM

## 2013-05-28 DIAGNOSIS — D61818 Other pancytopenia: Secondary | ICD-10-CM

## 2013-05-28 DIAGNOSIS — D649 Anemia, unspecified: Secondary | ICD-10-CM

## 2013-05-28 LAB — CBC WITH DIFFERENTIAL (CANCER CENTER ONLY)
BASO#: 0 10*3/uL (ref 0.0–0.2)
BASO%: 0.4 % (ref 0.0–2.0)
EOS ABS: 0.1 10*3/uL (ref 0.0–0.5)
EOS%: 4.3 % (ref 0.0–7.0)
HCT: 37.5 % — ABNORMAL LOW (ref 38.7–49.9)
HGB: 12.8 g/dL — ABNORMAL LOW (ref 13.0–17.1)
LYMPH#: 0.9 10*3/uL (ref 0.9–3.3)
LYMPH%: 31 % (ref 14.0–48.0)
MCH: 30.3 pg (ref 28.0–33.4)
MCHC: 34.1 g/dL (ref 32.0–35.9)
MCV: 89 fL (ref 82–98)
MONO#: 0.3 10*3/uL (ref 0.1–0.9)
MONO%: 10.1 % (ref 0.0–13.0)
NEUT%: 54.2 % (ref 40.0–80.0)
NEUTROS ABS: 1.5 10*3/uL (ref 1.5–6.5)
Platelets: 130 10*3/uL — ABNORMAL LOW (ref 145–400)
RBC: 4.22 10*6/uL (ref 4.20–5.70)
RDW: 12.2 % (ref 11.1–15.7)
WBC: 2.8 10*3/uL — AB (ref 4.0–10.0)

## 2013-05-28 MED ORDER — ROMIPLOSTIM 250 MCG ~~LOC~~ SOLR
120.0000 ug | Freq: Once | SUBCUTANEOUS | Status: AC
  Administered 2013-05-28: 120 ug via SUBCUTANEOUS
  Filled 2013-05-28: qty 0.24

## 2013-05-28 NOTE — Patient Instructions (Signed)

## 2013-05-29 LAB — NO BLOOD PRODUCTS

## 2013-05-29 MED ORDER — CEFAZOLIN SODIUM-DEXTROSE 2-3 GM-% IV SOLR
2.0000 g | INTRAVENOUS | Status: AC
  Administered 2013-05-30: 2 g via INTRAVENOUS

## 2013-05-30 ENCOUNTER — Encounter (HOSPITAL_COMMUNITY): Payer: BC Managed Care – PPO | Admitting: Vascular Surgery

## 2013-05-30 ENCOUNTER — Encounter (HOSPITAL_COMMUNITY): Payer: Self-pay

## 2013-05-30 ENCOUNTER — Encounter (HOSPITAL_COMMUNITY)
Admission: RE | Disposition: A | Discharge status: Home / Self Care | Payer: Self-pay | Source: Ambulatory Visit | Attending: Neurosurgery

## 2013-05-30 ENCOUNTER — Ambulatory Visit (HOSPITAL_COMMUNITY): Payer: BC Managed Care – PPO | Admitting: Certified Registered Nurse Anesthetist

## 2013-05-30 ENCOUNTER — Inpatient Hospital Stay (HOSPITAL_COMMUNITY)
Admission: RE | Admit: 2013-05-30 | Discharge: 2013-05-31 | DRG: 472 | Disposition: A | Discharge status: Home / Self Care | Payer: BC Managed Care – PPO | Source: Ambulatory Visit | Attending: Neurosurgery | Admitting: Neurosurgery

## 2013-05-30 ENCOUNTER — Ambulatory Visit (HOSPITAL_COMMUNITY): Payer: BC Managed Care – PPO

## 2013-05-30 DIAGNOSIS — M129 Arthropathy, unspecified: Secondary | ICD-10-CM | POA: Diagnosis present

## 2013-05-30 DIAGNOSIS — Z794 Long term (current) use of insulin: Secondary | ICD-10-CM

## 2013-05-30 DIAGNOSIS — Z8619 Personal history of other infectious and parasitic diseases: Secondary | ICD-10-CM

## 2013-05-30 DIAGNOSIS — G4730 Sleep apnea, unspecified: Secondary | ICD-10-CM | POA: Diagnosis present

## 2013-05-30 DIAGNOSIS — E119 Type 2 diabetes mellitus without complications: Secondary | ICD-10-CM | POA: Diagnosis present

## 2013-05-30 DIAGNOSIS — K5900 Constipation, unspecified: Secondary | ICD-10-CM | POA: Diagnosis present

## 2013-05-30 DIAGNOSIS — M5020 Other cervical disc displacement, unspecified cervical region: Secondary | ICD-10-CM

## 2013-05-30 DIAGNOSIS — K219 Gastro-esophageal reflux disease without esophagitis: Secondary | ICD-10-CM | POA: Diagnosis present

## 2013-05-30 DIAGNOSIS — D693 Immune thrombocytopenic purpura: Secondary | ICD-10-CM | POA: Diagnosis present

## 2013-05-30 DIAGNOSIS — Z79899 Other long term (current) drug therapy: Secondary | ICD-10-CM

## 2013-05-30 DIAGNOSIS — I8500 Esophageal varices without bleeding: Secondary | ICD-10-CM | POA: Diagnosis present

## 2013-05-30 DIAGNOSIS — I10 Essential (primary) hypertension: Secondary | ICD-10-CM | POA: Diagnosis present

## 2013-05-30 DIAGNOSIS — K7689 Other specified diseases of liver: Secondary | ICD-10-CM | POA: Diagnosis present

## 2013-05-30 DIAGNOSIS — Z889 Allergy status to unspecified drugs, medicaments and biological substances status: Secondary | ICD-10-CM

## 2013-05-30 DIAGNOSIS — Z860100 Personal history of colon polyps, unspecified: Secondary | ICD-10-CM

## 2013-05-30 DIAGNOSIS — Z7982 Long term (current) use of aspirin: Secondary | ICD-10-CM

## 2013-05-30 HISTORY — PX: ANTERIOR CERVICAL DECOMP/DISCECTOMY FUSION: SHX1161

## 2013-05-30 LAB — GLUCOSE, CAPILLARY
GLUCOSE-CAPILLARY: 207 mg/dL — AB (ref 70–99)
GLUCOSE-CAPILLARY: 233 mg/dL — AB (ref 70–99)
Glucose-Capillary: 214 mg/dL — ABNORMAL HIGH (ref 70–99)
Glucose-Capillary: 264 mg/dL — ABNORMAL HIGH (ref 70–99)
Glucose-Capillary: 278 mg/dL — ABNORMAL HIGH (ref 70–99)

## 2013-05-30 SURGERY — ANTERIOR CERVICAL DECOMPRESSION/DISCECTOMY FUSION 1 LEVEL
Anesthesia: General | Site: Neck

## 2013-05-30 MED ORDER — PHENYLEPHRINE 40 MCG/ML (10ML) SYRINGE FOR IV PUSH (FOR BLOOD PRESSURE SUPPORT)
PREFILLED_SYRINGE | INTRAVENOUS | Status: AC
  Filled 2013-05-30: qty 10

## 2013-05-30 MED ORDER — ONDANSETRON HCL 4 MG/2ML IJ SOLN: 4.0000 mg | Freq: Once | INTRAMUSCULAR | Status: DC | PRN

## 2013-05-30 MED ORDER — ACETAMINOPHEN 325 MG PO TABS: 650.0000 mg | ORAL_TABLET | ORAL | Status: DC | PRN

## 2013-05-30 MED ORDER — HYDROMORPHONE HCL PF 1 MG/ML IJ SOLN
INTRAMUSCULAR | Status: AC
  Filled 2013-05-30: qty 1

## 2013-05-30 MED ORDER — INSULIN ASPART 100 UNIT/ML ~~LOC~~ SOLN
0.0000 [IU] | SUBCUTANEOUS | Status: AC
  Administered 2013-05-30: 11 [IU] via SUBCUTANEOUS

## 2013-05-30 MED ORDER — MIDAZOLAM HCL 2 MG/2ML IJ SOLN
INTRAMUSCULAR | Status: AC
  Filled 2013-05-30: qty 2

## 2013-05-30 MED ORDER — ALUM & MAG HYDROXIDE-SIMETH 200-200-20 MG/5ML PO SUSP: 30.0000 mL | Freq: Four times a day (QID) | ORAL | Status: DC | PRN

## 2013-05-30 MED ORDER — OXYCODONE HCL 5 MG PO TABS: 5.0000 mg | ORAL_TABLET | ORAL | Status: DC

## 2013-05-30 MED ORDER — ARTIFICIAL TEARS OP OINT
TOPICAL_OINTMENT | OPHTHALMIC | Status: DC | PRN
  Administered 2013-05-30: 1 via OPHTHALMIC

## 2013-05-30 MED ORDER — STERILE WATER FOR INJECTION IJ SOLN
INTRAMUSCULAR | Status: AC
  Filled 2013-05-30: qty 10

## 2013-05-30 MED ORDER — SODIUM CHLORIDE 0.9 % IR SOLN
Status: DC | PRN
  Administered 2013-05-30: 09:00:00

## 2013-05-30 MED ORDER — ONDANSETRON HCL 4 MG/2ML IJ SOLN
INTRAMUSCULAR | Status: AC
  Filled 2013-05-30: qty 2

## 2013-05-30 MED ORDER — DIAZEPAM 5 MG PO TABS
ORAL_TABLET | ORAL | Status: AC
  Filled 2013-05-30: qty 1

## 2013-05-30 MED ORDER — OXYCODONE-ACETAMINOPHEN 10-325 MG PO TABS: 1.0000 | ORAL_TABLET | ORAL | Status: DC

## 2013-05-30 MED ORDER — MAGNESIUM GLUCONATE 500 MG PO TABS
500.0000 mg | ORAL_TABLET | Freq: Every day | ORAL | Status: DC
  Administered 2013-05-31: 500 mg via ORAL
  Filled 2013-05-30 (×2): qty 1

## 2013-05-30 MED ORDER — INSULIN NPH (HUMAN) (ISOPHANE) 100 UNIT/ML ~~LOC~~ SUSP
10.0000 [IU] | Freq: Every day | SUBCUTANEOUS | Status: DC
  Administered 2013-05-30: 10 [IU] via SUBCUTANEOUS
  Filled 2013-05-30: qty 10

## 2013-05-30 MED ORDER — LISINOPRIL 20 MG PO TABS
20.0000 mg | ORAL_TABLET | Freq: Every morning | ORAL | Status: DC
  Administered 2013-05-30 – 2013-05-31 (×2): 20 mg via ORAL
  Filled 2013-05-30 (×2): qty 1

## 2013-05-30 MED ORDER — INSULIN ASPART 100 UNIT/ML FLEXPEN
5.0000 [IU] | PEN_INJECTOR | Freq: Three times a day (TID) | SUBCUTANEOUS | Status: DC
  Filled 2013-05-30: qty 3

## 2013-05-30 MED ORDER — PROPOFOL 10 MG/ML IV BOLUS
INTRAVENOUS | Status: AC
  Filled 2013-05-30: qty 20

## 2013-05-30 MED ORDER — DEXAMETHASONE 4 MG PO TABS
4.0000 mg | ORAL_TABLET | Freq: Four times a day (QID) | ORAL | Status: AC
  Administered 2013-05-30: 4 mg via ORAL
  Filled 2013-05-30: qty 1

## 2013-05-30 MED ORDER — PHENYLEPHRINE HCL 10 MG/ML IJ SOLN
INTRAMUSCULAR | Status: DC | PRN
  Administered 2013-05-30 (×5): 80 ug via INTRAVENOUS

## 2013-05-30 MED ORDER — GLYCOPYRROLATE 0.2 MG/ML IJ SOLN
INTRAMUSCULAR | Status: AC
  Filled 2013-05-30: qty 4

## 2013-05-30 MED ORDER — LIDOCAINE HCL 4 % MT SOLN
OROMUCOSAL | Status: DC | PRN
  Administered 2013-05-30: 4 mL via TOPICAL

## 2013-05-30 MED ORDER — METFORMIN HCL 500 MG PO TABS
1000.0000 mg | ORAL_TABLET | Freq: Two times a day (BID) | ORAL | Status: DC
  Administered 2013-05-30 – 2013-05-31 (×2): 1000 mg via ORAL
  Filled 2013-05-30 (×4): qty 2

## 2013-05-30 MED ORDER — ATENOLOL 50 MG PO TABS: 50.0000 mg | ORAL_TABLET | Freq: Every day | ORAL | Status: DC

## 2013-05-30 MED ORDER — PANTOPRAZOLE SODIUM 40 MG PO TBEC
40.0000 mg | DELAYED_RELEASE_TABLET | Freq: Every day | ORAL | Status: DC
  Administered 2013-05-31: 40 mg via ORAL
  Filled 2013-05-30: qty 1

## 2013-05-30 MED ORDER — DEXAMETHASONE SODIUM PHOSPHATE 4 MG/ML IJ SOLN
4.0000 mg | Freq: Four times a day (QID) | INTRAMUSCULAR | Status: AC
  Administered 2013-05-30: 4 mg via INTRAVENOUS
  Filled 2013-05-30: qty 1

## 2013-05-30 MED ORDER — DOCUSATE SODIUM 100 MG PO CAPS
100.0000 mg | ORAL_CAPSULE | Freq: Two times a day (BID) | ORAL | Status: DC
  Administered 2013-05-30 – 2013-05-31 (×3): 100 mg via ORAL
  Filled 2013-05-30 (×4): qty 1

## 2013-05-30 MED ORDER — BACITRACIN ZINC 500 UNIT/GM EX OINT
TOPICAL_OINTMENT | CUTANEOUS | Status: DC | PRN
  Administered 2013-05-30: 1 via TOPICAL

## 2013-05-30 MED ORDER — HYDROMORPHONE HCL PF 1 MG/ML IJ SOLN
0.2500 mg | INTRAMUSCULAR | Status: DC | PRN
  Administered 2013-05-30 (×4): 0.5 mg via INTRAVENOUS

## 2013-05-30 MED ORDER — OXYCODONE-ACETAMINOPHEN 5-325 MG PO TABS: 1.0000 | ORAL_TABLET | ORAL | Status: DC

## 2013-05-30 MED ORDER — HEMOSTATIC AGENTS (NO CHARGE) OPTIME
TOPICAL | Status: DC | PRN
  Administered 2013-05-30: 1 via TOPICAL

## 2013-05-30 MED ORDER — PROPRANOLOL HCL 10 MG PO TABS
10.0000 mg | ORAL_TABLET | Freq: Two times a day (BID) | ORAL | Status: DC
  Administered 2013-05-30 – 2013-05-31 (×2): 10 mg via ORAL
  Filled 2013-05-30 (×3): qty 1

## 2013-05-30 MED ORDER — MORPHINE SULFATE 2 MG/ML IJ SOLN: 1.0000 mg | INTRAMUSCULAR | Status: DC | PRN

## 2013-05-30 MED ORDER — ONDANSETRON HCL 4 MG/2ML IJ SOLN
INTRAMUSCULAR | Status: DC | PRN
  Administered 2013-05-30: 4 mg via INTRAVENOUS

## 2013-05-30 MED ORDER — INSULIN ASPART 100 UNIT/ML ~~LOC~~ SOLN
0.0000 [IU] | SUBCUTANEOUS | Status: DC
  Administered 2013-05-30: 7 [IU] via SUBCUTANEOUS

## 2013-05-30 MED ORDER — DIAZEPAM 5 MG PO TABS
5.0000 mg | ORAL_TABLET | Freq: Four times a day (QID) | ORAL | Status: DC | PRN
  Administered 2013-05-30 – 2013-05-31 (×3): 5 mg via ORAL
  Filled 2013-05-30 (×2): qty 1

## 2013-05-30 MED ORDER — 0.9 % SODIUM CHLORIDE (POUR BTL) OPTIME
TOPICAL | Status: DC | PRN
  Administered 2013-05-30: 1000 mL

## 2013-05-30 MED ORDER — THROMBIN 5000 UNITS EX SOLR
CUTANEOUS | Status: DC | PRN
  Administered 2013-05-30 (×2): 5000 [IU] via TOPICAL

## 2013-05-30 MED ORDER — LINAGLIPTIN 5 MG PO TABS
5.0000 mg | ORAL_TABLET | Freq: Every day | ORAL | Status: DC
  Administered 2013-05-30 – 2013-05-31 (×2): 5 mg via ORAL
  Filled 2013-05-30 (×2): qty 1

## 2013-05-30 MED ORDER — ONDANSETRON HCL 4 MG/2ML IJ SOLN: 4.0000 mg | INTRAMUSCULAR | Status: DC | PRN

## 2013-05-30 MED ORDER — PROPOFOL 10 MG/ML IV BOLUS
INTRAVENOUS | Status: DC | PRN
  Administered 2013-05-30: 200 mg via INTRAVENOUS

## 2013-05-30 MED ORDER — CEFAZOLIN SODIUM-DEXTROSE 2-3 GM-% IV SOLR
2.0000 g | Freq: Three times a day (TID) | INTRAVENOUS | Status: AC
  Administered 2013-05-30 – 2013-05-31 (×2): 2 g via INTRAVENOUS
  Filled 2013-05-30 (×2): qty 50

## 2013-05-30 MED ORDER — LACTATED RINGERS IV SOLN
INTRAVENOUS | Status: DC
  Administered 2013-05-30: 17:00:00 via INTRAVENOUS

## 2013-05-30 MED ORDER — NEOSTIGMINE METHYLSULFATE 1 MG/ML IJ SOLN
INTRAMUSCULAR | Status: DC | PRN
  Administered 2013-05-30: 5 mg via INTRAVENOUS

## 2013-05-30 MED ORDER — DEXTROSE 5 % IV SOLN
10.0000 mg | INTRAVENOUS | Status: DC | PRN
  Administered 2013-05-30: 20 ug/min via INTRAVENOUS

## 2013-05-30 MED ORDER — LIDOCAINE HCL (CARDIAC) 20 MG/ML IV SOLN
INTRAVENOUS | Status: DC | PRN
  Administered 2013-05-30: 100 mg via INTRAVENOUS

## 2013-05-30 MED ORDER — FENTANYL CITRATE 0.05 MG/ML IJ SOLN
INTRAMUSCULAR | Status: AC
  Filled 2013-05-30: qty 5

## 2013-05-30 MED ORDER — FENTANYL CITRATE 0.05 MG/ML IJ SOLN
INTRAMUSCULAR | Status: DC | PRN
  Administered 2013-05-30 (×2): 50 ug via INTRAVENOUS
  Administered 2013-05-30: 100 ug via INTRAVENOUS
  Administered 2013-05-30: 50 ug via INTRAVENOUS

## 2013-05-30 MED ORDER — HYDROCODONE-ACETAMINOPHEN 5-325 MG PO TABS: 1.0000 | ORAL_TABLET | ORAL | Status: DC | PRN

## 2013-05-30 MED ORDER — SUCCINYLCHOLINE CHLORIDE 20 MG/ML IJ SOLN
INTRAMUSCULAR | Status: AC
  Filled 2013-05-30: qty 1

## 2013-05-30 MED ORDER — PHENOL 1.4 % MT LIQD: 1.0000 | OROMUCOSAL | Status: DC | PRN

## 2013-05-30 MED ORDER — ACETAMINOPHEN 650 MG RE SUPP: 650.0000 mg | RECTAL | Status: DC | PRN

## 2013-05-30 MED ORDER — CEFAZOLIN SODIUM-DEXTROSE 2-3 GM-% IV SOLR
2.0000 g | Freq: Three times a day (TID) | INTRAVENOUS | Status: DC
  Filled 2013-05-30 (×2): qty 50

## 2013-05-30 MED ORDER — HYDROCHLOROTHIAZIDE 25 MG PO TABS
25.0000 mg | ORAL_TABLET | Freq: Every day | ORAL | Status: DC
  Administered 2013-05-30 – 2013-05-31 (×2): 25 mg via ORAL
  Filled 2013-05-30 (×2): qty 1

## 2013-05-30 MED ORDER — LIDOCAINE HCL (CARDIAC) 20 MG/ML IV SOLN
INTRAVENOUS | Status: AC
  Filled 2013-05-30: qty 10

## 2013-05-30 MED ORDER — OXYCODONE HCL 5 MG PO TABS
5.0000 mg | ORAL_TABLET | ORAL | Status: DC | PRN
  Administered 2013-05-30 – 2013-05-31 (×5): 5 mg via ORAL
  Filled 2013-05-30 (×5): qty 1

## 2013-05-30 MED ORDER — SITAGLIPTIN PHOS-METFORMIN HCL 50-1000 MG PO TABS: 1.0000 | ORAL_TABLET | Freq: Two times a day (BID) | ORAL | Status: DC

## 2013-05-30 MED ORDER — MENTHOL 3 MG MT LOZG
1.0000 | LOZENGE | OROMUCOSAL | Status: DC | PRN
  Administered 2013-05-31: 3 mg via ORAL
  Filled 2013-05-30: qty 9

## 2013-05-30 MED ORDER — ROCURONIUM BROMIDE 50 MG/5ML IV SOLN
INTRAVENOUS | Status: AC
  Filled 2013-05-30: qty 1

## 2013-05-30 MED ORDER — LACTATED RINGERS IV SOLN
INTRAVENOUS | Status: DC | PRN
  Administered 2013-05-30 (×2): via INTRAVENOUS

## 2013-05-30 MED ORDER — OXYCODONE-ACETAMINOPHEN 5-325 MG PO TABS
1.0000 | ORAL_TABLET | ORAL | Status: DC | PRN
  Administered 2013-05-30 – 2013-05-31 (×5): 1 via ORAL
  Filled 2013-05-30 (×5): qty 1

## 2013-05-30 MED ORDER — ROCURONIUM BROMIDE 100 MG/10ML IV SOLN
INTRAVENOUS | Status: DC | PRN
  Administered 2013-05-30: 40 mg via INTRAVENOUS
  Administered 2013-05-30: 10 mg via INTRAVENOUS

## 2013-05-30 MED ORDER — INSULIN ASPART 100 UNIT/ML ~~LOC~~ SOLN
0.0000 [IU] | Freq: Three times a day (TID) | SUBCUTANEOUS | Status: DC
  Administered 2013-05-31: 7 [IU] via SUBCUTANEOUS

## 2013-05-30 MED ORDER — EPHEDRINE SULFATE 50 MG/ML IJ SOLN
INTRAMUSCULAR | Status: AC
  Filled 2013-05-30: qty 1

## 2013-05-30 MED ORDER — BUPIVACAINE-EPINEPHRINE PF 0.5-1:200000 % IJ SOLN
INTRAMUSCULAR | Status: DC | PRN
  Administered 2013-05-30: 10 mL

## 2013-05-30 MED ORDER — NEOSTIGMINE METHYLSULFATE 1 MG/ML IJ SOLN
INTRAMUSCULAR | Status: AC
  Filled 2013-05-30: qty 10

## 2013-05-30 MED ORDER — GLYCOPYRROLATE 0.2 MG/ML IJ SOLN
INTRAMUSCULAR | Status: DC | PRN
  Administered 2013-05-30: .8 mg via INTRAVENOUS

## 2013-05-30 SURGICAL SUPPLY — 61 items
BAG DECANTER FOR FLEXI CONT (MISCELLANEOUS) ×2 IMPLANT
BENZOIN TINCTURE PRP APPL 2/3 (GAUZE/BANDAGES/DRESSINGS) ×2 IMPLANT
BIT DRILL NEURO 2X3.1 SFT TUCH (MISCELLANEOUS) ×1 IMPLANT
BLADE SURG 15 STRL LF DISP TIS (BLADE) IMPLANT
BLADE SURG 15 STRL SS (BLADE)
BLADE ULTRA TIP 2M (BLADE) ×2 IMPLANT
BRUSH SCRUB EZ PLAIN DRY (MISCELLANEOUS) ×2 IMPLANT
BUR BARREL STRAIGHT FLUTE 4.0 (BURR) ×2 IMPLANT
BUR MATCHSTICK NEURO 3.0 LAGG (BURR) ×2 IMPLANT
CANISTER SUCT 3000ML (MISCELLANEOUS) ×2 IMPLANT
CLIP TI MEDIUM 6 (CLIP) ×2 IMPLANT
CONT SPEC 4OZ CLIKSEAL STRL BL (MISCELLANEOUS) ×2 IMPLANT
COVER MAYO STAND STRL (DRAPES) ×2 IMPLANT
DRAPE LAPAROTOMY 100X72 PEDS (DRAPES) ×2 IMPLANT
DRAPE MICROSCOPE LEICA (MISCELLANEOUS) IMPLANT
DRAPE POUCH INSTRU U-SHP 10X18 (DRAPES) ×2 IMPLANT
DRAPE PROXIMA HALF (DRAPES) ×2 IMPLANT
DRAPE SURG 17X23 STRL (DRAPES) ×4 IMPLANT
DRILL NEURO 2X3.1 SOFT TOUCH (MISCELLANEOUS) ×2
ELECT REM PT RETURN 9FT ADLT (ELECTROSURGICAL) ×2
ELECTRODE REM PT RTRN 9FT ADLT (ELECTROSURGICAL) ×1 IMPLANT
GAUZE SPONGE 4X4 16PLY XRAY LF (GAUZE/BANDAGES/DRESSINGS) IMPLANT
GLOVE BIO SURGEON STRL SZ8 (GLOVE) ×2 IMPLANT
GLOVE BIO SURGEON STRL SZ8.5 (GLOVE) ×2 IMPLANT
GLOVE BIOGEL PI IND STRL 7.0 (GLOVE) ×1 IMPLANT
GLOVE BIOGEL PI INDICATOR 7.0 (GLOVE) ×1
GLOVE EXAM NITRILE LRG STRL (GLOVE) ×2 IMPLANT
GLOVE EXAM NITRILE MD LF STRL (GLOVE) IMPLANT
GLOVE EXAM NITRILE XL STR (GLOVE) IMPLANT
GLOVE EXAM NITRILE XS STR PU (GLOVE) IMPLANT
GLOVE INDICATOR 8.5 STRL (GLOVE) ×2 IMPLANT
GLOVE SS BIOGEL STRL SZ 8 (GLOVE) ×1 IMPLANT
GLOVE SUPERSENSE BIOGEL SZ 8 (GLOVE) ×1
GLOVE SURG SS PI 7.0 STRL IVOR (GLOVE) ×4 IMPLANT
GOWN BRE IMP SLV AUR LG STRL (GOWN DISPOSABLE) IMPLANT
GOWN BRE IMP SLV AUR XL STRL (GOWN DISPOSABLE) IMPLANT
KIT BASIN OR (CUSTOM PROCEDURE TRAY) ×2 IMPLANT
KIT ROOM TURNOVER OR (KITS) ×2 IMPLANT
MARKER SKIN DUAL TIP RULER LAB (MISCELLANEOUS) ×2 IMPLANT
NEEDLE HYPO 22GX1.5 SAFETY (NEEDLE) ×2 IMPLANT
NEEDLE SPNL 18GX3.5 QUINCKE PK (NEEDLE) ×2 IMPLANT
NS IRRIG 1000ML POUR BTL (IV SOLUTION) ×2 IMPLANT
PACK LAMINECTOMY NEURO (CUSTOM PROCEDURE TRAY) ×2 IMPLANT
PEEK VISTA 14X14X8MM (Peek) ×2 IMPLANT
PIN DISTRACTION 14MM (PIN) ×4 IMPLANT
PLATE ANT CERV XTEND 1 LV 16 (Plate) ×2 IMPLANT
PUTTY 2.5ML ACTIFUSE ABX (Putty) ×2 IMPLANT
RUBBERBAND STERILE (MISCELLANEOUS) IMPLANT
SCREW XTD VAR 4.2 SELF TAP (Screw) ×8 IMPLANT
SPONGE GAUZE 4X4 12PLY (GAUZE/BANDAGES/DRESSINGS) ×2 IMPLANT
SPONGE INTESTINAL PEANUT (DISPOSABLE) ×2 IMPLANT
SPONGE SURGIFOAM ABS GEL SZ50 (HEMOSTASIS) ×2 IMPLANT
STRIP CLOSURE SKIN 1/2X4 (GAUZE/BANDAGES/DRESSINGS) ×2 IMPLANT
SUT VIC AB 0 CT1 27 (SUTURE) ×1
SUT VIC AB 0 CT1 27XBRD ANTBC (SUTURE) ×1 IMPLANT
SUT VIC AB 3-0 SH 8-18 (SUTURE) ×2 IMPLANT
SYR 20ML ECCENTRIC (SYRINGE) ×2 IMPLANT
TAPE CLOTH SURG 4X10 WHT LF (GAUZE/BANDAGES/DRESSINGS) ×2 IMPLANT
TOWEL OR 17X24 6PK STRL BLUE (TOWEL DISPOSABLE) ×2 IMPLANT
TOWEL OR 17X26 10 PK STRL BLUE (TOWEL DISPOSABLE) ×2 IMPLANT
WATER STERILE IRR 1000ML POUR (IV SOLUTION) ×2 IMPLANT

## 2013-05-30 NOTE — Anesthesia Procedure Notes (Signed)
Procedure Name: Intubation Date/Time: 05/30/2013 8:40 AM Performed by: TURNER, SARAH E Pre-anesthesia Checklist: Patient identified, Emergency Drugs available, Suction available and Patient being monitored Patient Re-evaluated:Patient Re-evaluated prior to inductionOxygen Delivery Method: Circle system utilized Preoxygenation: Pre-oxygenation with 100% oxygen Intubation Type: IV induction Ventilation: Oral airway inserted - appropriate to patient size and Mask ventilation without difficulty Laryngoscope Size: Miller and 2 Grade View: Grade I Tube type: Oral Tube size: 7.5 mm Number of attempts: 1 Airway Equipment and Method: Stylet,  Oral airway and LTA kit utilized Placement Confirmation: ETT inserted through vocal cords under direct vision,  positive ETCO2 and breath sounds checked- equal and bilateral Secured at: 22 cm Tube secured with: Tape Dental Injury: Teeth and Oropharynx as per pre-operative assessment      

## 2013-05-30 NOTE — Transfer of Care (Signed)
Immediate Anesthesia Transfer of Care Note  Patient: Oscar [name]  Procedure(s) Performed: Procedure(s) with comments: Cervical five-six anterior cervical decompression with interbody prosthesis plating and bonegraft (N/A) - Cervical five-six anterior cervical decompression with interbody prosthesis plating and bonegraft  Patient Location: PACU  Anesthesia Type:General  Level of Consciousness: awake and alert   Airway & Oxygen Therapy: Patient Spontanous Breathing and Patient connected to nasal cannula oxygen  Post-op Assessment: Report given to PACU RN, Post -op Vital signs reviewed and stable and Patient moving all extremities X 4  Post vital signs: Reviewed and stable  Complications: No apparent anesthesia complications

## 2013-05-30 NOTE — Progress Notes (Signed)
Patient ID: Oscar [name], male   DOB: 1965/11/16, 48 y.o.   MRN: [id number] Subjective:  The patient is alert and pleasant. He looks well. He is in no apparent distress.  Objective: Vital signs in last 24 hours: Temp:  [97.9 F (36.6 C)-98.5 F (36.9 C)] 97.9 F (36.6 C) (02/04 1035) Pulse Rate:  [74-84] 84 (02/04 1035) Resp:  [18] 18 (02/04 1035) BP: (133-134)/(73-79) 133/79 mmHg (02/04 1035) SpO2:  [94 %-98 %] 94 % (02/04 1035)  Intake/Output from previous day:   Intake/Output this shift: Total I/O In: 1500 [I.V.:1500] Out: 100 [Blood:100]  Physical exam the patient is alert and oriented. He is moving all 4 extremities well. His dressing is clean and dry. There is no evidence of hematoma or shift.  Lab Results:  Recent Labs  05/28/13 0952  WBC 2.8*  HGB 12.8*  HCT 37.5*  PLT 130*   BMET No results found for this basename: NA, K, CL, CO2, GLUCOSE, BUN, CREATININE, CALCIUM,  in the last 72 hours  Studies/Results: Dg Cervical Spine 2-3 Views  05/30/2013   CLINICAL DATA:  C5-6 ACDF.  EXAM: CERVICAL SPINE - 2-3 VIEW  COMPARISON:  MR C SPINE W/O CM dated 05/03/2013; CT NECK W/CM dated 02/27/2013  FINDINGS: Three spot fluoroscopic images of the cervical spine are submitted from the operating room. On image number 1, there is an anterior localizing needle at the C2-3 disc space level.  On image number 2, there are skin spreaders anteriorly at C5-6 with a needle projecting over the C4-5 disc space.  On image number 3,, there is limited visualization inferior to the C4-5 disc space. The operative level is not adequately visualized. Surgical sponges are present anteriorly in the lower neck.  IMPRESSION: Intraoperative views during lower cervical fusion. The operative level is insufficiently visualized.   Electronically Signed   By: Camie Patience M.D.   On: 05/30/2013 10:44    Assessment/Plan: The patient is doing well.  LOS: 0 days     Benjamyn Hestand D 05/30/2013, 10:48  AM

## 2013-05-30 NOTE — H&P (Signed)
Subjective: The patient is a 48 year old white male who has complained of neck and left arm pain. He has failed medical management and was worked up with a cervical MRI. This demonstrated a herniated disc at C5-6. I discussed the various treatment options with the patient including surgery. The patient has weighed the risks, benefits, and alternatives surgery and decided proceed with a C5-6 anterior cervicectomy, fusion, and plating.   Past Medical History  Diagnosis Date  . Fatty liver   . Obesity   . ITP (idiopathic thrombocytopenic purpura) 05/16/2013  . History of colon polyps   . Hypertension     takes Atenolol,HCTZ,and Lisinopril daily  . Gastric varices     takes Propranlol for this  . Sleep apnea     sleep study in epic from 2006 but doesn't use a cpap  . Numbness     and tingling  . Arthritis     osteo  . Neck pain     HNP and radiculpathy  . Bruises easily   . GERD (gastroesophageal reflux disease)     takes Nexium daily  . GI bleed   . Esophageal varices   . Urinary frequency   . Pancytopenia     but never had a blood transfusion  . Hepatitis     hx of Hep A  . Diabetes mellitus     takes Novolin and Novolog and takes Janumet daily  . Constipation     related to pain meds and takes Colace daily  . Insomnia     but doesn't take any meds  . History of staph infection     Past Surgical History  Procedure Laterality Date  . Esophagogastroduodenoscopy (egd) with propofol N/A 02/07/2013    Procedure: ESOPHAGOGASTRODUODENOSCOPY (EGD) WITH PROPOFOL;  Surgeon: Arta Silence, MD;  Location: [facility] ENDOSCOPY;  Service: Endoscopy;  Laterality: N/A;  . Colonoscopy with propofol N/A 02/07/2013    Procedure: COLONOSCOPY WITH PROPOFOL;  Surgeon: Arta Silence, MD;  Location: [facility] ENDOSCOPY;  Service: Endoscopy;  Laterality: N/A;  . I&d of jaw  02/2013  . Wisdom teeth extracted      Allergies  Allergen Reactions  . Byetta 10 Mcg Pen [Exenatide] Nausea And Vomiting    History   Substance Use Topics  . Smoking status: Never Smoker   . Smokeless tobacco: Never Used  . Alcohol Use: No     Comment: quit drinking compeletely in Oct 2014    History reviewed. No pertinent family history. Prior to Admission medications   Medication Sig Start Date End Date Taking? Authorizing Provider  acetaminophen (TYLENOL) 500 MG tablet Take 500 mg by mouth every 6 (six) hours as needed for moderate pain.   Yes Historical Provider, MD  aspirin 325 MG tablet Take 325 mg by mouth every other day.    Yes Historical Provider, MD  atenolol (TENORMIN) 50 MG tablet Take 1 tablet (50 mg total) by mouth daily. 04/27/13  Yes Elby Showers, MD  docusate sodium (COLACE) 100 MG capsule Take 100 mg by mouth daily.   Yes Historical Provider, MD  esomeprazole (NEXIUM) 20 MG capsule Take 20 mg by mouth daily before breakfast.   Yes Historical Provider, MD  hydrochlorothiazide (HYDRODIURIL) 25 MG tablet Take 1 tablet (25 mg total) by mouth daily. 04/27/13  Yes Elby Showers, MD  lisinopril (PRINIVIL,ZESTRIL) 20 MG tablet Take 1 tablet (20 mg total) by mouth every morning. 12/18/12  Yes Elby Showers, MD  magnesium gluconate (MAGONATE) 500 MG tablet Take  500 mg by mouth daily.   Yes Historical Provider, MD  NOVOLIN N 100 UNIT/ML injection Inject 10 Units into the skin at bedtime.  10/31/12  Yes Historical Provider, MD  NOVOLOG FLEXPEN 100 UNIT/ML SOPN FlexPen Inject 5 Units into the skin 3 (three) times daily with meals.  10/12/12  Yes Historical Provider, MD  oxyCODONE-acetaminophen (PERCOCET) 10-325 MG per tablet Take 1 tablet by mouth every 4 (four) hours.  05/11/13  Yes Historical Provider, MD  propranolol (INDERAL) 10 MG tablet Take 10 mg by mouth 2 (two) times daily.   Yes Historical Provider, MD  sitaGLIPtan-metformin (JANUMET) 50-1000 MG per tablet Take 1 tablet by mouth 2 (two) times daily with a meal. 05/30/12  Yes Elby Showers, MD     Review of Systems  Positive ROS: As above  All other systems have  been reviewed and were otherwise negative with the exception of those mentioned in the HPI and as above.  Objective: Vital signs in last 24 hours: Temp:  [98.5 F (36.9 C)] 98.5 F (36.9 C) (02/04 0627) Pulse Rate:  [74] 74 (02/04 0627) Resp:  [18] 18 (02/04 0627) BP: (134)/(73) 134/73 mmHg (02/04 0627) SpO2:  [98 %] 98 % (02/04 0627)  General Appearance: Alert, cooperative, no distress, Head: Normocephalic, without obvious abnormality, atraumatic Eyes: PERRL, conjunctiva/corneas clear, EOM's intact,    Ears: Normal  Throat: Normal  Neck: Supple, symmetrical, trachea midline, no adenopathy; thyroid: No enlargement/tenderness/nodules; no carotid bruit or JVD Back: Symmetric, no curvature, ROM normal, no CVA tenderness Lungs: Clear to auscultation bilaterally, respirations unlabored Heart: Regular rate and rhythm, no murmur, rub or gallop Abdomen: Soft, non-tender,, no masses, no organomegaly Extremities: Extremities normal, atraumatic, no cyanosis or edema Pulses: 2+ and symmetric all extremities Skin: Skin color, texture, turgor normal, no rashes or lesions  NEUROLOGIC:   Mental status: alert and oriented, no aphasia, good attention span, Fund of knowledge/ memory ok Motor Exam - grossly normal Sensory Exam - grossly normal Reflexes:  Coordination - grossly normal Gait - grossly normal Balance - grossly normal Cranial Nerves: I: smell Not tested  II: visual acuity  OS: Normal  OD: Normal   II: visual fields Full to confrontation  II: pupils Equal, round, reactive to light  III,VII: ptosis None  III,IV,VI: extraocular muscles  Full ROM  V: mastication Normal  V: facial light touch sensation  Normal  V,VII: corneal reflex  Present  VII: facial muscle function - upper  Normal  VII: facial muscle function - lower Normal  VIII: hearing Not tested  IX: soft palate elevation  Normal  IX,X: gag reflex Present  XI: trapezius strength  5/5  XI: sternocleidomastoid strength  5/5  XI: neck flexion strength  5/5  XII: tongue strength  Normal    Data Review Lab Results  Component Value Date   WBC 2.8* 05/28/2013   HGB 12.8* 05/28/2013   HCT 37.5* 05/28/2013   MCV 89 05/28/2013   PLT 130* 05/28/2013   Lab Results  Component Value Date   NA 135* 05/23/2013   K 4.5 05/23/2013   CL 95* 05/23/2013   CO2 26 05/23/2013   BUN 10 05/23/2013   CREATININE 0.81 05/23/2013   GLUCOSE 385* 05/23/2013   Lab Results  Component Value Date   INR 1.14 05/23/2013    Assessment/Plan: C5-6 herniated disc, cervicalgia, cervical radiculopathy: I discussed situation with the patient. I reviewed his imaging studies with them and pointed out the abnormalities. We have discussed the various treatment  options including a C5-6 anterior cervical discectomy, fusion, and plating. I described the surgery to him. I've shown him surgical models. We have discussed the risks, benefits, alternatives, and likelihood of achieving our goals with surgery. I have answered all the patient's questions. He has decided to proceed with surgery.   Read Bonelli D 05/30/2013 8:21 AM

## 2013-05-30 NOTE — Preoperative (Signed)
Beta Blockers   Reason not to administer Beta Blockers:Not Applicable, patient took Atenolol and Propanolol 05/30/13.

## 2013-05-30 NOTE — Anesthesia Postprocedure Evaluation (Signed)
  Anesthesia Post-op Note  Patient: Oscar [name]  Procedure(s) Performed: Procedure(s) with comments: Cervical five-six anterior cervical decompression with interbody prosthesis plating and bonegraft (N/A) - Cervical five-six anterior cervical decompression with interbody prosthesis plating and bonegraft  Patient Location: PACU  Anesthesia Type:General  Level of Consciousness: awake, alert , oriented and patient cooperative  Airway and Oxygen Therapy: Patient Spontanous Breathing  Post-op Pain: mild  Post-op Assessment: Post-op Vital signs reviewed, Patient's Cardiovascular Status Stable, Respiratory Function Stable, Patent Airway, No signs of Nausea or vomiting and Pain level controlled  Post-op Vital Signs: stable  Complications: No apparent anesthesia complications

## 2013-05-30 NOTE — Anesthesia Preprocedure Evaluation (Addendum)
Anesthesia Evaluation  Patient identified by MRN, date of birth, ID band Patient awake    Reviewed: Allergy & Precautions, H&P , NPO status , Patient's Chart, lab work & pertinent test results, reviewed documented beta blocker date and time   Airway Mallampati: III TM Distance: >3 FB Neck ROM: Full    Dental  (+) Dental Advisory Given and Teeth Intact   Pulmonary sleep apnea ,          Cardiovascular hypertension, Pt. on medications and Pt. on home beta blockers     Neuro/Psych    GI/Hepatic GERD-  Medicated,(+) Hepatitis -, A  Endo/Other  diabetes, Type 2, Insulin Dependent and Oral Hypoglycemic AgentsMorbid obesity  Renal/GU      Musculoskeletal  (+) Arthritis -,   Abdominal   Peds  Hematology  (+) Blood dyscrasia, ,   Anesthesia Other Findings   Reproductive/Obstetrics                         Anesthesia Physical Anesthesia Plan  ASA: III  Anesthesia Plan: General   Post-op Pain Management:    Induction: Intravenous  Airway Management Planned: Oral ETT  Additional Equipment:   Intra-op Plan:   Post-operative Plan: Extubation in OR  Informed Consent: I have reviewed the patients History and Physical, chart, labs and discussed the procedure including the risks, benefits and alternatives for the proposed anesthesia with the patient or authorized representative who has indicated his/her understanding and acceptance.   Dental advisory given  Plan Discussed with: CRNA, Anesthesiologist and Surgeon  Anesthesia Plan Comments:         Anesthesia Quick Evaluation

## 2013-05-30 NOTE — Op Note (Signed)
Brief history: The patient is a 48 year old white male who has complained of neck and left arm pain consistent with a left C6 radiculopathy. He has failed medical management and was worked up with a cervical MRI. This demonstrated a herniated disc at C5-C6. I discussed the various treatment options with the patient including surgery. He has weighed the risks, benefits, and alternatives surgery and decided proceed with a C5-6 anterior cervical discectomy, fusion, and plating.  Preoperative diagnosis: C5-6 herniated disc, cervicalgia, cervical radiculopathy  Postoperative diagnosis: The same  Procedure: C5-6 Anterior cervical discectomy/decompression; C5-C6 interbody arthrodesis with local morcellized autograft bone and Actifuse bone graft extender; insertion of interbody prosthesis at C5-6 (Zimmer peek interbody prosthesis); anterior cervical plating from C5-6 with globus titanium plate  Surgeon: Dr. Earle Gell  Asst.: Dr. Dominica Severin cram  Anesthesia: Gen. endotracheal  Estimated blood loss: 100 cc  Drains: None  Complications: None  Description of procedure: The patient was brought to the operating room by the anesthesia team. General endotracheal anesthesia was induced. A roll was placed under the patient's shoulders to keep the neck in the neutral position. The patient's anterior cervical region was then prepared with Betadine scrub and Betadine solution. Sterile drapes were applied.  The area to be incised was then injected with Marcaine with epinephrine solution. I then used a scalpel to make a transverse incision in the patient's left anterior neck. I used the Metzenbaum scissors to divide the platysmal muscle and then to dissect medial to the sternocleidomastoid muscle, jugular vein, and carotid artery. I carefully dissected down towards the anterior cervical spine identifying the esophagus and retracting it medially. Then using Kitner swabs to clear soft tissue from the anterior cervical  spine. We then inserted a bent spinal needle into the upper exposed intervertebral disc space. We then obtained intraoperative radiographs confirm our location.  I then used electrocautery to detach the medial border of the longus colli muscle bilaterally from the C5-6 intervertebral disc spaces. I then inserted the Caspar self-retaining retractor underneath the longus colli muscle bilaterally to provide exposure.  We then incised the intervertebral disc at C5-6. We then performed a partial intervertebral discectomy with a pituitary forceps and the Karlin curettes. I then inserted distraction screws into the vertebral bodies at C5 and C6. We then distracted the interspace. We then used the high-speed drill to decorticate the vertebral endplates at E5-2, to drill away the remainder of the intervertebral disc, to drill away some posterior spondylosis, and to thin out the posterior longitudinal ligament. I then incised ligament with the arachnoid knife. We then removed the ligament with a Kerrison punches undercutting the vertebral endplates and decompressing the thecal sac. We then performed foraminotomies about the bilateral C6 nerve roots. This completed the decompression at this level.   We now turned our to attention to the interbody fusion. We used the trial spacers to determine the appropriate size for the interbody prosthesis. We then pre-filled prosthesis with a combination of local morcellized autograft bone that we obtained during decompression as well as Actifuse bone graft extender. We then inserted the prosthesis into the distracted interspace at C5-6. We then removed the distraction screws. There was a good snug fit of the prosthesis in the interspace.   Having completed the fusion we now turned attention to the anterior spinal instrumentation. We used the high-speed drill to drill away some anterior spondylosis at the disc spaces so that the plate lay down flat. We selected the appropriate  length titanium anterior cervical plate.  We laid it along the anterior aspect of the vertebral bodies from C5 and C6. We then drilled 14 mm holes at C5 and C6. We then secured the plate to the vertebral bodies by placing two 14 mm self-tapping screws at C5 and C6. We then obtained intraoperative radiograph. The demonstrating good position of the instrumentation. We therefore secured the screws the plate the locking each cam. This completed the instrumentation.  We then obtained hemostasis using bipolar electrocautery. We irrigated the wound out with bacitracin solution. We then removed the retractor. We inspected the esophagus for any damage. There was none apparent. We then reapproximated patient's platysmal muscle with interrupted 3-0 Vicryl suture. We then reapproximated the subcutaneous tissue with interrupted 3-0 Vicryl suture. The skin was reapproximated with Steri-Strips and benzoin. The wound was then covered with bacitracin ointment. A sterile dressing was applied. The drapes were removed. Patient was subsequently extubated by the anesthesia team and transported to the post anesthesia care unit in stable condition. All sponge instrument and needle counts were reportedly correct at the end of this case.

## 2013-05-31 LAB — GLUCOSE, CAPILLARY: Glucose-Capillary: 204 mg/dL — ABNORMAL HIGH (ref 70–99)

## 2013-05-31 MED ORDER — OXYCODONE-ACETAMINOPHEN 10-325 MG PO TABS: 1.0000 | ORAL_TABLET | ORAL | Status: DC | PRN

## 2013-05-31 MED ORDER — DSS 100 MG PO CAPS: 100.0000 mg | ORAL_CAPSULE | Freq: Two times a day (BID) | ORAL | Status: DC

## 2013-05-31 MED ORDER — DIAZEPAM 5 MG PO TABS: 5.0000 mg | ORAL_TABLET | Freq: Four times a day (QID) | ORAL | Status: DC | PRN

## 2013-05-31 NOTE — Progress Notes (Signed)
Pt doing well. Pt and wife given D/C instructions with Rx's verbal understanding was given. Pt D/C home via wheelchair per MD order. Pt was stable at D/C and had no other needs. Holli Humbles, RN

## 2013-05-31 NOTE — Discharge Summary (Signed)
Physician Discharge Summary  Patient ID: STEFFEN [name] MRN: [id number] DOB/AGE: [birth year] 48 y.o.  Admit date: 05/30/2013 Discharge date: 05/31/2013  Admission Diagnoses: C5-6 herniated disc, cervicalgia, cervical radiculopathy  Discharge Diagnoses: The same Active Problems:   Cervical herniated disc   Discharged Condition: good  Hospital Course: I performed a C5-6 anterior cervical discectomy, fusion, and plating on the patient on 05/30/2013. The surgery went well. The patient's postoperative course was unremarkable. On postop day #1 the patient requested discharge to home. He, and his wife, were given oral and written discharge instructions. All their questions were answered.  Consults: None Significant Diagnostic Studies: None Treatments: C5-C6 anterior cervical discectomy, fusion, and plating Discharge Exam: Blood pressure 118/73, pulse 73, temperature 98.5 F (36.9 C), temperature source Oral, resp. rate 16, SpO2 94.00%. The patient is alert and pleasant. He looks well. His dressing is clean and dry. There is no evidence of hematoma or shift. The patient's strength is normal. His arm pain is gone.  Disposition: Home  Discharge Orders   Future Appointments Provider Department Dept Phone   06/15/2013 11:15 AM Gwendolyn Rincon [phone]   06/15/2013 11:45 AM Volanda Napoleon, MD [facility] [phone]   07/31/2013 3:30 PM Bernie Covey, RN [facility] [phone]   Future Orders Complete By Expires   Call MD for:  difficulty breathing, headache or visual disturbances  As directed    Call MD for:  extreme fatigue  As directed    Call MD for:  hives  As directed    Call MD for:  persistant dizziness or light-headedness  As directed    Call MD for:  persistant nausea and vomiting  As directed    Call MD for:  redness, tenderness, or signs of infection (pain,  swelling, redness, odor or green/yellow discharge around incision site)  As directed    Call MD for:  severe uncontrolled pain  As directed    Call MD for:  temperature >100.4  As directed    Diet - low sodium heart healthy  As directed    Discharge instructions  As directed    Comments:     Call [phone] for a followup appointment. Take a stool softener while you are using pain medications.   Driving Restrictions  As directed    Comments:     Do not drive for 2 weeks.   Increase activity slowly  As directed    Lifting restrictions  As directed    Comments:     Do not lift more than 5 pounds. No excessive bending or twisting.   May shower / Bathe  As directed    Comments:     He may shower after the pain she is removed 3 days after surgery. Leave the incision alone.   Remove dressing in 48 hours  As directed    Comments:     Your stitches are under the scan and will dissolve by themselves. The Steri-Strips will fall off after you take a few showers. Do not rub back or pick at the wound, Leave the wound alone.       Medication List    STOP taking these medications       acetaminophen 500 MG tablet  Commonly known as:  TYLENOL      TAKE these medications       aspirin 325 MG tablet  Take 325 mg  by mouth every other day.     atenolol 50 MG tablet  Commonly known as:  TENORMIN  Take 1 tablet (50 mg total) by mouth daily.     diazepam 5 MG tablet  Commonly known as:  VALIUM  Take 1 tablet (5 mg total) by mouth every 6 (six) hours as needed for muscle spasms.     docusate sodium 100 MG capsule  Commonly known as:  COLACE  Take 100 mg by mouth daily.     DSS 100 MG Caps  Take 100 mg by mouth 2 (two) times daily.     esomeprazole 20 MG capsule  Commonly known as:  NEXIUM  Take 20 mg by mouth daily before breakfast.     hydrochlorothiazide 25 MG tablet  Commonly known as:  HYDRODIURIL  Take 1 tablet (25 mg total) by mouth daily.     lisinopril 20 MG tablet   Commonly known as:  PRINIVIL,ZESTRIL  Take 1 tablet (20 mg total) by mouth every morning.     magnesium gluconate 500 MG tablet  Commonly known as:  MAGONATE  Take 500 mg by mouth daily.     NOVOLIN N 100 UNIT/ML injection  Generic drug:  insulin NPH Human  Inject 10 Units into the skin at bedtime.     NOVOLOG FLEXPEN 100 UNIT/ML FlexPen  Generic drug:  insulin aspart  Inject 5 Units into the skin 3 (three) times daily with meals.     oxyCODONE-acetaminophen 10-325 MG per tablet  Commonly known as:  PERCOCET  Take 1 tablet by mouth every 4 (four) hours.     oxyCODONE-acetaminophen 10-325 MG per tablet  Commonly known as:  PERCOCET  Take 1 tablet by mouth every 4 (four) hours as needed for pain.     propranolol 10 MG tablet  Commonly known as:  INDERAL  Take 10 mg by mouth 2 (two) times daily.     sitaGLIPtin-metformin 50-1000 MG per tablet  Commonly known as:  JANUMET  Take 1 tablet by mouth 2 (two) times daily with a meal.         Signed: Shreena Baines D 05/31/2013, 12:27 PM

## 2013-06-01 ENCOUNTER — Encounter (HOSPITAL_COMMUNITY): Payer: Self-pay | Admitting: Neurosurgery

## 2013-06-15 ENCOUNTER — Other Ambulatory Visit (HOSPITAL_BASED_OUTPATIENT_CLINIC_OR_DEPARTMENT_OTHER): Payer: BC Managed Care – PPO | Admitting: Lab

## 2013-06-15 ENCOUNTER — Encounter: Payer: Self-pay | Admitting: Hematology & Oncology

## 2013-06-15 ENCOUNTER — Ambulatory Visit (HOSPITAL_BASED_OUTPATIENT_CLINIC_OR_DEPARTMENT_OTHER): Payer: BC Managed Care – PPO | Admitting: Hematology & Oncology

## 2013-06-15 VITALS — BP 105/68 | HR 82 | Temp 97.9°F | Resp 18 | Ht 68.0 in | Wt 236.0 lb

## 2013-06-15 DIAGNOSIS — D693 Immune thrombocytopenic purpura: Secondary | ICD-10-CM

## 2013-06-15 DIAGNOSIS — D649 Anemia, unspecified: Secondary | ICD-10-CM

## 2013-06-15 LAB — CBC WITH DIFFERENTIAL (CANCER CENTER ONLY)
BASO#: 0 10*3/uL (ref 0.0–0.2)
BASO%: 0.3 % (ref 0.0–2.0)
EOS%: 4 % (ref 0.0–7.0)
Eosinophils Absolute: 0.1 10*3/uL (ref 0.0–0.5)
HCT: 37.5 % — ABNORMAL LOW (ref 38.7–49.9)
HEMOGLOBIN: 13 g/dL (ref 13.0–17.1)
LYMPH#: 1.2 10*3/uL (ref 0.9–3.3)
LYMPH%: 37.7 % (ref 14.0–48.0)
MCH: 29.5 pg (ref 28.0–33.4)
MCHC: 34.7 g/dL (ref 32.0–35.9)
MCV: 85 fL (ref 82–98)
MONO#: 0.4 10*3/uL (ref 0.1–0.9)
MONO%: 11.4 % (ref 0.0–13.0)
NEUT%: 46.6 % (ref 40.0–80.0)
NEUTROS ABS: 1.5 10*3/uL (ref 1.5–6.5)
PLATELETS: 93 10*3/uL — AB (ref 145–400)
RBC: 4.41 10*6/uL (ref 4.20–5.70)
RDW: 12.5 % (ref 11.1–15.7)
WBC: 3.2 10*3/uL — AB (ref 4.0–10.0)

## 2013-06-15 NOTE — Progress Notes (Signed)
CC:   Ophelia Charter, M.D. Cresenciano Lick. Renold Genta, M.D. [name], M.D. Jeryl Columbia, M.D.  DIAGNOSES: 1. Immune thrombocytopenia. 2. Leukopenia. 3. Cirrhosis, likely nonalcoholic steatohepatitis/alcohol use. 4. Intermittent iron-deficiency anemia.  CURRENT THERAPY:  Observation.  INTERIM HISTORY:  Mr. Oscar [name] comes in for a followup.  He had his neck surgery. He had fusion of C5-6. This was done on February 4. His platelet count was 130,000. There was no bleeding.  His pain is much better now. There is still some soreness. He has no weakness in the left arm. He has no swelling.  There's been no bleeding. He's had no melena or bright red blood per rectum.  He just feels much better now. He is a lot happier now that is not suffering with pain secondary to the in nerve root compression in the cervical spine.  Again, I told him that his platelet count will really be dependent upon alcohol use. I encouraged him to continue to abstain from alcohol.  PHYSICAL EXAMINATION:  General:  This is a slightly obese white gentleman, in no obvious distress.  Vital Signs:  Temperature of 98.2, pulse 77, respiratory rate 18, blood pressure 110/69.  Weight is 237 pounds.  Head and Neck:  Normocephalic, atraumatic skull.  There are no ocular or oral lesions.  There are no palpable cervical or supraclavicular lymph nodes.  Lungs:  Clear bilaterally.  Cardiac: Regular rate and rhythm with a normal S1 and S2.  There are no murmurs, rubs, or bruits.  Abdomen:  Slightly obese.  Abdomen is soft.  No fluid wave is noted.  There is no palpable abdominal mass.  There is no palpable hepatosplenomegaly.  Back:  No tenderness over the spine, ribs, or hips.  There are some spasms in the cervical muscles on the left. Extremities:  Show some slight weakness in the left arm.  He has decent range motion of the joints.  Lower extremity shows no clubbing, cyanosis, or edema.  Skin:  Shows some scattered ecchymosis.   No petechia noted.  LABORATORY STUDIES:  Show a white cell count of 3.2, hemoglobin 13.0, hematocrit 37.5, platelet count 93,000. MCV is 91.  Peripheral smear shows mild anisocytosis and poikilocytosis.  No nucleated red cells are noted.  There is no rouleaux formation.  There are no target cells.  White cells are decreased in number.  He has no hypersegmented polys.  There is no immature myeloid or lymphoid forms. There are no atypical lymphocytes.  Platelets are decreased in number. He has several large platelets.  Platelets appear fairly well granulated.  IMPRESSION:  Mr. Oscar [name] is a 48 year old gentleman.  He has chronic immune thrombocytopenia.    He had his neck surgery. He did very well with this. His platelet count was up to 130,000. There is no pain in the left arm now.  He is supposed to have a follow up colonoscopy in the next few weeks. From my point of view I don't see any issues with him bleeding. I think that if his platelet count went down to 60,000, and this should be okay.  I don't think we have to see back now but in 3 months. I really think that if there are any issues between now and his next appointment, we can always get him in for follow up.

## 2013-07-19 ENCOUNTER — Other Ambulatory Visit: Payer: Self-pay

## 2013-07-19 MED ORDER — SITAGLIPTIN PHOS-METFORMIN HCL 50-1000 MG PO TABS: 1.0000 | ORAL_TABLET | Freq: Two times a day (BID) | ORAL | Status: DC

## 2013-07-31 ENCOUNTER — Ambulatory Visit: Payer: BC Managed Care – PPO | Admitting: *Deleted

## 2013-08-21 ENCOUNTER — Other Ambulatory Visit: Payer: Self-pay | Admitting: Gastroenterology

## 2013-08-21 DIAGNOSIS — K7460 Unspecified cirrhosis of liver: Secondary | ICD-10-CM

## 2013-08-23 ENCOUNTER — Encounter: Payer: Self-pay | Admitting: Internal Medicine

## 2013-08-23 ENCOUNTER — Other Ambulatory Visit: Payer: Self-pay

## 2013-08-27 ENCOUNTER — Other Ambulatory Visit: Payer: BC Managed Care – PPO

## 2013-09-11 ENCOUNTER — Ambulatory Visit
Admission: RE | Admit: 2013-09-11 | Discharge: 2013-09-11 | Disposition: A | Discharge status: Home / Self Care | Payer: BC Managed Care – PPO | Source: Ambulatory Visit | Attending: Gastroenterology | Admitting: Gastroenterology

## 2013-09-11 DIAGNOSIS — K7460 Unspecified cirrhosis of liver: Secondary | ICD-10-CM

## 2013-09-13 ENCOUNTER — Ambulatory Visit (HOSPITAL_BASED_OUTPATIENT_CLINIC_OR_DEPARTMENT_OTHER): Payer: BC Managed Care – PPO | Admitting: Hematology & Oncology

## 2013-09-13 ENCOUNTER — Encounter: Payer: Self-pay | Admitting: Hematology & Oncology

## 2013-09-13 ENCOUNTER — Other Ambulatory Visit (HOSPITAL_BASED_OUTPATIENT_CLINIC_OR_DEPARTMENT_OTHER): Payer: BC Managed Care – PPO | Admitting: Lab

## 2013-09-13 VITALS — BP 123/64 | HR 74 | Temp 98.2°F | Resp 18 | Ht 68.0 in | Wt 237.0 lb

## 2013-09-13 DIAGNOSIS — D693 Immune thrombocytopenic purpura: Secondary | ICD-10-CM

## 2013-09-13 DIAGNOSIS — D649 Anemia, unspecified: Secondary | ICD-10-CM

## 2013-09-13 DIAGNOSIS — K7460 Unspecified cirrhosis of liver: Secondary | ICD-10-CM

## 2013-09-13 DIAGNOSIS — D72819 Decreased white blood cell count, unspecified: Secondary | ICD-10-CM

## 2013-09-13 LAB — CBC WITH DIFFERENTIAL (CANCER CENTER ONLY)
BASO#: 0 10*3/uL (ref 0.0–0.2)
BASO%: 0.3 % (ref 0.0–2.0)
EOS ABS: 0.1 10*3/uL (ref 0.0–0.5)
EOS%: 3.3 % (ref 0.0–7.0)
HEMATOCRIT: 38.4 % — AB (ref 38.7–49.9)
HEMOGLOBIN: 13.4 g/dL (ref 13.0–17.1)
LYMPH#: 1 10*3/uL (ref 0.9–3.3)
LYMPH%: 33 % (ref 14.0–48.0)
MCH: 29.7 pg (ref 28.0–33.4)
MCHC: 34.9 g/dL (ref 32.0–35.9)
MCV: 85 fL (ref 82–98)
MONO#: 0.3 10*3/uL (ref 0.1–0.9)
MONO%: 10.8 % (ref 0.0–13.0)
NEUT#: 1.6 10*3/uL (ref 1.5–6.5)
NEUT%: 52.6 % (ref 40.0–80.0)
Platelets: 85 10*3/uL — ABNORMAL LOW (ref 145–400)
RBC: 4.51 10*6/uL (ref 4.20–5.70)
RDW: 14.6 % (ref 11.1–15.7)
WBC: 3.1 10*3/uL — AB (ref 4.0–10.0)

## 2013-09-13 LAB — CHCC SATELLITE - SMEAR

## 2013-09-13 NOTE — Progress Notes (Signed)
Hematology and Oncology Follow Up Visit  Oscar [name] [id number] [birth year] 48 y.o. 09/13/2013   Principle Diagnosis:   Chronic immune thrombocytopenia  Cirrhosis  Leukopenia  Current Therapy:    Observation     Interim History:  Mr.  [name] is back for followup. He now is have a colonoscopy done. He has polyps. A colonoscopy previously had a lot of bleeding. The gastroenterologist watchmaker that his reticulocyte count is adequate.  He's been no rectal bleeding. He's had no abdominal pain. He's had no cough. He got to his cervical spine surgery without any issues. This was in February. He had no bleeding. He's still having some problems with stiffness.  Medications: Current outpatient prescriptions:aspirin 325 MG tablet, Take 325 mg by mouth every other day. , Disp: , Rfl: ;  atenolol (TENORMIN) 50 MG tablet, Take 1 tablet (50 mg total) by mouth daily., Disp: 90 tablet, Rfl: 1;  docusate sodium (COLACE) 100 MG capsule, Take 100 mg by mouth daily., Disp: , Rfl: ;  esomeprazole (NEXIUM) 20 MG capsule, Take 20 mg by mouth daily before breakfast., Disp: , Rfl:  hydrochlorothiazide (HYDRODIURIL) 25 MG tablet, Take 1 tablet (25 mg total) by mouth daily., Disp: 90 tablet, Rfl: 1;  lisinopril (PRINIVIL,ZESTRIL) 20 MG tablet, Take 1 tablet (20 mg total) by mouth every morning., Disp: 90 tablet, Rfl: 1;  NOVOLIN N 100 UNIT/ML injection, Inject 10 Units into the skin at bedtime. , Disp: , Rfl:  NOVOLOG FLEXPEN 100 UNIT/ML SOPN FlexPen, Inject 5 Units into the skin 3 (three) times daily with meals. , Disp: , Rfl: ;  propranolol (INDERAL) 10 MG tablet, Take 10 mg by mouth 2 (two) times daily., Disp: , Rfl: ;  sitaGLIPtin-metformin (JANUMET) 50-1000 MG per tablet, Take 1 tablet by mouth 2 (two) times daily with a meal., Disp: 180 tablet, Rfl: 1;  traMADol (ULTRAM) 50 MG tablet, Take by mouth every 6 (six) hours as needed., Disp: , Rfl:   Allergies:  Allergies  Allergen Reactions  . Byetta  10 Mcg Pen [Exenatide] Nausea And Vomiting    Past Medical History, Surgical history, Social history, and Family History were reviewed and updated.  Review of Systems: As above  Physical Exam:  height is 5' 8"  (1.727 m) and weight is 237 lb (107.502 kg). His oral temperature is 98.2 F (36.8 C). His blood pressure is 123/64 and his pulse is 74. His respiration is 18.   Mildly obese white gentleman. Head and neck exam shows no ocular or oral lesions. Neck is supple with no adenopathy. Lungs are clear bilaterally. Cardiac exam regular rate rhythm. Abdomen soft. Mildly obese. No palpable liver or spleen tip. Back exam no tenderness over the spine. Extremities no clubbing cyanosis or edema. Skin exam no ecchymoses or petechia.  Lab Results  Component Value Date   WBC 3.1* 09/13/2013   HGB 13.4 09/13/2013   HCT 38.4* 09/13/2013   MCV 85 09/13/2013   PLT 85* 09/13/2013     Chemistry      Component Value Date/Time   NA 135* 05/23/2013 1443   NA 135 12/28/2012 0809   K 4.5 05/23/2013 1443   K 4.4 12/28/2012 0809   CL 95* 05/23/2013 1443   CL 98 12/28/2012 0809   CO2 26 05/23/2013 1443   CO2 26 12/28/2012 0809   BUN 10 05/23/2013 1443   BUN 8 12/28/2012 0809   CREATININE 0.81 05/23/2013 1443   CREATININE 1.50* 02/27/2013 1507      Component Value  Date/Time   CALCIUM 9.7 05/23/2013 1443   CALCIUM 9.1 12/28/2012 0809   ALKPHOS 164* 05/23/2013 1443   ALKPHOS 124* 12/28/2012 0809   AST 38* 05/23/2013 1443   AST 119* 12/28/2012 0809   ALT 32 05/23/2013 1443   ALT 62* 12/28/2012 0809   BILITOT 0.9 05/23/2013 1443   BILITOT 1.70* 12/28/2012 0809         Impression and Plan: Oscar [name] is 48 year old woman with chronic immune thrombocytopenia. He has cirrhosis. This is NASH related.  We gave him Nplate prior to his cervical spine surgery. This returned well for him. As such, we will do this again. Also, he will or need one dose.  Bowel get up with his gastroenterologist to try to according all this.  I  don't think we have to get back to see myself unless there are problems. We will have him come back for labs in the Nplate injection.  Oscar Napoleon, MD 5/21/20154:34 PM

## 2013-09-14 LAB — IRON AND TIBC CHCC
%SAT: 13 % — AB (ref 20–55)
Iron: 63 ug/dL (ref 42–163)
TIBC: 495 ug/dL — ABNORMAL HIGH (ref 202–409)
UIBC: 432 ug/dL — AB (ref 117–376)

## 2013-09-14 LAB — FERRITIN CHCC: Ferritin: 11 ng/ml — ABNORMAL LOW (ref 22–316)

## 2013-09-24 DIAGNOSIS — K922 Gastrointestinal hemorrhage, unspecified: Secondary | ICD-10-CM

## 2013-09-24 HISTORY — DX: Gastrointestinal hemorrhage, unspecified: K92.2

## 2013-10-01 ENCOUNTER — Encounter: Payer: Self-pay | Admitting: Internal Medicine

## 2013-10-01 ENCOUNTER — Other Ambulatory Visit: Payer: Self-pay

## 2013-10-01 MED ORDER — LISINOPRIL 20 MG PO TABS: 20.0000 mg | ORAL_TABLET | Freq: Every morning | ORAL | Status: DC

## 2013-10-18 ENCOUNTER — Encounter (HOSPITAL_COMMUNITY): Payer: Self-pay | Admitting: Emergency Medicine

## 2013-10-18 ENCOUNTER — Inpatient Hospital Stay (HOSPITAL_COMMUNITY)
Admission: EM | Admit: 2013-10-18 | Discharge: 2013-10-19 | DRG: 433 | Disposition: A | Discharge status: Home / Self Care | Payer: BC Managed Care – PPO | Attending: Internal Medicine | Admitting: Internal Medicine

## 2013-10-18 DIAGNOSIS — Z7982 Long term (current) use of aspirin: Secondary | ICD-10-CM

## 2013-10-18 DIAGNOSIS — K920 Hematemesis: Secondary | ICD-10-CM

## 2013-10-18 DIAGNOSIS — I8510 Secondary esophageal varices without bleeding: Secondary | ICD-10-CM | POA: Diagnosis present

## 2013-10-18 DIAGNOSIS — R110 Nausea: Secondary | ICD-10-CM

## 2013-10-18 DIAGNOSIS — K319 Disease of stomach and duodenum, unspecified: Secondary | ICD-10-CM | POA: Diagnosis present

## 2013-10-18 DIAGNOSIS — Z794 Long term (current) use of insulin: Secondary | ICD-10-CM

## 2013-10-18 DIAGNOSIS — I10 Essential (primary) hypertension: Secondary | ICD-10-CM | POA: Diagnosis present

## 2013-10-18 DIAGNOSIS — Z6837 Body mass index (BMI) 37.0-37.9, adult: Secondary | ICD-10-CM

## 2013-10-18 DIAGNOSIS — E669 Obesity, unspecified: Secondary | ICD-10-CM | POA: Diagnosis present

## 2013-10-18 DIAGNOSIS — E119 Type 2 diabetes mellitus without complications: Secondary | ICD-10-CM | POA: Diagnosis present

## 2013-10-18 DIAGNOSIS — M5020 Other cervical disc displacement, unspecified cervical region: Secondary | ICD-10-CM | POA: Diagnosis present

## 2013-10-18 DIAGNOSIS — K7030 Alcoholic cirrhosis of liver without ascites: Principal | ICD-10-CM | POA: Diagnosis present

## 2013-10-18 DIAGNOSIS — D693 Immune thrombocytopenic purpura: Secondary | ICD-10-CM | POA: Diagnosis present

## 2013-10-18 DIAGNOSIS — M129 Arthropathy, unspecified: Secondary | ICD-10-CM | POA: Diagnosis present

## 2013-10-18 DIAGNOSIS — G4730 Sleep apnea, unspecified: Secondary | ICD-10-CM | POA: Diagnosis present

## 2013-10-18 DIAGNOSIS — B159 Hepatitis A without hepatic coma: Secondary | ICD-10-CM | POA: Diagnosis present

## 2013-10-18 DIAGNOSIS — K219 Gastro-esophageal reflux disease without esophagitis: Secondary | ICD-10-CM | POA: Diagnosis present

## 2013-10-18 HISTORY — DX: Type 2 diabetes mellitus without complications: E11.9

## 2013-10-18 HISTORY — DX: Hematemesis: K92.0

## 2013-10-18 HISTORY — DX: Headache: R51

## 2013-10-18 HISTORY — DX: Unspecified osteoarthritis, unspecified site: M19.90

## 2013-10-18 LAB — CBC WITH DIFFERENTIAL/PLATELET
Basophils Absolute: 0 10*3/uL (ref 0.0–0.1)
Basophils Relative: 0 % (ref 0–1)
EOS ABS: 0.1 10*3/uL (ref 0.0–0.7)
Eosinophils Relative: 2 % (ref 0–5)
HCT: 39.8 % (ref 39.0–52.0)
HEMOGLOBIN: 13.6 g/dL (ref 13.0–17.0)
LYMPHS ABS: 1 10*3/uL (ref 0.7–4.0)
Lymphocytes Relative: 24 % (ref 12–46)
MCH: 29 pg (ref 26.0–34.0)
MCHC: 34.2 g/dL (ref 30.0–36.0)
MCV: 84.9 fL (ref 78.0–100.0)
Monocytes Absolute: 0.4 10*3/uL (ref 0.1–1.0)
Monocytes Relative: 11 % (ref 3–12)
NEUTROS ABS: 2.6 10*3/uL (ref 1.7–7.7)
NEUTROS PCT: 63 % (ref 43–77)
Platelets: 94 10*3/uL — ABNORMAL LOW (ref 150–400)
RBC: 4.69 MIL/uL (ref 4.22–5.81)
RDW: 14.5 % (ref 11.5–15.5)
WBC: 4.1 10*3/uL (ref 4.0–10.5)

## 2013-10-18 LAB — URINALYSIS, ROUTINE W REFLEX MICROSCOPIC
Bilirubin Urine: NEGATIVE
Glucose, UA: 500 mg/dL — AB
Hgb urine dipstick: NEGATIVE
KETONES UR: NEGATIVE mg/dL
LEUKOCYTES UA: NEGATIVE
Nitrite: NEGATIVE
PROTEIN: NEGATIVE mg/dL
Specific Gravity, Urine: 1.025 (ref 1.005–1.030)
UROBILINOGEN UA: 1 mg/dL (ref 0.0–1.0)
pH: 5 (ref 5.0–8.0)

## 2013-10-18 LAB — POC OCCULT BLOOD, ED: FECAL OCCULT BLD: NEGATIVE

## 2013-10-18 LAB — COMPREHENSIVE METABOLIC PANEL
ALK PHOS: 152 U/L — AB (ref 39–117)
ALT: 21 U/L (ref 0–53)
AST: 25 U/L (ref 0–37)
Albumin: 3.4 g/dL — ABNORMAL LOW (ref 3.5–5.2)
BILIRUBIN TOTAL: 1.2 mg/dL (ref 0.3–1.2)
BUN: 11 mg/dL (ref 6–23)
CO2: 23 mEq/L (ref 19–32)
Calcium: 9.6 mg/dL (ref 8.4–10.5)
Chloride: 101 mEq/L (ref 96–112)
Creatinine, Ser: 0.74 mg/dL (ref 0.50–1.35)
GFR calc Af Amer: >90 mL/min (ref >90)
GFR calc non Af Amer: >90 mL/min (ref >90)
GLUCOSE: 138 mg/dL — AB (ref 70–99)
POTASSIUM: 4 meq/L (ref 3.7–5.3)
SODIUM: 139 meq/L (ref 137–147)
Total Protein: 7.5 g/dL (ref 6.0–8.3)

## 2013-10-18 LAB — LIPASE, BLOOD: Lipase: 42 U/L (ref 11–59)

## 2013-10-18 LAB — HEMOGLOBIN AND HEMATOCRIT, BLOOD
HCT: 37.4 % — ABNORMAL LOW (ref 39.0–52.0)
HCT: 37.1 % — ABNORMAL LOW (ref 39.0–52.0)
HEMOGLOBIN: 12.5 g/dL — AB (ref 13.0–17.0)
Hemoglobin: 12.8 g/dL — ABNORMAL LOW (ref 13.0–17.0)

## 2013-10-18 LAB — TYPE AND SCREEN
ABO/RH(D): B POS
Antibody Screen: NEGATIVE

## 2013-10-18 LAB — GLUCOSE, CAPILLARY
Glucose-Capillary: 102 mg/dL — ABNORMAL HIGH (ref 70–99)
Glucose-Capillary: 190 mg/dL — ABNORMAL HIGH (ref 70–99)
Glucose-Capillary: 177 mg/dL — ABNORMAL HIGH (ref 70–99)

## 2013-10-18 LAB — ABO/RH: ABO/RH(D): B POS

## 2013-10-18 LAB — PROTIME-INR
INR: 1.32 (ref 0.00–1.49)
PROTHROMBIN TIME: 16.4 s — AB (ref 11.6–15.2)

## 2013-10-18 MED ORDER — PANTOPRAZOLE SODIUM 40 MG IV SOLR
40.0000 mg | Freq: Two times a day (BID) | INTRAVENOUS | Status: DC
  Administered 2013-10-18: 40 mg via INTRAVENOUS
  Filled 2013-10-18 (×4): qty 40

## 2013-10-18 MED ORDER — MORPHINE SULFATE 2 MG/ML IJ SOLN
0.5000 mg | INTRAMUSCULAR | Status: DC | PRN
Start: 2013-10-18 — End: 2013-10-19

## 2013-10-18 MED ORDER — DIPHENHYDRAMINE HCL 25 MG PO CAPS: 25.0000 mg | ORAL_CAPSULE | Freq: Four times a day (QID) | ORAL | Status: DC | PRN

## 2013-10-18 MED ORDER — PROPRANOLOL HCL 10 MG PO TABS
10.0000 mg | ORAL_TABLET | Freq: Two times a day (BID) | ORAL | Status: DC
  Administered 2013-10-18: 10 mg via ORAL
  Filled 2013-10-18 (×3): qty 1

## 2013-10-18 MED ORDER — SODIUM CHLORIDE 0.9 % IJ SOLN: 3.0000 mL | Freq: Two times a day (BID) | INTRAMUSCULAR | Status: DC

## 2013-10-18 MED ORDER — PANTOPRAZOLE SODIUM 40 MG IV SOLR
40.0000 mg | Freq: Once | INTRAVENOUS | Status: AC
  Administered 2013-10-18: 40 mg via INTRAVENOUS
  Filled 2013-10-18: qty 40

## 2013-10-18 MED ORDER — INSULIN ASPART 100 UNIT/ML ~~LOC~~ SOLN
0.0000 [IU] | Freq: Three times a day (TID) | SUBCUTANEOUS | Status: DC
  Administered 2013-10-18: 3 [IU] via SUBCUTANEOUS
  Administered 2013-10-19: 2 [IU] via SUBCUTANEOUS

## 2013-10-18 MED ORDER — ACETAMINOPHEN 325 MG PO TABS: 650.0000 mg | ORAL_TABLET | Freq: Four times a day (QID) | ORAL | Status: DC | PRN

## 2013-10-18 MED ORDER — ATENOLOL 50 MG PO TABS: 50.0000 mg | ORAL_TABLET | Freq: Every day | ORAL | Status: DC

## 2013-10-18 MED ORDER — ONDANSETRON HCL 4 MG/2ML IJ SOLN: 4.0000 mg | INTRAMUSCULAR | Status: DC | PRN

## 2013-10-18 MED ORDER — ZOLPIDEM TARTRATE 5 MG PO TABS
5.0000 mg | ORAL_TABLET | Freq: Once | ORAL | Status: AC
  Administered 2013-10-18: 5 mg via ORAL
  Filled 2013-10-18: qty 1

## 2013-10-18 MED ORDER — ACETAMINOPHEN 650 MG RE SUPP: 650.0000 mg | Freq: Four times a day (QID) | RECTAL | Status: DC | PRN

## 2013-10-18 MED ORDER — SODIUM CHLORIDE 0.9 % IV SOLN
INTRAVENOUS | Status: DC
  Administered 2013-10-18 (×2): via INTRAVENOUS

## 2013-10-18 NOTE — Discharge Summary (Deleted)
[facility] History and Physical  Oscar [name] [id number] DOB: 1965/05/21 DOA: 10/18/2013  Referring physician: EDP PCP: Elby Showers, MD   Chief Complaint: Hematemesis  HPI: Oscar [name] is a 48 y.o. male past medical history significant cirrhosis secondary to alcohol& steatohepatitis  hypertension, Esophageal/gastric varices who presents with above complaints. He states that he felt nauseous this a.m. and took some antacids, but following that he vomited of about half a cup full of bright red blood. He admits to some mild diffuse abdominal cramping. He denies diarrhea, melena and no hematochezia. Patient states that he quit alcohol last October. He denies NSAID or use (states even though aspirin is listed on his preadmission meds he has not been taking it). He was seen in the ED and hemoglobin was 13.6, platelet count 94 (from 85 in May of 2015) rectal done per EDP was guaiac negative. GI was consulted, he was typed and screened  and is admitted for further evaluation and management.  He denies chest pain, dizziness, shortness of breath.    Review of Systems The patient denies anorexia, fever, weight loss,, vision loss, decreased hearing, hoarseness, chest pain, syncope, dyspnea on exertion, peripheral edema, balance deficits, hemoptysis, abdominal pain, melena, hematochezia, severe indigestion/heartburn, hematuria, incontinence, genital sores, muscle weakness, suspicious skin lesions, transient blindness, difficulty walking, depression, unusual weight change   Past Medical History  Diagnosis Date  . Fatty liver   . Obesity   . ITP (idiopathic thrombocytopenic purpura) 05/16/2013  . History of colon polyps   . Hypertension     takes Atenolol,HCTZ,and Lisinopril daily  . Gastric varices     takes Propranlol for this  . Sleep apnea     sleep study in epic from 2006 but doesn't use a cpap  . Numbness     and tingling  . Arthritis     osteo  . Neck pain      HNP and radiculpathy  . Bruises easily   . GERD (gastroesophageal reflux disease)     takes Nexium daily  . GI bleed   . Esophageal varices   . Urinary frequency   . Pancytopenia     but never had a blood transfusion  . Hepatitis     hx of Hep A  . Diabetes mellitus     takes Novolin and Novolog and takes Janumet daily  . Constipation     related to pain meds and takes Colace daily  . Insomnia     but doesn't take any meds  . History of staph infection    Past Surgical History  Procedure Laterality Date  . Esophagogastroduodenoscopy (egd) with propofol N/A 02/07/2013    Procedure: ESOPHAGOGASTRODUODENOSCOPY (EGD) WITH PROPOFOL;  Surgeon: Arta Silence, MD;  Location: [facility];  Service: Endoscopy;  Laterality: N/A;  . Colonoscopy with propofol N/A 02/07/2013    Procedure: COLONOSCOPY WITH PROPOFOL;  Surgeon: Arta Silence, MD;  Location: [facility];  Service: Endoscopy;  Laterality: N/A;  . I&d of jaw  02/2013  . Wisdom teeth extracted    . Anterior cervical decomp/discectomy fusion N/A 05/30/2013    Procedure: Cervical five-six anterior cervical decompression with interbody prosthesis plating and bonegraft;  Surgeon: Ophelia Charter, MD;  Location: [facility];  Service: Neurosurgery;  Laterality: N/A;  Cervical five-six anterior cervical decompression with interbody prosthesis plating and bonegraft   Social History:  reports that he has never smoked. He has never used smokeless tobacco. He reports that he does not drink alcohol or  use illicit drugs.  Allergies  Allergen Reactions  . Byetta 10 Mcg Pen [Exenatide] Nausea And Vomiting    History reviewed. No pertinent family history.   Prior to Admission medications   Medication Sig Start Date End Date Taking? Authorizing Provider  aspirin 325 MG tablet Take 325 mg by mouth daily as needed for headache.    Yes Historical Provider, MD  atenolol (TENORMIN) 50 MG tablet Take 1 tablet (50 mg total) by mouth daily. 04/27/13   Yes Elby Showers, MD  diphenhydrAMINE (BENADRYL) 25 mg capsule Take 25 mg by mouth every 6 (six) hours as needed for itching.   Yes Historical Provider, MD  docusate sodium (COLACE) 100 MG capsule Take 100 mg by mouth daily as needed for mild constipation.    Yes Historical Provider, MD  esomeprazole (NEXIUM) 20 MG capsule Take 20 mg by mouth daily before breakfast.   Yes Historical Provider, MD  hydrochlorothiazide (HYDRODIURIL) 25 MG tablet Take 1 tablet (25 mg total) by mouth daily. 04/27/13  Yes Elby Showers, MD  lisinopril (PRINIVIL,ZESTRIL) 20 MG tablet Take 1 tablet (20 mg total) by mouth every morning. 10/01/13  Yes Elby Showers, MD  NOVOLIN N 100 UNIT/ML injection Inject 10 Units into the skin at bedtime.  10/31/12  Yes Historical Provider, MD  NOVOLOG FLEXPEN 100 UNIT/ML SOPN FlexPen Inject 5-10 Units into the skin See admin instructions. Sliding scale 10/12/12  Yes Historical Provider, MD  propranolol (INDERAL) 10 MG tablet Take 10 mg by mouth 2 (two) times daily.   Yes Historical Provider, MD  sitaGLIPtin-metformin (JANUMET) 50-1000 MG per tablet Take 1 tablet by mouth 2 (two) times daily with a meal. 07/19/13  Yes Elby Showers, MD   Physical Exam: Filed Vitals:   10/18/13 1345  BP: 112/70  Pulse: 70  Temp: 98 F (36.7 C)  Resp: 18    BP 112/70  Pulse 70  Temp(Src) 98 F (36.7 C) (Oral)  Resp 18  Ht 5\' 8"  (1.727 m)  Wt 109.8 kg (242 lb 1 oz)  BMI 36.81 kg/m2  SpO2 98% Constitutional: Vital signs reviewed.  Patient is a well-developed and well-nourished  in no acute distress and cooperative with exam. Alert and oriented x3.  Head: Normocephalic and atraumatic Mouth: no erythema or exudates, MMM Eyes: PERRL, EOMI, conjunctivae normal, No scleral icterus.  Neck: Supple, Trachea midline normal ROM, No JVD, mass, thyromegaly, or carotid bruit present.  Cardiovascular: RRR, S1 normal, S2 normal, no MRG, pulses symmetric and intact bilaterally Pulmonary/Chest: normal respiratory  effort, CTAB, no wheezes, rales, or rhonchi Abdominal: Soft. Non-tender, non-distended, bowel sounds are normal, no masses, or guarding present.  GU: no CVA tenderness  extremities: No cyanosis and no edema  Neurological: A&O x3, Strength is normal and symmetric bilaterally, cranial nerve II-XII are grossly intact, no focal motor deficit, sensory intact to light touch bilaterally.  Skin: Warm, dry and intact. No rash, cyanosis, or clubbing.  Psychiatric: Normal mood and affect. speech and behavior is normal. Judgment and thought content normal. Cognition and memory are normal.                Labs on Admission:  Basic Metabolic Panel:  Recent Labs Lab 10/18/13 0745  NA 139  K 4.0  CL 101  CO2 23  GLUCOSE 138*  BUN 11  CREATININE 0.74  CALCIUM 9.6   Liver Function Tests:  Recent Labs Lab 10/18/13 0745  AST 25  ALT 21  ALKPHOS 152*  BILITOT  1.2  PROT 7.5  ALBUMIN 3.4*    Recent Labs Lab 10/18/13 0745  LIPASE 42   No results found for this basename: AMMONIA,  in the last 168 hours CBC:  Recent Labs Lab 10/18/13 0745  WBC 4.1  NEUTROABS 2.6  HGB 13.6  HCT 39.8  MCV 84.9  PLT 94*   Cardiac Enzymes: No results found for this basename: CKTOTAL, CKMB, CKMBINDEX, TROPONINI,  in the last 168 hours  BNP (last 3 results) No results found for this basename: PROBNP,  in the last 8760 hours CBG:  Recent Labs Lab 10/18/13 1105  GLUCAP 102*    Radiological Exams on Admission: No results found.   Assessment/Plan Active Problems:   Hematemesis/UGI bleed -As discussed above in patient with cirrhosis and prior history of varices -Volume resuscitation with IV fluids, serial H&H's follow and transfuse as clinically appropriate -Place on PPI -GI consulted>> per Dr. Watt Climes EGD planned in a.m. unless patient develops recurrent bleeding History of cirrhosis with varices - he quit alcohol last October -Continue propranolol as BP tolerates    Hypertension -Holding lisinopril and atenolol for now secondary to #1 -Follow and resume when clinically appropriate   Cervical herniated disc -Status post surgery in the past.   GERD (gastroesophageal reflux disease) -Place on PPI   ITP (idiopathic thrombocytopenic purpura) Platelet count stable,  -follow- and recheck.    Code Status: Full Family Communication: Wife at bedside Disposition Plan: Admit to telemetry  Time spent:>30  Sheila Oats [facility] Pager [phone]

## 2013-10-18 NOTE — Consult Note (Signed)
Reason for Consult: Upper GI bleed Referring Physician: Hospital team  Oscar [name] is an 48 y.o. male.  HPI: Patient known to my partner Dr. Paulita Fujita with history of cirrhosis from probably a combination of steatohepatitis and alcohol which we discussed with he and his wife and his hospital computer chart and office computer chart were reviewed and he felt a little nauseated this morning and threw up about a cupful of bright red blood but he has not had any bowel movements and has not been seen any black bowel movements and rarely takes aspirin or nonsteroidals which we discussed and he quit drinking last year and he has no other complaints and is currently feeling fine  Past Medical History  Diagnosis Date  . Fatty liver   . Obesity   . ITP (idiopathic thrombocytopenic purpura) 05/16/2013  . History of colon polyps   . Hypertension     takes Atenolol,HCTZ,and Lisinopril daily  . Gastric varices     takes Propranlol for this  . Sleep apnea     sleep study in epic from 2006 but doesn't use a cpap  . Numbness     and tingling  . Arthritis     osteo  . Neck pain     HNP and radiculpathy  . Bruises easily   . GERD (gastroesophageal reflux disease)     takes Nexium daily  . GI bleed   . Esophageal varices   . Urinary frequency   . Pancytopenia     but never had a blood transfusion  . Hepatitis     hx of Hep A  . Diabetes mellitus     takes Novolin and Novolog and takes Janumet daily  . Constipation     related to pain meds and takes Colace daily  . Insomnia     but doesn't take any meds  . History of staph infection     Past Surgical History  Procedure Laterality Date  . Esophagogastroduodenoscopy (egd) with propofol N/A 02/07/2013    Procedure: ESOPHAGOGASTRODUODENOSCOPY (EGD) WITH PROPOFOL;  Surgeon: Arta Silence, MD;  Location: [facility];  Service: Endoscopy;  Laterality: N/A;  . Colonoscopy with propofol N/A 02/07/2013    Procedure: COLONOSCOPY WITH  PROPOFOL;  Surgeon: Arta Silence, MD;  Location: [facility];  Service: Endoscopy;  Laterality: N/A;  . I&d of jaw  02/2013  . Wisdom teeth extracted    . Anterior cervical decomp/discectomy fusion N/A 05/30/2013    Procedure: Cervical five-six anterior cervical decompression with interbody prosthesis plating and bonegraft;  Surgeon: Ophelia Charter, MD;  Location: [facility];  Service: Neurosurgery;  Laterality: N/A;  Cervical five-six anterior cervical decompression with interbody prosthesis plating and bonegraft    History reviewed. No pertinent family history.  Social History:  reports that he has never smoked. He has never used smokeless tobacco. He reports that he does not drink alcohol or use illicit drugs.  Allergies:  Allergies  Allergen Reactions  . Byetta 10 Mcg Pen [Exenatide] Nausea And Vomiting    Medications: I have reviewed the patient's current medications.  Results for orders placed during the hospital encounter of 10/18/13 (from the past 48 hour(s))  CBC WITH DIFFERENTIAL     Status: Abnormal   Collection Time    10/18/13  7:45 AM      Result Value Ref Range   WBC 4.1  4.0 - 10.5 K/uL   RBC 4.69  4.22 - 5.81 MIL/uL   Hemoglobin 13.6  13.0 -  17.0 g/dL   HCT 39.8  39.0 - 52.0 %   MCV 84.9  78.0 - 100.0 fL   MCH 29.0  26.0 - 34.0 pg   MCHC 34.2  30.0 - 36.0 g/dL   RDW 14.5  11.5 - 15.5 %   Platelets 94 (*) 150 - 400 K/uL   Comment: PLATELET COUNT CONFIRMED BY SMEAR   Neutrophils Relative % 63  43 - 77 %   Neutro Abs 2.6  1.7 - 7.7 K/uL   Lymphocytes Relative 24  12 - 46 %   Lymphs Abs 1.0  0.7 - 4.0 K/uL   Monocytes Relative 11  3 - 12 %   Monocytes Absolute 0.4  0.1 - 1.0 K/uL   Eosinophils Relative 2  0 - 5 %   Eosinophils Absolute 0.1  0.0 - 0.7 K/uL   Basophils Relative 0  0 - 1 %   Basophils Absolute 0.0  0.0 - 0.1 K/uL  COMPREHENSIVE METABOLIC PANEL     Status: Abnormal   Collection Time    10/18/13  7:45 AM      Result Value Ref Range    Sodium 139  137 - 147 mEq/L   Potassium 4.0  3.7 - 5.3 mEq/L   Chloride 101  96 - 112 mEq/L   CO2 23  19 - 32 mEq/L   Glucose, Bld 138 (*) 70 - 99 mg/dL   BUN 11  6 - 23 mg/dL   Creatinine, Ser 0.74  0.50 - 1.35 mg/dL   Calcium 9.6  8.4 - 10.5 mg/dL   Total Protein 7.5  6.0 - 8.3 g/dL   Albumin 3.4 (*) 3.5 - 5.2 g/dL   AST 25  0 - 37 U/L   ALT 21  0 - 53 U/L   Alkaline Phosphatase 152 (*) 39 - 117 U/L   Total Bilirubin 1.2  0.3 - 1.2 mg/dL   GFR calc non Af Amer >90  >90 mL/min   GFR calc Af Amer >90  >90 mL/min   Comment: (NOTE)     The eGFR has been calculated using the CKD EPI equation.     This calculation has not been validated in all clinical situations.     eGFR's persistently <90 mL/min signify possible Chronic Kidney     Disease.  LIPASE, BLOOD     Status: None   Collection Time    10/18/13  7:45 AM      Result Value Ref Range   Lipase 42  11 - 59 U/L  POC OCCULT BLOOD, ED     Status: None   Collection Time    10/18/13  7:57 AM      Result Value Ref Range   Fecal Occult Bld NEGATIVE  NEGATIVE  URINALYSIS, ROUTINE W REFLEX MICROSCOPIC     Status: Abnormal   Collection Time    10/18/13  8:30 AM      Result Value Ref Range   Color, Urine YELLOW  YELLOW   APPearance CLEAR  CLEAR   Specific Gravity, Urine 1.025  1.005 - 1.030   pH 5.0  5.0 - 8.0   Glucose, UA 500 (*) NEGATIVE mg/dL   Hgb urine dipstick NEGATIVE  NEGATIVE   Bilirubin Urine NEGATIVE  NEGATIVE   Ketones, ur NEGATIVE  NEGATIVE mg/dL   Protein, ur NEGATIVE  NEGATIVE mg/dL   Urobilinogen, UA 1.0  0.0 - 1.0 mg/dL   Nitrite NEGATIVE  NEGATIVE   Leukocytes, UA NEGATIVE  NEGATIVE  Comment: MICROSCOPIC NOT DONE ON URINES WITH NEGATIVE PROTEIN, BLOOD, LEUKOCYTES, NITRITE, OR GLUCOSE <1000 mg/dL.  PROTIME-INR     Status: Abnormal   Collection Time    10/18/13  8:30 AM      Result Value Ref Range   Prothrombin Time 16.4 (*) 11.6 - 15.2 seconds   INR 1.32  0.00 - 1.49  GLUCOSE, CAPILLARY     Status:  Abnormal   Collection Time    10/18/13 11:05 AM      Result Value Ref Range   Glucose-Capillary 102 (*) 70 - 99 mg/dL    No results found.  ROS negative except above and he believes his dad May have had liver disease as well Blood pressure 116/76, pulse 68, temperature 97.8 F (36.6 C), temperature source Oral, resp. rate 18, height '5\' 8"'  (1.727 m), weight 109.8 kg (242 lb 1 oz), SpO2 99.00%. Physical Exam vital signs stable afebrile no acute distress lungs clear regular rate and rhythm abdomen is soft nontender labs pertinent for normal CBC platelets chronically low BUN creatinine normal  Assessment/Plan: Hopefully self-limited upper GI bleeding in a patient with cirrhosis Plan: The risks benefits methods of endoscopy was discussed and based on anesthesia assistance will proceed tomorrow morning at 10 AM however would proceed sooner if signs of recurrent bleeding and in the meantime may have clear liquids and possibly he can go home tomorrow after the procedure if no significant findings  Or  signs of active bleeding MAGOD,MARC E 10/18/2013, 1:25 PM

## 2013-10-18 NOTE — ED Provider Notes (Signed)
CSN: [id number]     Arrival date & time 10/18/13  0706 History   First MD Initiated Contact with Patient 10/18/13 [phone]     Chief Complaint  Patient presents with  . Hematemesis     (Consider location/radiation/quality/duration/timing/severity/associated sxs/prior Treatment) HPI Comments: Patient is a 48 year old male with history of ITP, fatty liver, hypertension, esophageal varices, liver cirrhosis, diabetes, GERD, hepatitis a who presents today after an episode of hematemesis. He reports that he woke up and had some heartburn. He went to work and around 5:30 AM at an episode of bright red blood in his emesis. His last stool was approximately 30 minutes ago and was normal for him. He denies any melena or bright red blood per rectum. He had an endoscopy done in October by Dr. Paulita Fujita. He has stopped drinking as of October when he was told he has these varices. He take his beta blocker as prescribed. He denies drug use.   The history is provided by the patient. No language interpreter was used.    Past Medical History  Diagnosis Date  . Fatty liver   . Obesity   . ITP (idiopathic thrombocytopenic purpura) 05/16/2013  . History of colon polyps   . Hypertension     takes Atenolol,HCTZ,and Lisinopril daily  . Gastric varices     takes Propranlol for this  . Sleep apnea     sleep study in epic from 2006 but doesn't use a cpap  . Numbness     and tingling  . Arthritis     osteo  . Neck pain     HNP and radiculpathy  . Bruises easily   . GERD (gastroesophageal reflux disease)     takes Nexium daily  . GI bleed   . Esophageal varices   . Urinary frequency   . Pancytopenia     but never had a blood transfusion  . Hepatitis     hx of Hep A  . Diabetes mellitus     takes Novolin and Novolog and takes Janumet daily  . Constipation     related to pain meds and takes Colace daily  . Insomnia     but doesn't take any meds  . History of staph infection    Past Surgical History   Procedure Laterality Date  . Esophagogastroduodenoscopy (egd) with propofol N/A 02/07/2013    Procedure: ESOPHAGOGASTRODUODENOSCOPY (EGD) WITH PROPOFOL;  Surgeon: Arta Silence, MD;  Location: [facility];  Service: Endoscopy;  Laterality: N/A;  . Colonoscopy with propofol N/A 02/07/2013    Procedure: COLONOSCOPY WITH PROPOFOL;  Surgeon: Arta Silence, MD;  Location: [facility];  Service: Endoscopy;  Laterality: N/A;  . I&d of jaw  02/2013  . Wisdom teeth extracted    . Anterior cervical decomp/discectomy fusion N/A 05/30/2013    Procedure: Cervical five-six anterior cervical decompression with interbody prosthesis plating and bonegraft;  Surgeon: Ophelia Charter, MD;  Location: [facility];  Service: Neurosurgery;  Laterality: N/A;  Cervical five-six anterior cervical decompression with interbody prosthesis plating and bonegraft   History reviewed. No pertinent family history. History  Substance Use Topics  . Smoking status: Never Smoker   . Smokeless tobacco: Never Used     Comment: never used tobacco  . Alcohol Use: No     Comment: quit drinking compeletely in Oct 2014    Review of Systems  Constitutional: Negative for fever and chills.  Respiratory: Negative for shortness of breath.   Cardiovascular: Negative for chest pain.  Gastrointestinal:  Positive for nausea, vomiting and abdominal pain. Negative for diarrhea, constipation and abdominal distention.  All other systems reviewed and are negative.     Allergies  Byetta 10 mcg pen  Home Medications   Prior to Admission medications   Medication Sig Start Date End Date Taking? Authorizing Provider  aspirin 325 MG tablet Take 325 mg by mouth daily as needed for headache.    Yes Historical Provider, MD  atenolol (TENORMIN) 50 MG tablet Take 1 tablet (50 mg total) by mouth daily. 04/27/13  Yes Elby Showers, MD  diphenhydrAMINE (BENADRYL) 25 mg capsule Take 25 mg by mouth every 6 (six) hours as needed for itching.   Yes  Historical Provider, MD  docusate sodium (COLACE) 100 MG capsule Take 100 mg by mouth daily as needed for mild constipation.    Yes Historical Provider, MD  esomeprazole (NEXIUM) 20 MG capsule Take 20 mg by mouth daily before breakfast.   Yes Historical Provider, MD  hydrochlorothiazide (HYDRODIURIL) 25 MG tablet Take 1 tablet (25 mg total) by mouth daily. 04/27/13  Yes Elby Showers, MD  lisinopril (PRINIVIL,ZESTRIL) 20 MG tablet Take 1 tablet (20 mg total) by mouth every morning. 10/01/13  Yes Elby Showers, MD  NOVOLIN N 100 UNIT/ML injection Inject 10 Units into the skin at bedtime.  10/31/12  Yes Historical Provider, MD  NOVOLOG FLEXPEN 100 UNIT/ML SOPN FlexPen Inject 5-10 Units into the skin See admin instructions. Sliding scale 10/12/12  Yes Historical Provider, MD  propranolol (INDERAL) 10 MG tablet Take 10 mg by mouth 2 (two) times daily.   Yes Historical Provider, MD  sitaGLIPtin-metformin (JANUMET) 50-1000 MG per tablet Take 1 tablet by mouth 2 (two) times daily with a meal. 07/19/13  Yes Elby Showers, MD   BP 114/56  Pulse 67  Temp(Src) 98.3 F (36.8 C) (Oral)  Resp 16  Wt 245 lb (111.131 kg)  SpO2 99% Physical Exam  Nursing note and vitals reviewed. Constitutional: He is oriented to person, place, and time. He appears well-developed and well-nourished. No distress.  HENT:  Head: Normocephalic and atraumatic.  Right Ear: External ear normal.  Left Ear: External ear normal.  Nose: Nose normal.  Eyes: Conjunctivae are normal.  Neck: Normal range of motion. No tracheal deviation present.  Cardiovascular: Normal rate, regular rhythm and normal heart sounds.   Pulmonary/Chest: Effort normal and breath sounds normal. No stridor.  Abdominal: Soft. He exhibits no distension. There is no tenderness.  Genitourinary: Rectum normal. Guaiac negative stool.  Brown stool on finger  Musculoskeletal: Normal range of motion.  Neurological: He is alert and oriented to person, place, and time.   Skin: Skin is warm and dry. He is not diaphoretic.  Psychiatric: He has a normal mood and affect. His behavior is normal.    ED Course  Procedures (including critical care time) Labs Review Labs Reviewed  CBC WITH DIFFERENTIAL - Abnormal; Notable for the following:    Platelets 94 (*)    All other components within normal limits  COMPREHENSIVE METABOLIC PANEL - Abnormal; Notable for the following:    Glucose, Bld 138 (*)    Albumin 3.4 (*)    Alkaline Phosphatase 152 (*)    All other components within normal limits  URINALYSIS, ROUTINE W REFLEX MICROSCOPIC - Abnormal; Notable for the following:    Glucose, UA 500 (*)    All other components within normal limits  PROTIME-INR - Abnormal; Notable for the following:    Prothrombin Time 16.4 (*)  All other components within normal limits  GLUCOSE, CAPILLARY - Abnormal; Notable for the following:    Glucose-Capillary 102 (*)    All other components within normal limits  URINE CULTURE  LIPASE, BLOOD  HEMOGLOBIN AND HEMATOCRIT, BLOOD  HEMOGLOBIN AND HEMATOCRIT, BLOOD  HEMOGLOBIN AND HEMATOCRIT, BLOOD  POC OCCULT BLOOD, ED  TYPE AND SCREEN  ABO/RH    Imaging Review No results found.   EKG Interpretation None     Discussed case with Dr. Watt Climes who will scope patient today. Will keep NPO.   MDM   Final diagnoses:  Hematemesis with nausea   Patient presents to ED after episode of hematemesis. Hemoccult negative. Hx of varices. Patient will likely be scoped today. Dr. Watt Climes will see and evaluate patient.  Will need medicine admission. Patient is hemodynamically stable. Admission and consultation is appreciated. Dr. Christy Gentles evaluated patient and agrees with plan. Patient / Family / Caregiver informed of clinical course, understand medical decision-making process, and agree with plan.     Elwyn Lade, PA-C 10/18/13 1601

## 2013-10-18 NOTE — Progress Notes (Signed)
Utilization Review Completed.Dowell, Oscar [name]  

## 2013-10-18 NOTE — ED Provider Notes (Signed)
Medical screening examination/treatment/procedure(s) were conducted as a shared visit with non-physician practitioner(s) and myself.  I personally evaluated the patient during the encounter.   EKG Interpretation None        Sharyon Cable, MD 10/18/13 2000

## 2013-10-18 NOTE — H&P (Signed)
[facility] History and Physical  Oscar [name] [id number] DOB: [birth year] DOA: 10/18/2013  Referring physician: EDP PCP: Elby Showers, MD   Chief Complaint: Hematemesis  HPI: Oscar [name] is a 48 y.o. male past medical history significant cirrhosis secondary to alcohol& steatohepatitis  hypertension, Esophageal/gastric varices who presents with above complaints. He states that he felt nauseous this a.m. and took some antacids, but following that he vomited of about half a cup full of bright red blood. He admits to some mild diffuse abdominal cramping. He denies diarrhea, melena and no hematochezia. Patient states that he quit alcohol last October. He denies NSAID or use (states even though aspirin is listed on his preadmission meds he has not been taking it). He was seen in the ED and hemoglobin was 13.6, platelet count 94 (from 85 in May of 2015) rectal done per EDP was guaiac negative. GI was consulted, he was typed and screened  and is admitted for further evaluation and management.  He denies chest pain, dizziness, shortness of breath.    Review of Systems The patient denies anorexia, fever, weight loss,, vision loss, decreased hearing, hoarseness, chest pain, syncope, dyspnea on exertion, peripheral edema, balance deficits, hemoptysis, abdominal pain, melena, hematochezia, severe indigestion/heartburn, hematuria, incontinence, genital sores, muscle weakness, suspicious skin lesions, transient blindness, difficulty walking, depression, unusual weight change   Past Medical History  Diagnosis Date  . Fatty liver   . Obesity   . ITP (idiopathic thrombocytopenic purpura) 05/16/2013  . History of colon polyps   . Hypertension     takes Atenolol,HCTZ,and Lisinopril daily  . Gastric varices     takes Propranlol for this  . Sleep apnea     sleep study in epic from 2006 but doesn't use a cpap  . Numbness     and tingling  . Arthritis     osteo  . Neck pain      HNP and radiculpathy  . Bruises easily   . GERD (gastroesophageal reflux disease)     takes Nexium daily  . GI bleed   . Esophageal varices   . Urinary frequency   . Pancytopenia     but never had a blood transfusion  . Hepatitis     hx of Hep A  . Diabetes mellitus     takes Novolin and Novolog and takes Janumet daily  . Constipation     related to pain meds and takes Colace daily  . Insomnia     but doesn't take any meds  . History of staph infection    Past Surgical History  Procedure Laterality Date  . Esophagogastroduodenoscopy (egd) with propofol N/A 02/07/2013    Procedure: ESOPHAGOGASTRODUODENOSCOPY (EGD) WITH PROPOFOL;  Surgeon: Arta Silence, MD;  Location: [facility];  Service: Endoscopy;  Laterality: N/A;  . Colonoscopy with propofol N/A 02/07/2013    Procedure: COLONOSCOPY WITH PROPOFOL;  Surgeon: Arta Silence, MD;  Location: [facility];  Service: Endoscopy;  Laterality: N/A;  . I&d of jaw  02/2013  . Wisdom teeth extracted    . Anterior cervical decomp/discectomy fusion N/A 05/30/2013    Procedure: Cervical five-six anterior cervical decompression with interbody prosthesis plating and bonegraft;  Surgeon: Ophelia Charter, MD;  Location: [place] [facility];  Service: Neurosurgery;  Laterality: N/A;  Cervical five-six anterior cervical decompression with interbody prosthesis plating and bonegraft   Social History:  reports that he has never smoked. He has never used smokeless tobacco. He reports that he does not drink alcohol or  use illicit drugs.  Allergies  Allergen Reactions  . Byetta 10 Mcg Pen [Exenatide] Nausea And Vomiting    History reviewed. No pertinent family history.   Prior to Admission medications   Medication Sig Start Date End Date Taking? Authorizing Provider  aspirin 325 MG tablet Take 325 mg by mouth daily as needed for headache.    Yes Historical Provider, MD  atenolol (TENORMIN) 50 MG tablet Take 1 tablet (50 mg total) by mouth daily. 04/27/13   Yes Elby Showers, MD  diphenhydrAMINE (BENADRYL) 25 mg capsule Take 25 mg by mouth every 6 (six) hours as needed for itching.   Yes Historical Provider, MD  docusate sodium (COLACE) 100 MG capsule Take 100 mg by mouth daily as needed for mild constipation.    Yes Historical Provider, MD  esomeprazole (NEXIUM) 20 MG capsule Take 20 mg by mouth daily before breakfast.   Yes Historical Provider, MD  hydrochlorothiazide (HYDRODIURIL) 25 MG tablet Take 1 tablet (25 mg total) by mouth daily. 04/27/13  Yes Elby Showers, MD  lisinopril (PRINIVIL,ZESTRIL) 20 MG tablet Take 1 tablet (20 mg total) by mouth every morning. 10/01/13  Yes Elby Showers, MD  NOVOLIN N 100 UNIT/ML injection Inject 10 Units into the skin at bedtime.  10/31/12  Yes Historical Provider, MD  NOVOLOG FLEXPEN 100 UNIT/ML SOPN FlexPen Inject 5-10 Units into the skin See admin instructions. Sliding scale 10/12/12  Yes Historical Provider, MD  propranolol (INDERAL) 10 MG tablet Take 10 mg by mouth 2 (two) times daily.   Yes Historical Provider, MD  sitaGLIPtin-metformin (JANUMET) 50-1000 MG per tablet Take 1 tablet by mouth 2 (two) times daily with a meal. 07/19/13  Yes Elby Showers, MD   Physical Exam: Filed Vitals:   10/18/13 1345  BP: 112/70  Pulse: 70  Temp: 98 F (36.7 C)  Resp: 18    BP 112/70  Pulse 70  Temp(Src) 98 F (36.7 C) (Oral)  Resp 18  Ht 5\' 8"  (1.727 m)  Wt 109.8 kg (242 lb 1 oz)  BMI 36.81 kg/m2  SpO2 98% Constitutional: Vital signs reviewed.  Patient is a well-developed and well-nourished  in no acute distress and cooperative with exam. Alert and oriented x3.  Head: Normocephalic and atraumatic Mouth: no erythema or exudates, MMM Eyes: PERRL, EOMI, conjunctivae normal, No scleral icterus.  Neck: Supple, Trachea midline normal ROM, No JVD, mass, thyromegaly, or carotid bruit present.  Cardiovascular: RRR, S1 normal, S2 normal, no MRG, pulses symmetric and intact bilaterally Pulmonary/Chest: normal respiratory  effort, CTAB, no wheezes, rales, or rhonchi Abdominal: Soft. Non-tender, non-distended, bowel sounds are normal, no masses, or guarding present.  GU: no CVA tenderness  extremities: No cyanosis and no edema  Neurological: A&O x3, Strength is normal and symmetric bilaterally, cranial nerve II-XII are grossly intact, no focal motor deficit, sensory intact to light touch bilaterally.  Skin: Warm, dry and intact. No rash, cyanosis, or clubbing.  Psychiatric: Normal mood and affect. speech and behavior is normal. Judgment and thought content normal. Cognition and memory are normal.                Labs on Admission:  Basic Metabolic Panel:  Recent Labs Lab 10/18/13 0745  NA 139  K 4.0  CL 101  CO2 23  GLUCOSE 138*  BUN 11  CREATININE 0.74  CALCIUM 9.6   Liver Function Tests:  Recent Labs Lab 10/18/13 0745  AST 25  ALT 21  ALKPHOS 152*  BILITOT  1.2  PROT 7.5  ALBUMIN 3.4*    Recent Labs Lab 10/18/13 0745  LIPASE 42   No results found for this basename: AMMONIA,  in the last 168 hours CBC:  Recent Labs Lab 10/18/13 0745  WBC 4.1  NEUTROABS 2.6  HGB 13.6  HCT 39.8  MCV 84.9  PLT 94*   Cardiac Enzymes: No results found for this basename: CKTOTAL, CKMB, CKMBINDEX, TROPONINI,  in the last 168 hours  BNP (last 3 results) No results found for this basename: PROBNP,  in the last 8760 hours CBG:  Recent Labs Lab 10/18/13 1105  GLUCAP 102*    Radiological Exams on Admission: No results found.   Assessment/Plan Active Problems:   Hematemesis/UGI bleed -As discussed above in patient with cirrhosis and prior history of varices -Volume resuscitation with IV fluids, serial H&H's follow and transfuse as clinically appropriate -Place on PPI -GI consulted>> per Dr. Watt Climes EGD planned in a.m. unless patient develops recurrent bleeding History of cirrhosis with varices - he quit alcohol last October -Continue propranolol as BP tolerates    Hypertension -Holding lisinopril and atenolol for now secondary to #1 -Follow and resume when clinically appropriate   Cervical herniated disc -Status post surgery in the past.   GERD (gastroesophageal reflux disease) -Place on PPI   ITP (idiopathic thrombocytopenic purpura) Platelet count stable,  -follow- and recheck.    Code Status: Full Family Communication: Wife at bedside Disposition Plan: Admit to telemetry  Time spent:>30  Sheila Oats [facility] Pager [phone]

## 2013-10-18 NOTE — ED Notes (Signed)
Attempted to call report, RN busy at this time. Asked to call back in 10 minutes.

## 2013-10-18 NOTE — Anesthesia Preprocedure Evaluation (Addendum)
Anesthesia Evaluation  Patient identified by MRN, date of birth, ID band Patient awake    Reviewed: Allergy & Precautions, H&P , NPO status , Patient's Chart, lab work & pertinent test results, reviewed documented beta blocker date and time   History of Anesthesia Complications Negative for: history of anesthetic complications  Airway Mallampati: III TM Distance: >3 FB Neck ROM: Full    Dental  (+) Teeth Intact, Dental Advisory Given   Pulmonary sleep apnea ,          Cardiovascular hypertension, Pt. on medications and Pt. on home beta blockers     Neuro/Psych    GI/Hepatic GERD-  Medicated and Controlled,(+)     substance abuse  alcohol use, Hepatitis -, A  Endo/Other  diabetes, Poorly Controlled, Type 2, Insulin Dependent, Oral Hypoglycemic AgentsMorbid obesity  Renal/GU      Musculoskeletal   Abdominal   Peds  Hematology   Anesthesia Other Findings   Reproductive/Obstetrics negative OB ROS                        Anesthesia Physical Anesthesia Plan  ASA: III  Anesthesia Plan: MAC   Post-op Pain Management:    Induction: Intravenous  Airway Management Planned: Simple Face Mask  Additional Equipment:   Intra-op Plan:   Post-operative Plan:   Informed Consent: I have reviewed the patients History and Physical, chart, labs and discussed the procedure including the risks, benefits and alternatives for the proposed anesthesia with the patient or authorized representative who has indicated his/her understanding and acceptance.   Dental advisory given  Plan Discussed with: CRNA and Anesthesiologist  Anesthesia Plan Comments:         Anesthesia Quick Evaluation

## 2013-10-18 NOTE — ED Provider Notes (Signed)
Patient seen/examined in the Emergency Department in conjunction with Midlevel Provider [place] Patient reports hematemesis Exam : awake/alert, no distress, abd soft Plan: admit due to h/o eosphageal varices   Sharyon Cable, MD 10/18/13 [phone]

## 2013-10-18 NOTE — ED Notes (Addendum)
Per pt started vomiting blood this am. sts hx of same but not this amount. sts dark in color. sts that he had some abdominal cramping this am but denies currently. Throat sore.

## 2013-10-19 ENCOUNTER — Encounter (HOSPITAL_COMMUNITY)
Admission: EM | Disposition: A | Discharge status: Home / Self Care | Payer: Self-pay | Source: Home / Self Care | Attending: Internal Medicine

## 2013-10-19 ENCOUNTER — Encounter (HOSPITAL_COMMUNITY): Payer: BC Managed Care – PPO | Admitting: Anesthesiology

## 2013-10-19 ENCOUNTER — Inpatient Hospital Stay (HOSPITAL_COMMUNITY): Payer: BC Managed Care – PPO | Admitting: Anesthesiology

## 2013-10-19 ENCOUNTER — Encounter (HOSPITAL_COMMUNITY): Payer: Self-pay

## 2013-10-19 HISTORY — PX: ESOPHAGOGASTRODUODENOSCOPY: SHX5428

## 2013-10-19 LAB — URINE CULTURE
CULTURE: NO GROWTH
Colony Count: NO GROWTH

## 2013-10-19 LAB — CBC WITH DIFFERENTIAL/PLATELET
BASOS PCT: 0 % (ref 0–1)
Basophils Absolute: 0 10*3/uL (ref 0.0–0.1)
EOS ABS: 0.1 10*3/uL (ref 0.0–0.7)
EOS PCT: 3 % (ref 0–5)
HEMATOCRIT: 36.6 % — AB (ref 39.0–52.0)
HEMOGLOBIN: 12.5 g/dL — AB (ref 13.0–17.0)
Lymphocytes Relative: 35 % (ref 12–46)
Lymphs Abs: 1.1 10*3/uL (ref 0.7–4.0)
MCH: 29.2 pg (ref 26.0–34.0)
MCHC: 34.2 g/dL (ref 30.0–36.0)
MCV: 85.5 fL (ref 78.0–100.0)
MONO ABS: 0.4 10*3/uL (ref 0.1–1.0)
MONOS PCT: 11 % (ref 3–12)
Neutro Abs: 1.6 10*3/uL — ABNORMAL LOW (ref 1.7–7.7)
Neutrophils Relative %: 50 % (ref 43–77)
Platelets: 76 10*3/uL — ABNORMAL LOW (ref 150–400)
RBC: 4.28 MIL/uL (ref 4.22–5.81)
RDW: 14.7 % (ref 11.5–15.5)
WBC: 3.3 10*3/uL — ABNORMAL LOW (ref 4.0–10.5)

## 2013-10-19 LAB — BASIC METABOLIC PANEL
BUN: 9 mg/dL (ref 6–23)
CALCIUM: 8.7 mg/dL (ref 8.4–10.5)
CHLORIDE: 104 meq/L (ref 96–112)
CO2: 23 mEq/L (ref 19–32)
CREATININE: 0.76 mg/dL (ref 0.50–1.35)
GFR calc non Af Amer: >90 mL/min (ref >90)
Glucose, Bld: 147 mg/dL — ABNORMAL HIGH (ref 70–99)
Potassium: 4.3 mEq/L (ref 3.7–5.3)
Sodium: 138 mEq/L (ref 137–147)

## 2013-10-19 LAB — GLUCOSE, CAPILLARY
Glucose-Capillary: 177 mg/dL — ABNORMAL HIGH (ref 70–99)
Glucose-Capillary: 125 mg/dL — ABNORMAL HIGH (ref 70–99)

## 2013-10-19 SURGERY — EGD (ESOPHAGOGASTRODUODENOSCOPY)
Anesthesia: Monitor Anesthesia Care

## 2013-10-19 MED ORDER — LIDOCAINE HCL (CARDIAC) 20 MG/ML IV SOLN
INTRAVENOUS | Status: DC | PRN
  Administered 2013-10-19: 40 mg via INTRAVENOUS

## 2013-10-19 MED ORDER — BUTAMBEN-TETRACAINE-BENZOCAINE 2-2-14 % EX AERO
INHALATION_SPRAY | CUTANEOUS | Status: DC | PRN
  Administered 2013-10-19: 2 via TOPICAL

## 2013-10-19 MED ORDER — ESOMEPRAZOLE MAGNESIUM 40 MG PO CPDR: 20.0000 mg | DELAYED_RELEASE_CAPSULE | Freq: Two times a day (BID) | ORAL | Status: DC

## 2013-10-19 MED ORDER — SODIUM CHLORIDE 0.9 % IV SOLN
INTRAVENOUS | Status: DC
  Administered 2013-10-19: 09:00:00 via INTRAVENOUS

## 2013-10-19 MED ORDER — SODIUM CHLORIDE 0.9 % IV SOLN
INTRAVENOUS | Status: DC | PRN
  Administered 2013-10-19: 10:00:00 via INTRAVENOUS

## 2013-10-19 MED ORDER — PROPOFOL INFUSION 10 MG/ML OPTIME
INTRAVENOUS | Status: DC | PRN
  Administered 2013-10-19: 75 ug/kg/min via INTRAVENOUS

## 2013-10-19 MED ORDER — MIDAZOLAM HCL 5 MG/5ML IJ SOLN
INTRAMUSCULAR | Status: DC | PRN
  Administered 2013-10-19 (×2): 1 mg via INTRAVENOUS

## 2013-10-19 MED ORDER — ONDANSETRON HCL 4 MG/2ML IJ SOLN
INTRAMUSCULAR | Status: DC | PRN
  Administered 2013-10-19: 4 mg via INTRAVENOUS

## 2013-10-19 NOTE — Progress Notes (Signed)
Oscar [name] 10:27 AM  Subjective: Patient without any further bleeding no black stools and no new complaints  Objective: Vital signs stable afebrile exam please see pre-assessment evaluation labs reviewed hemoglobin and platelet count stable BUN okay  Assessment: Cirrhosis and self-limited upper GI bleeding  Plan: Okay to proceed with endoscopy with anesthesia assistance and probably able to go home afterwards if no complication or signs of active bleeding or worrisome lesions MAGOD,MARC E

## 2013-10-19 NOTE — Op Note (Signed)
[facility] [place], [id number]   ENDOSCOPY PROCEDURE REPORT  PATIENT: Sakai, Wolford.  MR#: [id number] BIRTHDATE: [birth year] , 48  yrs. old GENDER: Male  ENDOSCOPIST: Clarene Essex, MD REFERRED NI:OEVOJ Marin Olp, M.D.  Emeline General, M.D.  PROCEDURE DATE:  10/19/2013 PROCEDURE:   EGD, diagnostic ASA CLASS:   Class II INDICATIONS:Hematemesis.  in patient with cirrhosis and history of varices  MEDICATIONS: propofol (Diprivan) 200mg  IV, Versed 2 mg IV, and Zofran 4 mg IV  TOPICAL ANESTHETIC:use  DESCRIPTION OF PROCEDURE:   After the risks benefits and alternatives of the procedure were thoroughly explained, informed consent was obtained.  The Pentax Gastroscope [id number]  endoscope was introduced through the mouth and advanced to the second portion of the duodenum , limited by Without limitations.   The instrument was slowly withdrawn as the mucosa was fully examined.the findings are recorded below and the patient tolerated the procedure well there was no obvious immediate complication and no signs of bleeding or at risk lesions         FINDINGS:1. Tiny distal esophageal varices which flatten out with air insufflation 2 mild portal gastropathy 3. Questionable antral varices versus edematous 4. Otherwise within normal limits EGD without any signs of bleeding or at risk lesions  COMPLICATIONS: none  ENDOSCOPIC IMPRESSION:above   RECOMMENDATIONS:okay with me to go home and followup with my partner Dr. Paulita Fujita as needed or for his repeat colonoscopy for polyps and to possibly lower portal pressure more might consider increasing beta blockers a little and no aspirin or nonsteroidals and call us sooner when necessary   REPEAT EXAM: as needed   _______________________________ Clarene Essex, MD eSigned:  Clarene Essex, MD 10/19/2013 10:51 AM    [id number] [name], MD Burney Gauze, MD  PATIENT NAME:  Naresh, Althaus. MR#: [id number]

## 2013-10-19 NOTE — Anesthesia Procedure Notes (Signed)
Procedure Name: MAC Date/Time: 10/19/2013 10:35 AM Performed by: Jenne Campus Pre-anesthesia Checklist: Patient identified, Emergency Drugs available, Suction available, Timeout performed and Patient being monitored Patient Re-evaluated:Patient Re-evaluated prior to inductionOxygen Delivery Method: Nasal cannula

## 2013-10-19 NOTE — Transfer of Care (Signed)
Immediate Anesthesia Transfer of Care Note  Patient: Oscar [name]  Procedure(s) Performed: Procedure(s): ESOPHAGOGASTRODUODENOSCOPY (EGD) (N/A)  Patient Location: Endoscopy Unit  Anesthesia Type:MAC  Level of Consciousness: awake, alert , oriented and patient cooperative  Airway & Oxygen Therapy: Patient Spontanous Breathing and Patient connected to nasal cannula oxygen  Post-op Assessment: Report given to PACU RN and Post -op Vital signs reviewed and stable  Post vital signs: Reviewed  Complications: No apparent anesthesia complications

## 2013-10-19 NOTE — Discharge Instructions (Signed)
hold hydrochlorothiazide until seen at PCPs office  hold lisinopril if he has any further episodes of GI bleeding at home

## 2013-10-19 NOTE — Progress Notes (Signed)
Patient discharged home with wife. Patient was given discharge information , education, and information on prescriptions. Patient was discharged with belongings. Patient was stable upon discharge and declined wheelchair or assistance when walking out of the hospital.

## 2013-10-19 NOTE — Discharge Summary (Addendum)
Physician Discharge Summary  YESHAYA VATH MRN: [id number] DOB/AGE: [birth year] 48 y.o.  PCP: Elby Showers, MD   Admit date: 10/18/2013 Discharge date: 10/19/2013  Discharge Diagnoses:   G. I bleeding   Hypertension   ITP (idiopathic thrombocytopenic purpura)   Cervical herniated disc   Hematemesis   GERD (gastroesophageal reflux disease)   Esophageal varices   Followup recommendations Follow up with PCP in 1 week He will need a CBC and his PCP appointment     Medication List    STOP taking these medications       aspirin 325 MG tablet      TAKE these medications       atenolol 50 MG tablet  Commonly known as:  TENORMIN  Take 1 tablet (50 mg total) by mouth daily.     diphenhydrAMINE 25 mg capsule  Commonly known as:  BENADRYL  Take 25 mg by mouth every 6 (six) hours as needed for itching.     docusate sodium 100 MG capsule  Commonly known as:  COLACE  Take 100 mg by mouth daily as needed for mild constipation.     esomeprazole 40 MG capsule  Commonly known as:  NEXIUM  Take 1 capsule (40 mg total) by mouth 2 (two) times daily before a meal.     hydrochlorothiazide 25 MG tablet  Commonly known as:  HYDRODIURIL  Take 1 tablet (25 mg total) by mouth daily.     lisinopril 20 MG tablet  Commonly known as:  PRINIVIL,ZESTRIL  Take 1 tablet (20 mg total) by mouth every morning.     NOVOLIN N 100 UNIT/ML injection  Generic drug:  insulin NPH Human  Inject 10 Units into the skin at bedtime.     NOVOLOG FLEXPEN 100 UNIT/ML FlexPen  Generic drug:  insulin aspart  Inject 5-10 Units into the skin See admin instructions. Sliding scale     propranolol 10 MG tablet  Commonly known as:  INDERAL  Take 10 mg by mouth 2 (two) times daily.     sitaGLIPtin-metformin 50-1000 MG per tablet  Commonly known as:  JANUMET  Take 1 tablet by mouth 2 (two) times daily with a meal.        Discharge Condition: *Stable Disposition: 01-Home or Self  Care   Consults: Gastroenterology-Marc Drema Balzarine, MD   Significant Diagnostic Studies: No results found.    Microbiology: Recent Results (from the past 240 hour(s))  URINE CULTURE     Status: None   Collection Time    10/18/13  8:30 AM      Result Value Ref Range Status   Specimen Description URINE, CLEAN CATCH   Final   Special Requests NONE   Final   Culture  Setup Time     Final   Value: 10/18/2013 12:43     Performed at [facility]     Final   Value: NO GROWTH     Performed at [facility]   Culture     Final   Value: NO GROWTH     Performed at [facility]   Report Status 10/19/2013 FINAL   Final     Labs: Results for orders placed during the hospital encounter of 10/18/13 (from the past 48 hour(s))  CBC WITH DIFFERENTIAL     Status: Abnormal   Collection Time    10/18/13  7:45 AM      Result Value Ref Range   WBC  4.1  4.0 - 10.5 K/uL   RBC 4.69  4.22 - 5.81 MIL/uL   Hemoglobin 13.6  13.0 - 17.0 g/dL   HCT 39.8  39.0 - 52.0 %   MCV 84.9  78.0 - 100.0 fL   MCH 29.0  26.0 - 34.0 pg   MCHC 34.2  30.0 - 36.0 g/dL   RDW 14.5  11.5 - 15.5 %   Platelets 94 (*) 150 - 400 K/uL   Comment: PLATELET COUNT CONFIRMED BY SMEAR   Neutrophils Relative % 63  43 - 77 %   Neutro Abs 2.6  1.7 - 7.7 K/uL   Lymphocytes Relative 24  12 - 46 %   Lymphs Abs 1.0  0.7 - 4.0 K/uL   Monocytes Relative 11  3 - 12 %   Monocytes Absolute 0.4  0.1 - 1.0 K/uL   Eosinophils Relative 2  0 - 5 %   Eosinophils Absolute 0.1  0.0 - 0.7 K/uL   Basophils Relative 0  0 - 1 %   Basophils Absolute 0.0  0.0 - 0.1 K/uL  COMPREHENSIVE METABOLIC PANEL     Status: Abnormal   Collection Time    10/18/13  7:45 AM      Result Value Ref Range   Sodium 139  137 - 147 mEq/L   Potassium 4.0  3.7 - 5.3 mEq/L   Chloride 101  96 - 112 mEq/L   CO2 23  19 - 32 mEq/L   Glucose, Bld 138 (*) 70 - 99 mg/dL   BUN 11  6 - 23 mg/dL   Creatinine, Ser 0.74  0.50 - 1.35 mg/dL    Calcium 9.6  8.4 - 10.5 mg/dL   Total Protein 7.5  6.0 - 8.3 g/dL   Albumin 3.4 (*) 3.5 - 5.2 g/dL   AST 25  0 - 37 U/L   ALT 21  0 - 53 U/L   Alkaline Phosphatase 152 (*) 39 - 117 U/L   Total Bilirubin 1.2  0.3 - 1.2 mg/dL   GFR calc non Af Amer >90  >90 mL/min   GFR calc Af Amer >90  >90 mL/min   Comment: (NOTE)     The eGFR has been calculated using the CKD EPI equation.     This calculation has not been validated in all clinical situations.     eGFR's persistently <90 mL/min signify possible Chronic Kidney     Disease.  LIPASE, BLOOD     Status: None   Collection Time    10/18/13  7:45 AM      Result Value Ref Range   Lipase 42  11 - 59 U/L  POC OCCULT BLOOD, ED     Status: None   Collection Time    10/18/13  7:57 AM      Result Value Ref Range   Fecal Occult Bld NEGATIVE  NEGATIVE  URINE CULTURE     Status: None   Collection Time    10/18/13  8:30 AM      Result Value Ref Range   Specimen Description URINE, CLEAN CATCH     Special Requests NONE     Culture  Setup Time       Value: 10/18/2013 12:43     Performed at [facility]       Value: NO GROWTH     Performed at [facility]   Culture       Value: NO GROWTH  Performed at [facility]   Report Status 10/19/2013 FINAL    URINALYSIS, ROUTINE W REFLEX MICROSCOPIC     Status: Abnormal   Collection Time    10/18/13  8:30 AM      Result Value Ref Range   Color, Urine YELLOW  YELLOW   APPearance CLEAR  CLEAR   Specific Gravity, Urine 1.025  1.005 - 1.030   pH 5.0  5.0 - 8.0   Glucose, UA 500 (*) NEGATIVE mg/dL   Hgb urine dipstick NEGATIVE  NEGATIVE   Bilirubin Urine NEGATIVE  NEGATIVE   Ketones, ur NEGATIVE  NEGATIVE mg/dL   Protein, ur NEGATIVE  NEGATIVE mg/dL   Urobilinogen, UA 1.0  0.0 - 1.0 mg/dL   Nitrite NEGATIVE  NEGATIVE   Leukocytes, UA NEGATIVE  NEGATIVE   Comment: MICROSCOPIC NOT DONE ON URINES WITH NEGATIVE PROTEIN, BLOOD, LEUKOCYTES, NITRITE, OR GLUCOSE  <1000 mg/dL.  TYPE AND SCREEN     Status: None   Collection Time    10/18/13  8:30 AM      Result Value Ref Range   ABO/RH(D) B POS     Antibody Screen NEG     Sample Expiration 10/21/2013    PROTIME-INR     Status: Abnormal   Collection Time    10/18/13  8:30 AM      Result Value Ref Range   Prothrombin Time 16.4 (*) 11.6 - 15.2 seconds   INR 1.32  0.00 - 1.49  ABO/RH     Status: None   Collection Time    10/18/13  8:30 AM      Result Value Ref Range   ABO/RH(D) B POS    GLUCOSE, CAPILLARY     Status: Abnormal   Collection Time    10/18/13 11:05 AM      Result Value Ref Range   Glucose-Capillary 102 (*) 70 - 99 mg/dL  GLUCOSE, CAPILLARY     Status: Abnormal   Collection Time    10/18/13  4:30 PM      Result Value Ref Range   Glucose-Capillary 190 (*) 70 - 99 mg/dL  HEMOGLOBIN AND HEMATOCRIT, BLOOD     Status: Abnormal   Collection Time    10/18/13  4:42 PM      Result Value Ref Range   Hemoglobin 12.5 (*) 13.0 - 17.0 g/dL   HCT 37.4 (*) 39.0 - 52.0 %  HEMOGLOBIN AND HEMATOCRIT, BLOOD     Status: Abnormal   Collection Time    10/18/13  8:05 PM      Result Value Ref Range   Hemoglobin 12.8 (*) 13.0 - 17.0 g/dL   HCT 37.1 (*) 39.0 - 52.0 %  GLUCOSE, CAPILLARY     Status: Abnormal   Collection Time    10/18/13  8:50 PM      Result Value Ref Range   Glucose-Capillary 177 (*) 70 - 99 mg/dL  CBC WITH DIFFERENTIAL     Status: Abnormal   Collection Time    10/19/13  2:50 AM      Result Value Ref Range   WBC 3.3 (*) 4.0 - 10.5 K/uL   RBC 4.28  4.22 - 5.81 MIL/uL   Hemoglobin 12.5 (*) 13.0 - 17.0 g/dL   HCT 36.6 (*) 39.0 - 52.0 %   MCV 85.5  78.0 - 100.0 fL   MCH 29.2  26.0 - 34.0 pg   MCHC 34.2  30.0 - 36.0 g/dL   RDW 14.7  11.5 -  15.5 %   Platelets 76 (*) 150 - 400 K/uL   Comment: CONSISTENT WITH PREVIOUS RESULT   Neutrophils Relative % 50  43 - 77 %   Neutro Abs 1.6 (*) 1.7 - 7.7 K/uL   Lymphocytes Relative 35  12 - 46 %   Lymphs Abs 1.1  0.7 - 4.0 K/uL    Monocytes Relative 11  3 - 12 %   Monocytes Absolute 0.4  0.1 - 1.0 K/uL   Eosinophils Relative 3  0 - 5 %   Eosinophils Absolute 0.1  0.0 - 0.7 K/uL   Basophils Relative 0  0 - 1 %   Basophils Absolute 0.0  0.0 - 0.1 K/uL  BASIC METABOLIC PANEL     Status: Abnormal   Collection Time    10/19/13  2:50 AM      Result Value Ref Range   Sodium 138  137 - 147 mEq/L   Potassium 4.3  3.7 - 5.3 mEq/L   Chloride 104  96 - 112 mEq/L   CO2 23  19 - 32 mEq/L   Glucose, Bld 147 (*) 70 - 99 mg/dL   BUN 9  6 - 23 mg/dL   Creatinine, Ser 0.76  0.50 - 1.35 mg/dL   Calcium 8.7  8.4 - 10.5 mg/dL   GFR calc non Af Amer >90  >90 mL/min   GFR calc Af Amer >90  >90 mL/min   Comment: (NOTE)     The eGFR has been calculated using the CKD EPI equation.     This calculation has not been validated in all clinical situations.     eGFR's persistently <90 mL/min signify possible Chronic Kidney     Disease.  GLUCOSE, CAPILLARY     Status: Abnormal   Collection Time    10/19/13  7:38 AM      Result Value Ref Range   Glucose-Capillary 177 (*) 70 - 99 mg/dL     HPI : 48 y.o. male past medical history significant cirrhosis secondary to alcohol& steatohepatitis hypertension, Esophageal/gastric varices who presents with above complaints. He states that he felt nauseous this a.m. and took some antacids, but following that he vomited of about half a cup full of bright red blood. He admits to some mild diffuse abdominal cramping. He denies diarrhea, melena and no hematochezia. Patient states that he quit alcohol last October. He denies NSAID or use (states even though aspirin is listed on his preadmission meds he has not been taking it). He was seen in the ED and hemoglobin was 13.6, platelet count 94 (from 85 in May of 2015) rectal done per EDP was guaiac negative. GI was consulted, he was typed and screened and is admitted for further evaluation and management.  He denies chest pain, dizziness, shortness of  breath.    HOSPITAL COURSE:  Upper GI bleeding Hemoglobin remained stable during this hospitalization it was 13.6 upon admission 12.5 upon discharge, he remained hemodynamically stable Seen by gastroenterology Dr. Watt Climes Patient underwent endoscopy which showed FINDINGS:1. Tiny distal esophageal varices which flatten out with  air insufflation 2 mild portal gastropathy 3. Questionable antral  varices versus edematous 4. Otherwise within normal limits EGD  without any signs of bleeding or at risk lesions  Followup with Dr. Paulita Fujita as needed or for his repeat colonoscopy for polyps Patient may need beta blocker to be increased to decrease portal pressure no aspirin or nonsteroidals e has been started on twice a day PPI Will need repeat CBC  in one week followup   Thrombocytopenia-history of ITP platelet count stable ,Repeat CBC in one week  Hypertension Continue lisinopril, atenolol Patient instructed to hold hydrochlorothiazide until seen at PCPs office He has been instructed to hold lisinopril if he has any further episodes of GI bleeding at home    Diabetes mellitus Continue gentle meds, Novolin and NovoLog Last hemoglobin A1c 2/14 was 11.2    Discharge Exam:   Blood pressure 119/64, pulse 71, temperature 98.2 F (36.8 C), temperature source Oral, resp. rate 16, height '5\' 8"'  (1.727 m), weight 110.678 kg (244 lb), SpO2 100.00%.  Cardiovascular: RRR, S1 normal, S2 normal, no MRG, pulses symmetric and intact bilaterally  Pulmonary/Chest: normal respiratory effort, CTAB, no wheezes, rales, or rhonchi  Abdominal: Soft. Non-tender, non-distended, bowel sounds are normal, no masses, or guarding present.  GU: no CVA tenderness extremities: No cyanosis and no edema  Neurological: A&O x3, Strength is normal and symmetric bilaterally, cranial nerve II-XII are grossly intact, no focal motor deficit, sensory intact to light touch bilaterally.  Skin: Warm, dry and intact. No rash,  cyanosis, or clubbing.  Psychiatric: Normal mood and affect. speech and behavior is normal. Judgment and thought content normal. Cognition and memory are normal          Follow-up Information   Follow up with Elby Showers, MD In 1 week.   Specialty:  Internal Medicine   Contact information:   [address] [place] [address] [phone]       Signed: Reyne Dumas 10/19/2013, 9:27 AM

## 2013-10-19 NOTE — Anesthesia Postprocedure Evaluation (Signed)
  Anesthesia Post-op Note  Patient: Oscar [name]  Procedure(s) Performed: Procedure(s): ESOPHAGOGASTRODUODENOSCOPY (EGD) (N/A)  Patient Location: PACU  Anesthesia Type:MAC  Level of Consciousness: awake  Airway and Oxygen Therapy: Patient Spontanous Breathing  Post-op Pain: mild  Post-op Assessment: Post-op Vital signs reviewed  Post-op Vital Signs: Reviewed  Last Vitals:  Filed Vitals:   10/19/13 1055  BP: 131/71  Pulse: 79  Temp:   Resp: 23    Complications: No apparent anesthesia complications

## 2013-10-20 MED ORDER — TRAMADOL HCL 50 MG PO TABS: 100.0000 mg | ORAL_TABLET | Freq: Two times a day (BID) | ORAL | Status: DC | PRN

## 2013-10-22 ENCOUNTER — Encounter (HOSPITAL_COMMUNITY): Payer: Self-pay | Admitting: Gastroenterology

## 2013-11-01 ENCOUNTER — Encounter: Payer: Self-pay | Admitting: Internal Medicine

## 2013-11-01 NOTE — Progress Notes (Signed)
   Subjective:    Patient ID: Oscar [name], male    DOB: August 03, 1965, 48 y.o.   MRN: [id number]  HPI Erroneous encounter     Review of Systems     Objective:   Physical Exam        Assessment & Plan:

## 2013-12-17 ENCOUNTER — Encounter: Payer: Self-pay | Admitting: *Deleted

## 2013-12-17 ENCOUNTER — Telehealth: Payer: Self-pay | Admitting: Hematology & Oncology

## 2013-12-17 ENCOUNTER — Telehealth: Payer: Self-pay | Admitting: *Deleted

## 2013-12-17 NOTE — Telephone Encounter (Signed)
Wife called to schedule injections, I transferred to RN voice mail to triage due to last office note

## 2013-12-17 NOTE — Telephone Encounter (Signed)
Received call from patient wife.  Patient platelets were 99,000 in other doctor office and patient going to be getting colonoscopy and endoscopy in Oct.  Asking whether or not patient should get Nplate prior to test.  Also patient iron saturation is low and feels he may need to get iron.  Dr. Marin Olp wants patient to see Clarise Cruz this week in office

## 2013-12-17 NOTE — Telephone Encounter (Signed)
Pt made 8-28 appointment he had to have afternoon

## 2013-12-20 ENCOUNTER — Other Ambulatory Visit: Payer: Self-pay

## 2013-12-20 DIAGNOSIS — D693 Immune thrombocytopenic purpura: Secondary | ICD-10-CM

## 2013-12-21 ENCOUNTER — Encounter: Payer: Self-pay | Admitting: Family

## 2013-12-21 ENCOUNTER — Other Ambulatory Visit (HOSPITAL_BASED_OUTPATIENT_CLINIC_OR_DEPARTMENT_OTHER): Payer: BC Managed Care – PPO | Admitting: Lab

## 2013-12-21 ENCOUNTER — Ambulatory Visit (HOSPITAL_BASED_OUTPATIENT_CLINIC_OR_DEPARTMENT_OTHER): Payer: BC Managed Care – PPO | Admitting: Family

## 2013-12-21 VITALS — BP 103/57 | HR 67 | Temp 98.3°F | Resp 20 | Ht 68.0 in | Wt 259.0 lb

## 2013-12-21 DIAGNOSIS — D693 Immune thrombocytopenic purpura: Secondary | ICD-10-CM

## 2013-12-21 DIAGNOSIS — K7689 Other specified diseases of liver: Secondary | ICD-10-CM

## 2013-12-21 LAB — CBC WITH DIFFERENTIAL (CANCER CENTER ONLY)
BASO#: 0 10*3/uL (ref 0.0–0.2)
BASO%: 0.3 % (ref 0.0–2.0)
EOS%: 2 % (ref 0.0–7.0)
Eosinophils Absolute: 0.1 10*3/uL (ref 0.0–0.5)
HEMATOCRIT: 36.5 % — AB (ref 38.7–49.9)
HGB: 12.9 g/dL — ABNORMAL LOW (ref 13.0–17.1)
LYMPH#: 1 10*3/uL (ref 0.9–3.3)
LYMPH%: 34.8 % (ref 14.0–48.0)
MCH: 29.7 pg (ref 28.0–33.4)
MCHC: 35.3 g/dL (ref 32.0–35.9)
MCV: 84 fL (ref 82–98)
MONO#: 0.2 10*3/uL (ref 0.1–0.9)
MONO%: 7.5 % (ref 0.0–13.0)
NEUT%: 55.4 % (ref 40.0–80.0)
NEUTROS ABS: 1.6 10*3/uL (ref 1.5–6.5)
Platelets: 83 10*3/uL — ABNORMAL LOW (ref 145–400)
RBC: 4.35 10*6/uL (ref 4.20–5.70)
RDW: 13.5 % (ref 11.1–15.7)
WBC: 2.9 10*3/uL — AB (ref 4.0–10.0)

## 2013-12-21 LAB — COMPREHENSIVE METABOLIC PANEL
ALBUMIN: 3.3 g/dL — AB (ref 3.5–5.2)
ALT: 21 U/L (ref 0–53)
AST: 23 U/L (ref 0–37)
Alkaline Phosphatase: 144 U/L — ABNORMAL HIGH (ref 39–117)
BUN: 13 mg/dL (ref 6–23)
CALCIUM: 8.8 mg/dL (ref 8.4–10.5)
CHLORIDE: 103 meq/L (ref 96–112)
CO2: 23 mEq/L (ref 19–32)
Creatinine, Ser: 0.69 mg/dL (ref 0.50–1.35)
Glucose, Bld: 308 mg/dL — ABNORMAL HIGH (ref 70–99)
POTASSIUM: 3.8 meq/L (ref 3.5–5.3)
SODIUM: 133 meq/L — AB (ref 135–145)
TOTAL PROTEIN: 6.5 g/dL (ref 6.0–8.3)
Total Bilirubin: 1 mg/dL (ref 0.2–1.2)

## 2013-12-21 LAB — CHCC SATELLITE - SMEAR

## 2013-12-21 NOTE — Progress Notes (Signed)
[place]  [phone] [phone]  ID: Oscar [name] OB: Sep 29, 1965 MR#: [id number] FGH#:[id number] Patient Care Team: Elby Showers, MD as PCP - General (Internal Medicine)  DIAGNOSIS: Chronic immune thrombocytopenia  Cirrhosis  Leukopenia  INTERVAL HISTORY: Oscar [name] is back for followup. He had a colonoscopy last year where some polys were removed but they were unable to finish because he had a lot of bleeding. He was hospitalized in June for several days with rectal bleeding. This stopped soon after. He is scheduled for endoscopy and colonoscopy in October. They hope to remove the rest of the polyps at that time and send them off for pathology. He denies pain or bleeding. He has had no swelling, tenderness, numbness or tingling in his extremities. He denies fever, chills, n/v, cough, rash, headache, dizziness, SOB, chest pain, palpitations, abdominal pain, constipation, diarrhea, blood in urine or stool . He denies swelling, tenderness, numbness or tingling in his extremities. His appetite is good and her is drinking plenty of fluids. He had his cervical spine surgery in February and had no issues with bleeding. He's still having some problems with stiffness. Overall, he is doing much batter  CURRENT TREATMENT: Observation  REVIEW OF SYSTEMS: All other 10 point review of systems is negative.   PAST MEDICAL HISTORY: Past Medical History  Diagnosis Date  . Fatty liver   . Obesity   . ITP (idiopathic thrombocytopenic purpura) 05/16/2013  . History of colon polyps   . Hypertension     takes Atenolol,HCTZ,and Lisinopril daily  . Gastric varices     takes Propranlol for this  . Numbness     and tingling  . Neck pain     HNP and radiculpathy  . Bruises easily   . GERD (gastroesophageal reflux disease)     takes Nexium daily  . GI bleed   . Esophageal varices   . Urinary frequency   . Pancytopenia     but never had a blood transfusion   . Hepatitis     hx of Hep A  . Constipation     related to pain meds and takes Colace daily  . Insomnia     but doesn't take any meds  . History of staph infection   . Sleep apnea     sleep study in epic from 2006 but doesn't use a cpap (10/18/2013)  . Type II diabetes mellitus     takes Novolin and Novolog and takes Janumet daily  . CVELFYBO(175.1)     "weekly" (10/18/2013)  . Osteoarthritis     "hands; elbows; knees" (10/18/2013)  . Hematemesis 10/18/2013    hospitalized   PAST SURGICAL HISTORY: Past Surgical History  Procedure Laterality Date  . Esophagogastroduodenoscopy (egd) with propofol N/A 02/07/2013    Procedure: ESOPHAGOGASTRODUODENOSCOPY (EGD) WITH PROPOFOL;  Surgeon: Arta Silence, MD;  Location: [facility];  Service: Endoscopy;  Laterality: N/A;  . Colonoscopy with propofol N/A 02/07/2013    Procedure: COLONOSCOPY WITH PROPOFOL;  Surgeon: Arta Silence, MD;  Location: [facility];  Service: Endoscopy;  Laterality: N/A;  . Incision and drainage mouth  02/2013    " jaw area"  . Wisdom tooth extraction  ~  1997  . Anterior cervical decomp/discectomy fusion N/A 05/30/2013    Procedure: Cervical five-six anterior cervical decompression with interbody prosthesis plating and bonegraft;  Surgeon: Ophelia Charter, MD;  Location: [facility];  Service: Neurosurgery;  Laterality: N/A;  Cervical five-six anterior cervical decompression with interbody prosthesis  plating and bonegraft  . Esophagogastroduodenoscopy N/A 10/19/2013    Procedure: ESOPHAGOGASTRODUODENOSCOPY (EGD);  Surgeon: Jeryl Columbia, MD;  Location: [facility];  Service: Endoscopy;  Laterality: N/A;   FAMILY HISTORY No family history on file.  GYNECOLOGIC HISTORY:  No LMP for male patient.   SOCIAL HISTORY:  History   Social History  . Marital Status: Married    Spouse Name: N/A    Number of Children: N/A  . Years of Education: N/A   Occupational History  . Not on file.   Social History Main  Topics  . Smoking status: Never Smoker   . Smokeless tobacco: Never Used     Comment: never used tobacco  . Alcohol Use: 0.0 oz/week     Comment: quit drinking compeletely in Oct 2014  . Drug Use: No  . Sexual Activity: Not Currently   Other Topics Concern  . Not on file   Social History Narrative  . No narrative on file   ADVANCED DIRECTIVES: <no information>  HEALTH MAINTENANCE: History  Substance Use Topics  . Smoking status: Never Smoker   . Smokeless tobacco: Never Used     Comment: never used tobacco  . Alcohol Use: 0.0 oz/week     Comment: quit drinking compeletely in Oct 2014   Colonoscopy: PAP: Bone density: Lipid panel:  Allergies  Allergen Reactions  . Byetta 10 Mcg Pen [Exenatide] Nausea And Vomiting    Current Outpatient Prescriptions  Medication Sig Dispense Refill  . atenolol (TENORMIN) 50 MG tablet Take 1 tablet (50 mg total) by mouth daily.  90 tablet  1  . diphenhydrAMINE (BENADRYL) 25 mg capsule Take 25 mg by mouth every 6 (six) hours as needed for itching.      . docusate sodium (COLACE) 100 MG capsule Take 100 mg by mouth daily as needed for mild constipation.       Marland Kitchen esomeprazole (NEXIUM) 40 MG capsule Take 1 capsule (40 mg total) by mouth 2 (two) times daily before a meal.  60 capsule  2  . hydrochlorothiazide (HYDRODIURIL) 25 MG tablet Take 1 tablet (25 mg total) by mouth daily.  90 tablet  1  . lisinopril (PRINIVIL,ZESTRIL) 20 MG tablet Take 1 tablet (20 mg total) by mouth every morning.  90 tablet  0  . NOVOLIN N 100 UNIT/ML injection Inject 10 Units into the skin at bedtime.       Marland Kitchen NOVOLOG FLEXPEN 100 UNIT/ML SOPN FlexPen Inject 5-10 Units into the skin See admin instructions. Sliding scale      . propranolol (INDERAL) 10 MG tablet Take 10 mg by mouth 2 (two) times daily.      . sitaGLIPtin-metformin (JANUMET) 50-1000 MG per tablet Take 1 tablet by mouth 2 (two) times daily with a meal.  180 tablet  1  . traMADol (ULTRAM) 50 MG tablet Take 2  tablets (100 mg total) by mouth every 12 (twelve) hours as needed for severe pain.  60 tablet  0   No current facility-administered medications for this visit.   OBJECTIVE: Filed Vitals:   12/21/13 1526  BP: 103/57  Pulse: 67  Temp: 98.3 F (36.8 C)  Resp: 20   Body mass index is 39.39 kg/(m^2). ECOG FS:0 - Asymptomatic Ocular: Sclerae unicteric, pupils equal, round and reactive to light Ear-nose-throat: Oropharynx clear, dentition fair Lymphatic: No cervical or supraclavicular adenopathy Lungs no rales or rhonchi, good excursion bilaterally Heart regular rate and rhythm, no murmur appreciated Abd soft, nontender, positive bowel sounds MSK  no focal spinal tenderness, no joint edema Neuro: non-focal, well-oriented, appropriate affect  LAB RESULTS: CMP     Component Value Date/Time   NA 138 10/19/2013 0250   NA 135 12/28/2012 0809   K 4.3 10/19/2013 0250   K 4.4 12/28/2012 0809   CL 104 10/19/2013 0250   CL 98 12/28/2012 0809   CO2 23 10/19/2013 0250   CO2 26 12/28/2012 0809   GLUCOSE 147* 10/19/2013 0250   GLUCOSE 273* 12/28/2012 0809   BUN 9 10/19/2013 0250   BUN 8 12/28/2012 0809   CREATININE 0.76 10/19/2013 0250   CREATININE 1.50* 02/27/2013 1507   CALCIUM 8.7 10/19/2013 0250   CALCIUM 9.1 12/28/2012 0809   PROT 7.5 10/18/2013 0745   PROT 8.2* 12/28/2012 0809   ALBUMIN 3.4* 10/18/2013 0745   AST 25 10/18/2013 0745   AST 119* 12/28/2012 0809   ALT 21 10/18/2013 0745   ALT 62* 12/28/2012 0809   ALKPHOS 152* 10/18/2013 0745   ALKPHOS 124* 12/28/2012 0809   BILITOT 1.2 10/18/2013 0745   BILITOT 1.70* 12/28/2012 0809   GFRNONAA >90 10/19/2013 0250   GFRAA >90 10/19/2013 0250   No results found for this basename: SPEP, UPEP,  kappa and lambda light chains   Lab Results  Component Value Date   WBC 2.9* 12/21/2013   NEUTROABS 1.6 12/21/2013   HGB 12.9* 12/21/2013   HCT 36.5* 12/21/2013   MCV 84 12/21/2013   PLT 83* 12/21/2013   No results found for this basename: LABCA2   No components found with  this basename: LABCA125   No results found for this basename: INR,  in the last 168 hours  STUDIES: No results found.  ASSESSMENT/PLAN: Oscar [name] is very pleasant 48 year old male with chronic immune thrombocytopenia. He has cirrhosis that is NASH related.  His Hgb is 12.9, platelets today are 83 and he has had no bleeding. I do not think he needs Nplate at this time. He will have his Endoscopy and colonoscopy October 14th.  We will see him back in 2 months for labs and follow-up. He is in agreement with this and knows to call here with any questions or concerns. We can certainly see him back here sooner if need be.  Eliezer Bottom, NP 12/21/2013 4:55 PM

## 2013-12-24 ENCOUNTER — Telehealth: Payer: Self-pay | Admitting: Hematology & Oncology

## 2013-12-24 LAB — IRON AND TIBC CHCC
%SAT: 16 % — ABNORMAL LOW (ref 20–55)
Iron: 74 ug/dL (ref 42–163)
TIBC: 474 ug/dL — ABNORMAL HIGH (ref 202–409)
UIBC: 399 ug/dL — AB (ref 117–376)

## 2013-12-24 LAB — FERRITIN CHCC: Ferritin: 13 ng/ml — ABNORMAL LOW (ref 22–316)

## 2013-12-24 NOTE — Telephone Encounter (Signed)
Mailed oct schedule

## 2014-01-04 ENCOUNTER — Encounter: Payer: Self-pay | Admitting: Internal Medicine

## 2014-01-04 ENCOUNTER — Other Ambulatory Visit: Payer: Self-pay | Admitting: Internal Medicine

## 2014-02-04 ENCOUNTER — Other Ambulatory Visit: Payer: Self-pay | Admitting: Nurse Practitioner

## 2014-02-04 DIAGNOSIS — D693 Immune thrombocytopenic purpura: Secondary | ICD-10-CM

## 2014-02-05 ENCOUNTER — Other Ambulatory Visit (HOSPITAL_BASED_OUTPATIENT_CLINIC_OR_DEPARTMENT_OTHER): Payer: BC Managed Care – PPO | Admitting: Lab

## 2014-02-05 ENCOUNTER — Ambulatory Visit (HOSPITAL_BASED_OUTPATIENT_CLINIC_OR_DEPARTMENT_OTHER): Payer: BC Managed Care – PPO

## 2014-02-05 VITALS — BP 119/66 | HR 72 | Temp 98.6°F | Resp 72

## 2014-02-05 DIAGNOSIS — D696 Thrombocytopenia, unspecified: Secondary | ICD-10-CM

## 2014-02-05 DIAGNOSIS — D693 Immune thrombocytopenic purpura: Secondary | ICD-10-CM

## 2014-02-05 DIAGNOSIS — D6189 Other specified aplastic anemias and other bone marrow failure syndromes: Secondary | ICD-10-CM

## 2014-02-05 DIAGNOSIS — D61818 Other pancytopenia: Secondary | ICD-10-CM

## 2014-02-05 DIAGNOSIS — Z23 Encounter for immunization: Secondary | ICD-10-CM

## 2014-02-05 LAB — CBC WITH DIFFERENTIAL (CANCER CENTER ONLY)
BASO#: 0 10*3/uL (ref 0.0–0.2)
BASO%: 0.4 % (ref 0.0–2.0)
EOS ABS: 0.1 10*3/uL (ref 0.0–0.5)
EOS%: 3.4 % (ref 0.0–7.0)
HCT: 38.1 % — ABNORMAL LOW (ref 38.7–49.9)
HGB: 13.3 g/dL (ref 13.0–17.1)
LYMPH#: 0.8 10*3/uL — AB (ref 0.9–3.3)
LYMPH%: 31.6 % (ref 14.0–48.0)
MCH: 28.8 pg (ref 28.0–33.4)
MCHC: 34.9 g/dL (ref 32.0–35.9)
MCV: 83 fL (ref 82–98)
MONO#: 0.2 10*3/uL (ref 0.1–0.9)
MONO%: 9 % (ref 0.0–13.0)
NEUT%: 55.6 % (ref 40.0–80.0)
NEUTROS ABS: 1.5 10*3/uL (ref 1.5–6.5)
PLATELETS: 72 10*3/uL — AB (ref 145–400)
RBC: 4.62 10*6/uL (ref 4.20–5.70)
RDW: 13.9 % (ref 11.1–15.7)
WBC: 2.7 10*3/uL — AB (ref 4.0–10.0)

## 2014-02-05 MED ORDER — HEPARIN SOD (PORK) LOCK FLUSH 100 UNIT/ML IV SOLN
500.0000 [IU] | Freq: Once | INTRAVENOUS | Status: DC | PRN
  Filled 2014-02-05: qty 5

## 2014-02-05 MED ORDER — INFLUENZA VAC SPLIT QUAD 0.5 ML IM SUSY
0.5000 mL | PREFILLED_SYRINGE | Freq: Once | INTRAMUSCULAR | Status: AC
  Administered 2014-02-05: 0.5 mL via INTRAMUSCULAR
  Filled 2014-02-05: qty 0.5

## 2014-02-05 MED ORDER — HEPARIN SOD (PORK) LOCK FLUSH 100 UNIT/ML IV SOLN
250.0000 [IU] | Freq: Once | INTRAVENOUS | Status: DC | PRN
  Filled 2014-02-05: qty 5

## 2014-02-05 MED ORDER — SODIUM CHLORIDE 0.9 % IJ SOLN
10.0000 mL | INTRAMUSCULAR | Status: DC | PRN
  Filled 2014-02-05: qty 10

## 2014-02-05 MED ORDER — ROMIPLOSTIM 250 MCG ~~LOC~~ SOLR
235.0000 ug | Freq: Once | SUBCUTANEOUS | Status: AC
  Administered 2014-02-05: 235 ug via SUBCUTANEOUS
  Filled 2014-02-05: qty 0.47

## 2014-02-05 MED ORDER — SODIUM CHLORIDE 0.9 % IJ SOLN
3.0000 mL | Freq: Once | INTRAMUSCULAR | Status: DC | PRN
  Filled 2014-02-05: qty 10

## 2014-02-05 MED ORDER — ALTEPLASE 2 MG IJ SOLR
2.0000 mg | Freq: Once | INTRAMUSCULAR | Status: DC | PRN
  Filled 2014-02-05: qty 2

## 2014-02-05 NOTE — Patient Instructions (Signed)
Influenza Virus Vaccine injection (Fluarix) What is this medicine? INFLUENZA VIRUS VACCINE (in floo EN zuh VAHY ruhs vak SEEN) helps to reduce the risk of getting influenza also known as the flu. This medicine may be used for other purposes; ask your health care provider or pharmacist if you have questions. COMMON BRAND NAME(S): Fluarix, Fluzone What should I tell my health care provider before I take this medicine? They need to know if you have any of these conditions: -bleeding disorder like hemophilia -fever or infection -Guillain-Barre syndrome or other neurological problems -immune system problems -infection with the human immunodeficiency virus (HIV) or AIDS -low blood platelet counts -multiple sclerosis -an unusual or allergic reaction to influenza virus vaccine, eggs, chicken proteins, latex, gentamicin, other medicines, foods, dyes or preservatives -pregnant or trying to get pregnant -breast-feeding How should I use this medicine? This vaccine is for injection into a muscle. It is given by a health care professional. A copy of Vaccine Information Statements will be given before each vaccination. Read this sheet carefully each time. The sheet may change frequently. Talk to your pediatrician regarding the use of this medicine in children. Special care may be needed. Overdosage: If you think you have taken too much of this medicine contact a poison control center or emergency room at once. NOTE: This medicine is only for you. Do not share this medicine with others. What if I miss a dose? This does not apply. What may interact with this medicine? -chemotherapy or radiation therapy -medicines that lower your immune system like etanercept, anakinra, infliximab, and adalimumab -medicines that treat or prevent blood clots like warfarin -phenytoin -steroid medicines like prednisone or cortisone -theophylline -vaccines This list may not describe all possible interactions. Give your  health care provider a list of all the medicines, herbs, non-prescription drugs, or dietary supplements you use. Also tell them if you smoke, drink alcohol, or use illegal drugs. Some items may interact with your medicine. What should I watch for while using this medicine? Report any side effects that do not go away within 3 days to your doctor or health care professional. Call your health care provider if any unusual symptoms occur within 6 weeks of receiving this vaccine. You may still catch the flu, but the illness is not usually as bad. You cannot get the flu from the vaccine. The vaccine will not protect against colds or other illnesses that may cause fever. The vaccine is needed every year. What side effects may I notice from receiving this medicine? Side effects that you should report to your doctor or health care professional as soon as possible: -allergic reactions like skin rash, itching or hives, swelling of the face, lips, or tongue Side effects that usually do not require medical attention (report to your doctor or health care professional if they continue or are bothersome): -fever -headache -muscle aches and pains -pain, tenderness, redness, or swelling at site where injected -weak or tired This list may not describe all possible side effects. Call your doctor for medical advice about side effects. You may report side effects to FDA at 1-800-FDA-1088. Where should I keep my medicine? This vaccine is only given in a clinic, pharmacy, doctor's office, or other health care setting and will not be stored at home. NOTE: This sheet is a summary. It may not cover all possible information. If you have questions about this medicine, talk to your doctor, pharmacist, or health care provider.  2015, Elsevier/Gold Standard. (2007-11-08 09:30:40) Romiplostim injection What is this  medicine? ROMIPLOSTIM (roe mi PLOE stim) helps your body make more platelets. This medicine is used to treat low  platelets caused by chronic idiopathic thrombocytopenic purpura (ITP). This medicine may be used for other purposes; ask your health care provider or pharmacist if you have questions. COMMON BRAND NAME(S): Nplate What should I tell my health care provider before I take this medicine? They need to know if you have any of these conditions: -cancer or myelodysplastic syndrome -low blood counts, like low white cell, platelet, or red cell counts -take medicines that treat or prevent blood clots -an unusual or allergic reaction to romiplostim, mannitol, other medicines, foods, dyes, or preservatives -pregnant or trying to get pregnant -breast-feeding How should I use this medicine? This medicine is for injection under the skin. It is given by a health care professional in a hospital or clinic setting. A special MedGuide will be given to you before your injection. Read this information carefully each time. Talk to your pediatrician regarding the use of this medicine in children. Special care may be needed. Overdosage: If you think you have taken too much of this medicine contact a poison control center or emergency room at once. NOTE: This medicine is only for you. Do not share this medicine with others. What if I miss a dose? It is important not to miss your dose. Call your doctor or health care professional if you are unable to keep an appointment. What may interact with this medicine? Interactions are not expected. This list may not describe all possible interactions. Give your health care provider a list of all the medicines, herbs, non-prescription drugs, or dietary supplements you use. Also tell them if you smoke, drink alcohol, or use illegal drugs. Some items may interact with your medicine. What should I watch for while using this medicine? Your condition will be monitored carefully while you are receiving this medicine. Visit your prescriber or health care professional for regular checks  on your progress and for the needed blood tests. It is important to keep all appointments. What side effects may I notice from receiving this medicine? Side effects that you should report to your doctor or health care professional as soon as possible: -allergic reactions like skin rash, itching or hives, swelling of the face, lips, or tongue -shortness of breath, chest pain, swelling in a leg -unusual bleeding or bruising Side effects that usually do not require medical attention (report to your doctor or health care professional if they continue or are bothersome): -dizziness -headache -muscle aches -pain in arms and legs -stomach pain -trouble sleeping This list may not describe all possible side effects. Call your doctor for medical advice about side effects. You may report side effects to FDA at 1-800-FDA-1088. Where should I keep my medicine? This drug is given in a hospital or clinic and will not be stored at home. NOTE: This sheet is a summary. It may not cover all possible information. If you have questions about this medicine, talk to your doctor, pharmacist, or health care provider.  2015, Elsevier/Gold Standard. (2007-12-11 15:13:04)

## 2014-02-06 LAB — APTT: APTT: 35 s (ref 24–37)

## 2014-02-06 LAB — PROTHROMBIN TIME
INR: 1.33 (ref <1.50)
Prothrombin Time: 16.5 seconds — ABNORMAL HIGH (ref 11.6–15.2)

## 2014-02-12 ENCOUNTER — Encounter (HOSPITAL_COMMUNITY): Payer: Self-pay | Admitting: Pharmacy Technician

## 2014-02-20 ENCOUNTER — Encounter (HOSPITAL_COMMUNITY): Payer: Self-pay | Admitting: *Deleted

## 2014-02-22 ENCOUNTER — Telehealth: Payer: Self-pay | Admitting: Hematology & Oncology

## 2014-02-22 ENCOUNTER — Ambulatory Visit (HOSPITAL_BASED_OUTPATIENT_CLINIC_OR_DEPARTMENT_OTHER): Payer: BC Managed Care – PPO | Admitting: Hematology & Oncology

## 2014-02-22 ENCOUNTER — Encounter: Payer: Self-pay | Admitting: Hematology & Oncology

## 2014-02-22 ENCOUNTER — Other Ambulatory Visit (HOSPITAL_BASED_OUTPATIENT_CLINIC_OR_DEPARTMENT_OTHER): Payer: BC Managed Care – PPO | Admitting: Lab

## 2014-02-22 VITALS — BP 110/61 | HR 75 | Temp 98.5°F | Resp 18 | Ht 68.0 in | Wt 264.0 lb

## 2014-02-22 DIAGNOSIS — K760 Fatty (change of) liver, not elsewhere classified: Secondary | ICD-10-CM

## 2014-02-22 DIAGNOSIS — D696 Thrombocytopenia, unspecified: Secondary | ICD-10-CM

## 2014-02-22 DIAGNOSIS — K7581 Nonalcoholic steatohepatitis (NASH): Secondary | ICD-10-CM

## 2014-02-22 DIAGNOSIS — D693 Immune thrombocytopenic purpura: Secondary | ICD-10-CM

## 2014-02-22 DIAGNOSIS — K7460 Unspecified cirrhosis of liver: Secondary | ICD-10-CM

## 2014-02-22 LAB — COMPREHENSIVE METABOLIC PANEL
ALBUMIN: 3.5 g/dL (ref 3.5–5.2)
ALT: 21 U/L (ref 0–53)
AST: 24 U/L (ref 0–37)
Alkaline Phosphatase: 128 U/L — ABNORMAL HIGH (ref 39–117)
BUN: 9 mg/dL (ref 6–23)
CALCIUM: 9 mg/dL (ref 8.4–10.5)
CHLORIDE: 105 meq/L (ref 96–112)
CO2: 19 meq/L (ref 19–32)
CREATININE: 0.67 mg/dL (ref 0.50–1.35)
Glucose, Bld: 199 mg/dL — ABNORMAL HIGH (ref 70–99)
POTASSIUM: 4.1 meq/L (ref 3.5–5.3)
Sodium: 136 mEq/L (ref 135–145)
Total Bilirubin: 1.4 mg/dL — ABNORMAL HIGH (ref 0.2–1.2)
Total Protein: 6.5 g/dL (ref 6.0–8.3)

## 2014-02-22 LAB — CBC WITH DIFFERENTIAL (CANCER CENTER ONLY)
BASO#: 0 10*3/uL (ref 0.0–0.2)
BASO%: 0.3 % (ref 0.0–2.0)
EOS%: 1.9 % (ref 0.0–7.0)
Eosinophils Absolute: 0.1 10*3/uL (ref 0.0–0.5)
HEMATOCRIT: 38.5 % — AB (ref 38.7–49.9)
HGB: 13.5 g/dL (ref 13.0–17.1)
LYMPH#: 1.1 10*3/uL (ref 0.9–3.3)
LYMPH%: 33.5 % (ref 14.0–48.0)
MCH: 29.3 pg (ref 28.0–33.4)
MCHC: 35.1 g/dL (ref 32.0–35.9)
MCV: 84 fL (ref 82–98)
MONO#: 0.3 10*3/uL (ref 0.1–0.9)
MONO%: 7.8 % (ref 0.0–13.0)
NEUT%: 56.5 % (ref 40.0–80.0)
NEUTROS ABS: 1.8 10*3/uL (ref 1.5–6.5)
Platelets: 162 10*3/uL (ref 145–400)
RBC: 4.6 10*6/uL (ref 4.20–5.70)
RDW: 14.2 % (ref 11.1–15.7)
WBC: 3.2 10*3/uL — ABNORMAL LOW (ref 4.0–10.0)

## 2014-02-22 NOTE — Telephone Encounter (Signed)
Nplate  ([id number]) APPROVED by [employer] [place]  Valid: 02/05/2014 - 02/05/2019  Auth: [id number]  W:[phone]

## 2014-02-22 NOTE — Progress Notes (Signed)
Hematology and Oncology Follow Up Visit  Oscar [name] [id number] 16-Jul-1965 48 y.o. 02/22/2014   Principle Diagnosis:   Chronic immune thrombocytopenia  Cirrhosis  Leukopenia  Anemia secondary to intermittent GI bleeding  Current Therapy:    Nplate as needed for any procedure  IV iron as indicated     Interim History:  Mr.  [name] is back for followup. He now is scheduled to have a colonoscopy and upper endoscopy on November 7. He has had polyps. A colonoscopy previously had a lot of bleeding. The gastroenterologist wants to make sure that his platelet count is adequate. He got a dose of Nplate 2 weeks ago.   He's been no rectal bleeding. He's had no abdominal pain. He's had no cough.   He had his cervical spine surgery without any issues. This was in February. He had no bleeding. He's still having some problems with stiffness. He is getting some physical therapy for the neck issues.  Medications: Current outpatient prescriptions:atenolol (TENORMIN) 50 MG tablet, Take 50 mg by mouth every morning., Disp: , Rfl: ;  diphenhydrAMINE (BENADRYL) 25 mg capsule, Take 25 mg by mouth every 6 (six) hours as needed for itching., Disp: , Rfl: ;  docusate sodium (COLACE) 100 MG capsule, Take 100 mg by mouth daily as needed for mild constipation. , Disp: , Rfl:  esomeprazole (NEXIUM) 40 MG capsule, Take 1 capsule (40 mg total) by mouth 2 (two) times daily before a meal., Disp: 60 capsule, Rfl: 2;  hydrochlorothiazide (HYDRODIURIL) 25 MG tablet, Take 25 mg by mouth every morning., Disp: , Rfl: ;  HYDROcodone-acetaminophen (NORCO) 10-325 MG per tablet, Take 0.5-1 tablets by mouth every 6 (six) hours as needed for moderate pain., Disp: , Rfl:  lisinopril (PRINIVIL,ZESTRIL) 20 MG tablet, Take 20 mg by mouth every morning., Disp: , Rfl: ;  NOVOLIN N 100 UNIT/ML injection, Inject 10 Units into the skin at bedtime. , Disp: , Rfl: ;  NOVOLOG FLEXPEN 100 UNIT/ML SOPN FlexPen, Inject 5-10 Units into  the skin 3 (three) times daily. , Disp: , Rfl: ;  propranolol (INDERAL) 10 MG tablet, Take 10 mg by mouth 2 (two) times daily., Disp: , Rfl:  sitaGLIPtin-metformin (JANUMET) 50-1000 MG per tablet, Take 1 tablet by mouth 2 (two) times daily with a meal., Disp: 180 tablet, Rfl: 1;  sodium chloride (OCEAN) 0.65 % SOLN nasal spray, Place 2 sprays into both nostrils daily as needed for congestion., Disp: , Rfl:   Allergies:  Allergies  Allergen Reactions  . Byetta 10 Mcg Pen [Exenatide] Nausea And Vomiting    Past Medical History, Surgical history, Social history, and Family History were reviewed and updated.  Review of Systems: As above  Physical Exam:  height is 5' 8"  (1.727 m) and weight is 264 lb (119.75 kg). His oral temperature is 98.5 F (36.9 C). His blood pressure is 110/61 and his pulse is 75. His respiration is 18.   Mildly obese white gentleman. Head and neck exam shows no ocular or oral lesions. Neck is supple with no adenopathy. Lungs are clear bilaterally. Cardiac exam regular rate rhythm. Abdomen soft. Mildly obese. No palpable liver or spleen tip. Back exam no tenderness over the spine. Extremities no clubbing cyanosis or edema. Skin exam no ecchymoses or petechia.  Lab Results  Component Value Date   WBC 3.2* 02/22/2014   HGB 13.5 02/22/2014   HCT 38.5* 02/22/2014   MCV 84 02/22/2014   PLT 162 02/22/2014     Chemistry  Component Value Date/Time   NA 133* 12/21/2013 1505   NA 135 12/28/2012 0809   K 3.8 12/21/2013 1505   K 4.4 12/28/2012 0809   CL 103 12/21/2013 1505   CL 98 12/28/2012 0809   CO2 23 12/21/2013 1505   CO2 26 12/28/2012 0809   BUN 13 12/21/2013 1505   BUN 8 12/28/2012 0809   CREATININE 0.69 12/21/2013 1505   CREATININE 1.50* 02/27/2013 1507      Component Value Date/Time   CALCIUM 8.8 12/21/2013 1505   CALCIUM 9.1 12/28/2012 0809   ALKPHOS 144* 12/21/2013 1505   ALKPHOS 124* 12/28/2012 0809   AST 23 12/21/2013 1505   AST 119* 12/28/2012 0809   ALT 21 12/21/2013  1505   ALT 62* 12/28/2012 0809   BILITOT 1.0 12/21/2013 1505   BILITOT 1.70* 12/28/2012 0809         Impression and Plan: Oscar [name] is 48 year old male with chronic immune thrombocytopenia. He has cirrhosis. This is NASH related. He has not had a drink now for one year.  We will give him Nplate prior to his colonoscopy and upper endoscopy. His plantar count is fantastic today. However, I want to make sure that his platelet count does stay up to minimize any bleeding risk. As such, we will do this again next Friday. That should be adequate.  I want to get him back to see me in about 3 months for follow-up. I think if all looks good then, we probably see him back as needed.      Volanda Napoleon, MD 10/30/20153:33 PM

## 2014-02-25 DIAGNOSIS — Z86010 Personal history of colonic polyps: Secondary | ICD-10-CM | POA: Insufficient documentation

## 2014-02-25 LAB — IRON AND TIBC CHCC
%SAT: 22 % (ref 20–55)
Iron: 108 ug/dL (ref 42–163)
TIBC: 488 ug/dL — ABNORMAL HIGH (ref 202–409)
UIBC: 380 ug/dL — AB (ref 117–376)

## 2014-02-25 LAB — FERRITIN CHCC: FERRITIN: 10 ng/mL — AB (ref 22–316)

## 2014-03-01 ENCOUNTER — Other Ambulatory Visit (HOSPITAL_BASED_OUTPATIENT_CLINIC_OR_DEPARTMENT_OTHER): Payer: BC Managed Care – PPO | Admitting: Lab

## 2014-03-01 ENCOUNTER — Ambulatory Visit (HOSPITAL_BASED_OUTPATIENT_CLINIC_OR_DEPARTMENT_OTHER): Payer: BC Managed Care – PPO

## 2014-03-01 DIAGNOSIS — K760 Fatty (change of) liver, not elsewhere classified: Secondary | ICD-10-CM

## 2014-03-01 DIAGNOSIS — D693 Immune thrombocytopenic purpura: Secondary | ICD-10-CM

## 2014-03-01 DIAGNOSIS — D6189 Other specified aplastic anemias and other bone marrow failure syndromes: Secondary | ICD-10-CM

## 2014-03-01 DIAGNOSIS — D61818 Other pancytopenia: Secondary | ICD-10-CM

## 2014-03-01 LAB — COMPREHENSIVE METABOLIC PANEL
ALBUMIN: 3.6 g/dL (ref 3.5–5.2)
ALT: 21 U/L (ref 0–53)
AST: 25 U/L (ref 0–37)
Alkaline Phosphatase: 102 U/L (ref 39–117)
BUN: 10 mg/dL (ref 6–23)
CHLORIDE: 102 meq/L (ref 96–112)
CO2: 23 meq/L (ref 19–32)
Calcium: 9.4 mg/dL (ref 8.4–10.5)
Creatinine, Ser: 0.87 mg/dL (ref 0.50–1.35)
Glucose, Bld: 202 mg/dL — ABNORMAL HIGH (ref 70–99)
Potassium: 3.9 mEq/L (ref 3.5–5.3)
SODIUM: 136 meq/L (ref 135–145)
Total Bilirubin: 1.6 mg/dL — ABNORMAL HIGH (ref 0.2–1.2)
Total Protein: 7.1 g/dL (ref 6.0–8.3)

## 2014-03-01 LAB — CBC WITH DIFFERENTIAL (CANCER CENTER ONLY)
BASO#: 0 10*3/uL (ref 0.0–0.2)
BASO%: 0.2 % (ref 0.0–2.0)
EOS%: 2.8 % (ref 0.0–7.0)
Eosinophils Absolute: 0.1 10*3/uL (ref 0.0–0.5)
HEMATOCRIT: 39.6 % (ref 38.7–49.9)
HEMOGLOBIN: 13.6 g/dL (ref 13.0–17.1)
LYMPH#: 1.4 10*3/uL (ref 0.9–3.3)
LYMPH%: 32.2 % (ref 14.0–48.0)
MCH: 28.8 pg (ref 28.0–33.4)
MCHC: 34.3 g/dL (ref 32.0–35.9)
MCV: 84 fL (ref 82–98)
MONO#: 0.4 10*3/uL (ref 0.1–0.9)
MONO%: 9 % (ref 0.0–13.0)
NEUT#: 2.4 10*3/uL (ref 1.5–6.5)
NEUT%: 55.8 % (ref 40.0–80.0)
Platelets: 87 10*3/uL — ABNORMAL LOW (ref 145–400)
RBC: 4.72 10*6/uL (ref 4.20–5.70)
RDW: 14.3 % (ref 11.1–15.7)
WBC: 4.2 10*3/uL (ref 4.0–10.0)

## 2014-03-01 LAB — RETICULOCYTES (CHCC)
ABS RETIC: 56.8 10*3/uL (ref 19.0–186.0)
RBC.: 4.73 MIL/uL (ref 4.22–5.81)
RETIC CT PCT: 1.2 % (ref 0.4–2.3)

## 2014-03-01 MED ORDER — ROMIPLOSTIM 250 MCG ~~LOC~~ SOLR
2.0000 ug/kg | Freq: Once | SUBCUTANEOUS | Status: AC
  Administered 2014-03-01: 240 ug via SUBCUTANEOUS
  Filled 2014-03-01: qty 0.48

## 2014-03-01 NOTE — Patient Instructions (Signed)
Romiplostim injection What is this medicine? ROMIPLOSTIM (roe mi PLOE stim) helps your body make more platelets. This medicine is used to treat low platelets caused by chronic idiopathic thrombocytopenic purpura (ITP). This medicine may be used for other purposes; ask your health care provider or pharmacist if you have questions. COMMON BRAND NAME(S): Nplate What should I tell my health care provider before I take this medicine? They need to know if you have any of these conditions: -cancer or myelodysplastic syndrome -low blood counts, like low white cell, platelet, or red cell counts -take medicines that treat or prevent blood clots -an unusual or allergic reaction to romiplostim, mannitol, other medicines, foods, dyes, or preservatives -pregnant or trying to get pregnant -breast-feeding How should I use this medicine? This medicine is for injection under the skin. It is given by a health care professional in a hospital or clinic setting. A special MedGuide will be given to you before your injection. Read this information carefully each time. Talk to your pediatrician regarding the use of this medicine in children. Special care may be needed. Overdosage: If you think you have taken too much of this medicine contact a poison control center or emergency room at once. NOTE: This medicine is only for you. Do not share this medicine with others. What if I miss a dose? It is important not to miss your dose. Call your doctor or health care professional if you are unable to keep an appointment. What may interact with this medicine? Interactions are not expected. This list may not describe all possible interactions. Give your health care provider a list of all the medicines, herbs, non-prescription drugs, or dietary supplements you use. Also tell them if you smoke, drink alcohol, or use illegal drugs. Some items may interact with your medicine. What should I watch for while using this  medicine? Your condition will be monitored carefully while you are receiving this medicine. Visit your prescriber or health care professional for regular checks on your progress and for the needed blood tests. It is important to keep all appointments. What side effects may I notice from receiving this medicine? Side effects that you should report to your doctor or health care professional as soon as possible: -allergic reactions like skin rash, itching or hives, swelling of the face, lips, or tongue -shortness of breath, chest pain, swelling in a leg -unusual bleeding or bruising Side effects that usually do not require medical attention (report to your doctor or health care professional if they continue or are bothersome): -dizziness -headache -muscle aches -pain in arms and legs -stomach pain -trouble sleeping This list may not describe all possible side effects. Call your doctor for medical advice about side effects. You may report side effects to FDA at 1-800-FDA-1088. Where should I keep my medicine? This drug is given in a hospital or clinic and will not be stored at home. NOTE: This sheet is a summary. It may not cover all possible information. If you have questions about this medicine, talk to your doctor, pharmacist, or health care provider.  2015, Elsevier/Gold Standard. (2007-12-11 15:13:04)  

## 2014-03-04 LAB — FERRITIN CHCC: Ferritin: 12 ng/ml — ABNORMAL LOW (ref 22–316)

## 2014-03-04 LAB — IRON AND TIBC CHCC
%SAT: 31 % (ref 20–55)
Iron: 158 ug/dL (ref 42–163)
TIBC: 513 ug/dL — ABNORMAL HIGH (ref 202–409)
UIBC: 354 ug/dL (ref 117–376)

## 2014-03-05 ENCOUNTER — Other Ambulatory Visit: Payer: Self-pay | Admitting: Gastroenterology

## 2014-03-05 NOTE — Anesthesia Preprocedure Evaluation (Addendum)
Anesthesia Evaluation  Patient identified by MRN, date of birth, ID band Patient awake    Reviewed: Allergy & Precautions, H&P , NPO status , Patient's Chart, lab work & pertinent test results  History of Anesthesia Complications Negative for: history of anesthetic complications  Airway Mallampati: II  TM Distance: >3 FB Neck ROM: Full    Dental no notable dental hx. (+) Dental Advisory Given   Pulmonary sleep apnea (reports this has slowly resolved and he no longer wears a CPAP) ,  breath sounds clear to auscultation  Pulmonary exam normal       Cardiovascular hypertension, Pt. on medications and Pt. on home beta blockers Rhythm:Regular Rate:Normal     Neuro/Psych  Headaches, negative psych ROS   GI/Hepatic GERD-  Medicated and Controlled,(+) Hepatitis -, A  Endo/Other  diabetes, Type 2, Insulin Dependent, Oral Hypoglycemic AgentsMorbid obesity  Renal/GU negative Renal ROS  negative genitourinary   Musculoskeletal  (+) Arthritis -, Osteoarthritis,    Abdominal (+) + obese,   Peds negative pediatric ROS (+)  Hematology Hx of ITP or chronic immune thrompocytopenia, followed by heme/onc and receiving Nplate injections prior to procedure   Anesthesia Other Findings   Reproductive/Obstetrics negative OB ROS                            Anesthesia Physical Anesthesia Plan  ASA: III  Anesthesia Plan: MAC   Post-op Pain Management:    Induction: Intravenous  Airway Management Planned: Nasal Cannula  Additional Equipment:   Intra-op Plan:   Post-operative Plan: Extubation in OR  Informed Consent: I have reviewed the patients History and Physical, chart, labs and discussed the procedure including the risks, benefits and alternatives for the proposed anesthesia with the patient or authorized representative who has indicated his/her understanding and acceptance.   Dental advisory  given  Plan Discussed with: CRNA  Anesthesia Plan Comments:         Anesthesia Quick Evaluation

## 2014-03-06 ENCOUNTER — Ambulatory Visit (HOSPITAL_COMMUNITY): Payer: BC Managed Care – PPO | Admitting: Anesthesiology

## 2014-03-06 ENCOUNTER — Telehealth: Payer: Self-pay | Admitting: Hematology & Oncology

## 2014-03-06 ENCOUNTER — Ambulatory Visit (HOSPITAL_COMMUNITY)
Admission: RE | Admit: 2014-03-06 | Discharge: 2014-03-06 | Disposition: A | Discharge status: Home / Self Care | Payer: BC Managed Care – PPO | Source: Ambulatory Visit | Attending: Gastroenterology | Admitting: Gastroenterology

## 2014-03-06 ENCOUNTER — Encounter (HOSPITAL_COMMUNITY)
Admission: RE | Disposition: A | Discharge status: Home / Self Care | Payer: Self-pay | Source: Ambulatory Visit | Attending: Gastroenterology

## 2014-03-06 ENCOUNTER — Encounter (HOSPITAL_COMMUNITY): Payer: Self-pay

## 2014-03-06 DIAGNOSIS — R51 Headache: Secondary | ICD-10-CM | POA: Diagnosis not present

## 2014-03-06 DIAGNOSIS — K219 Gastro-esophageal reflux disease without esophagitis: Secondary | ICD-10-CM | POA: Diagnosis not present

## 2014-03-06 DIAGNOSIS — G4730 Sleep apnea, unspecified: Secondary | ICD-10-CM | POA: Diagnosis not present

## 2014-03-06 DIAGNOSIS — K635 Polyp of colon: Secondary | ICD-10-CM | POA: Diagnosis not present

## 2014-03-06 DIAGNOSIS — D122 Benign neoplasm of ascending colon: Secondary | ICD-10-CM | POA: Insufficient documentation

## 2014-03-06 DIAGNOSIS — Z6841 Body Mass Index (BMI) 40.0 and over, adult: Secondary | ICD-10-CM | POA: Insufficient documentation

## 2014-03-06 DIAGNOSIS — Z888 Allergy status to other drugs, medicaments and biological substances status: Secondary | ICD-10-CM | POA: Insufficient documentation

## 2014-03-06 DIAGNOSIS — D124 Benign neoplasm of descending colon: Secondary | ICD-10-CM | POA: Diagnosis not present

## 2014-03-06 DIAGNOSIS — I8510 Secondary esophageal varices without bleeding: Secondary | ICD-10-CM | POA: Diagnosis not present

## 2014-03-06 DIAGNOSIS — I10 Essential (primary) hypertension: Secondary | ICD-10-CM | POA: Insufficient documentation

## 2014-03-06 DIAGNOSIS — K7460 Unspecified cirrhosis of liver: Secondary | ICD-10-CM | POA: Insufficient documentation

## 2014-03-06 DIAGNOSIS — R161 Splenomegaly, not elsewhere classified: Secondary | ICD-10-CM | POA: Insufficient documentation

## 2014-03-06 DIAGNOSIS — Z794 Long term (current) use of insulin: Secondary | ICD-10-CM | POA: Diagnosis not present

## 2014-03-06 DIAGNOSIS — E119 Type 2 diabetes mellitus without complications: Secondary | ICD-10-CM | POA: Insufficient documentation

## 2014-03-06 DIAGNOSIS — M1990 Unspecified osteoarthritis, unspecified site: Secondary | ICD-10-CM | POA: Insufficient documentation

## 2014-03-06 HISTORY — PX: EUS: SHX5427

## 2014-03-06 HISTORY — PX: COLONOSCOPY WITH PROPOFOL: SHX5780

## 2014-03-06 HISTORY — PX: ESOPHAGOGASTRODUODENOSCOPY (EGD) WITH PROPOFOL: SHX5813

## 2014-03-06 LAB — GLUCOSE, CAPILLARY: GLUCOSE-CAPILLARY: 167 mg/dL — AB (ref 70–99)

## 2014-03-06 SURGERY — ESOPHAGOGASTRODUODENOSCOPY (EGD) WITH PROPOFOL
Anesthesia: Monitor Anesthesia Care

## 2014-03-06 MED ORDER — LIDOCAINE HCL (CARDIAC) 20 MG/ML IV SOLN
INTRAVENOUS | Status: AC
  Filled 2014-03-06: qty 5

## 2014-03-06 MED ORDER — LACTATED RINGERS IV SOLN
INTRAVENOUS | Status: DC | PRN
  Administered 2014-03-06 (×2): via INTRAVENOUS

## 2014-03-06 MED ORDER — PROPOFOL 10 MG/ML IV BOLUS
INTRAVENOUS | Status: AC
  Filled 2014-03-06: qty 20

## 2014-03-06 MED ORDER — PROPOFOL INFUSION 10 MG/ML OPTIME
INTRAVENOUS | Status: DC | PRN
  Administered 2014-03-06: 140 ug/kg/min via INTRAVENOUS

## 2014-03-06 MED ORDER — PROPOFOL 10 MG/ML IV BOLUS
INTRAVENOUS | Status: DC | PRN
  Administered 2014-03-06: 50 mg via INTRAVENOUS

## 2014-03-06 MED ORDER — LIDOCAINE HCL (CARDIAC) 20 MG/ML IV SOLN
INTRAVENOUS | Status: DC | PRN
  Administered 2014-03-06: 50 mg via INTRAVENOUS

## 2014-03-06 MED ORDER — SODIUM CHLORIDE 0.9 % IV SOLN: INTRAVENOUS | Status: DC

## 2014-03-06 SURGICAL SUPPLY — 24 items

## 2014-03-06 NOTE — Discharge Instructions (Addendum)

## 2014-03-06 NOTE — Op Note (Signed)
[facility] [place], [address]   ENDOSCOPIC ULTRASOUND PROCEDURE REPORT  PATIENT: Oscar [name], Oscar [name]  MR#: [id number] BIRTHDATE: Nov 12, 1965  GENDER: male ENDOSCOPIST: Arta Silence, MD REFERRED BY:  Emeline General, M.D. PROCEDURE DATE:  03/06/2014 PROCEDURE:   Upper EUS ASA CLASS:      Class III INDICATIONS:   1.  antral nodularity, cirrhosis.Marland Kitchen MEDICATIONS: Monitored anesthesia care  DESCRIPTION OF PROCEDURE:   After the risks benefits and alternatives of the procedure were  explained, informed consent was obtained. The patient was then placed in the left, lateral, decubitus postion and IV sedation was administered. Throughout the procedure, the patients blood pressure, pulse and oxygen saturations were monitored continuously.  Under direct visualization, the     endoscope was introduced through the mouth and advanced to the second portion of the duodenum .  Water was used as necessary to provide an acoustic interface.  Upon completion of the imaging, water was removed and the patient was sent to the recovery room in satisfactory condition.    FINDINGS:      Incidental notation of significant splenomegaly and dilated portal vein.  Ultrasound of region of antral nodularity was performed, and there was significant vascular flow through this region.  Incidental limited views of pancreatic head, body, tail, ampulla were grossly normal.  No perigastric adenopathy.  IMPRESSION:     As above.  Antral nodularity most consistent with gastric (antral) varices.  RECOMMENDATIONS:     1.  Watch for potential complications of procedure. 2.  Would need to consider up-titration of propranolol dosing (currently 10 mg bid), as HR/BP tolerates. 3.  Proceed with colonoscopy today.   _______________________________ Lorrin MaisArta Silence, MD 03/06/2014 10:45 AM   CC:

## 2014-03-06 NOTE — Transfer of Care (Signed)
Immediate Anesthesia Transfer of Care Note  Patient: Oscar [name]  Procedure(s) Performed: Procedure(s): ESOPHAGOGASTRODUODENOSCOPY (EGD) WITH PROPOFOL (N/A) ESOPHAGEAL ENDOSCOPIC ULTRASOUND (EUS) RADIAL (N/A) COLONOSCOPY WITH PROPOFOL (N/A)  Patient Location: PACU  Anesthesia Type:MAC  Level of Consciousness: awake, alert  and oriented  Airway & Oxygen Therapy: Patient Spontanous Breathing and Patient connected to nasal cannula oxygen  Post-op Assessment: Report given to PACU RN and Post -op Vital signs reviewed and stable  Post vital signs: Reviewed and stable  Complications: No apparent anesthesia complications

## 2014-03-06 NOTE — Op Note (Signed)
[facility] [place], [address]   ENDOSCOPY PROCEDURE REPORT  PATIENT: Oscar, [name]  MR#: [id number] BIRTHDATE: September 30, 1965 , 34  yrs. old GENDER: male ENDOSCOPIST: Arta Silence, MD REFERRED BY:  Emeline General, M.D. PROCEDURE DATE:  03/15/14 PROCEDURE:  EGD, diagnostic ASA CLASS:     Class III INDICATIONS:  cirrhosis, antral nodules. MEDICATIONS: Monitored anesthesia care TOPICAL ANESTHETIC:  DESCRIPTION OF PROCEDURE: After the risks benefits and alternatives of the procedure were thoroughly explained, informed consent was obtained.  The [place] [id number] endoscope was introduced through the mouth and advanced to the second portion of the duodenum. The instrument was slowly withdrawn as the mucosa was fully examined.    Findings:  Three columns of small, easily deflatable, distal esophageal varices without bleeding stigmata.  Pre-pyloric antral nodularity again noted.  Otherwise normal to second portion of duodenum. [     The scope was then withdrawn from the patient and the procedure completed.  COMPLICATIONS: There were no immediate complications.  ENDOSCOPIC IMPRESSION:     As above.  RECOMMENDATIONS:     1.  Proceeding with upper endoscopic ultrasound.  eSigned:  Arta Silence, MD March 15, 2014 10:40 AM   CC:  CPT CODES: ICD CODES:  The ICD and CPT codes recommended by this software are interpretations from the data that the clinical staff has captured with the software.  The verification of the translation of this report to the ICD and CPT codes and modifiers is the sole responsibility of the health care institution and practicing physician where this report was generated.  [employer] will not be held responsible for the validity of the ICD and CPT codes included on this report.  AMA assumes no liability for data contained or not contained herein. CPT is a Designer, television/film set of the  [employer].

## 2014-03-06 NOTE — Addendum Note (Signed)
Addended by: Arta Silence on: 03/06/2014 09:29 AM   Modules accepted: Orders

## 2014-03-06 NOTE — Telephone Encounter (Signed)
Confirmation Thank you for completing your registration online. Your card is now ready to use. Your confirmation number is [id number]. Print this page to save it for your records. Simply hand your Nplate FIRST STEP Program card over to the office staff when you arrive for your Nplate (romiplostim) treatment. The card can then be used to help cover the cost of your deductible, co-insurance, and/or co-payment for your Nplate.

## 2014-03-06 NOTE — Interval H&P Note (Signed)
History and Physical Interval Note:  03/06/2014 9:33 AM  Oscar [name]  has presented today for surgery, with the diagnosis of cirrh./anemia/personal hx of polyps  The various methods of treatment have been discussed with the patient and family. After consideration of risks, benefits and other options for treatment, the patient has consented to  Procedure(s): ESOPHAGOGASTRODUODENOSCOPY (EGD) WITH PROPOFOL (N/A) ESOPHAGEAL ENDOSCOPIC ULTRASOUND (EUS) RADIAL (N/A) COLONOSCOPY WITH PROPOFOL (N/A) as a surgical intervention .  The patient's history has been reviewed, patient examined, no change in status, stable for surgery.  I have reviewed the patient's chart and labs.  Questions were answered to the patient's satisfaction.     Oscar [name] M  Assessment:  1.  Cirrhosis, ? Gastric antral varices. 2.  Colon polyps.  Plan:  1.  Endoscopy and upper endoscopic ultrasound. 2.  Risks (bleeding, infection, bowel perforation that could require surgery, sedation-related changes in cardiopulmonary systems), benefits (identification and possible treatment of source of symptoms, exclusion of certain causes of symptoms), and alternatives (watchful waiting, radiographic imaging studies, empiric medical treatment) of upper endoscopy and ultrasound (EGD + EUS) were explained to patient/family in detail and patient wishes to proceed. 3.  Colonoscopy. 4.  Risks (bleeding, infection, bowel perforation that could require surgery, sedation-related changes in cardiopulmonary systems), benefits (identification and possible treatment of source of symptoms, exclusion of certain causes of symptoms), and alternatives (watchful waiting, radiographic imaging studies, empiric medical treatment) of colonoscopy were explained to patient/family in detail and patient wishes to proceed.

## 2014-03-06 NOTE — H&P (View-Only) (Signed)
Hematology and Oncology Follow Up Visit  Oscar [name] [id number] [birth year] 48 y.o. 02/22/2014   Principle Diagnosis:   Chronic immune thrombocytopenia  Cirrhosis  Leukopenia  Anemia secondary to intermittent GI bleeding  Current Therapy:    Nplate as needed for any procedure  IV iron as indicated     Interim History:  Oscar [name] is back for followup. He now is scheduled to have a colonoscopy and upper endoscopy on November 7. He has had polyps. A colonoscopy previously had a lot of bleeding. The gastroenterologist wants to make sure that his platelet count is adequate. He got a dose of Nplate 2 weeks ago.   He's been no rectal bleeding. He's had no abdominal pain. He's had no cough.   He had his cervical spine surgery without any issues. This was in February. He had no bleeding. He's still having some problems with stiffness. He is getting some physical therapy for the neck issues.  Medications: Current outpatient prescriptions:atenolol (TENORMIN) 50 MG tablet, Take 50 mg by mouth every morning., Disp: , Rfl: ;  diphenhydrAMINE (BENADRYL) 25 mg capsule, Take 25 mg by mouth every 6 (six) hours as needed for itching., Disp: , Rfl: ;  docusate sodium (COLACE) 100 MG capsule, Take 100 mg by mouth daily as needed for mild constipation. , Disp: , Rfl:  esomeprazole (NEXIUM) 40 MG capsule, Take 1 capsule (40 mg total) by mouth 2 (two) times daily before a meal., Disp: 60 capsule, Rfl: 2;  hydrochlorothiazide (HYDRODIURIL) 25 MG tablet, Take 25 mg by mouth every morning., Disp: , Rfl: ;  HYDROcodone-acetaminophen (NORCO) 10-325 MG per tablet, Take 0.5-1 tablets by mouth every 6 (six) hours as needed for moderate pain., Disp: , Rfl:  lisinopril (PRINIVIL,ZESTRIL) 20 MG tablet, Take 20 mg by mouth every morning., Disp: , Rfl: ;  NOVOLIN N 100 UNIT/ML injection, Inject 10 Units into the skin at bedtime. , Disp: , Rfl: ;  NOVOLOG FLEXPEN 100 UNIT/ML SOPN FlexPen, Inject 5-10 Units into  the skin 3 (three) times daily. , Disp: , Rfl: ;  propranolol (INDERAL) 10 MG tablet, Take 10 mg by mouth 2 (two) times daily., Disp: , Rfl:  sitaGLIPtin-metformin (JANUMET) 50-1000 MG per tablet, Take 1 tablet by mouth 2 (two) times daily with a meal., Disp: 180 tablet, Rfl: 1;  sodium chloride (OCEAN) 0.65 % SOLN nasal spray, Place 2 sprays into both nostrils daily as needed for congestion., Disp: , Rfl:   Allergies:  Allergies  Allergen Reactions  . Byetta 10 Mcg Pen [Exenatide] Nausea And Vomiting    Past Medical History, Surgical history, Social history, and Family History were reviewed and updated.  Review of Systems: As above  Physical Exam:  height is 5' 8"  (1.727 m) and weight is 264 lb (119.75 kg). His oral temperature is 98.5 F (36.9 C). His blood pressure is 110/61 and his pulse is 75. His respiration is 18.   Mildly obese white gentleman. Head and neck exam shows no ocular or oral lesions. Neck is supple with no adenopathy. Lungs are clear bilaterally. Cardiac exam regular rate rhythm. Abdomen soft. Mildly obese. No palpable liver or spleen tip. Back exam no tenderness over the spine. Extremities no clubbing cyanosis or edema. Skin exam no ecchymoses or petechia.  Lab Results  Component Value Date   WBC 3.2* 02/22/2014   HGB 13.5 02/22/2014   HCT 38.5* 02/22/2014   MCV 84 02/22/2014   PLT 162 02/22/2014     Chemistry  Component Value Date/Time   NA 133* 12/21/2013 1505   NA 135 12/28/2012 0809   K 3.8 12/21/2013 1505   K 4.4 12/28/2012 0809   CL 103 12/21/2013 1505   CL 98 12/28/2012 0809   CO2 23 12/21/2013 1505   CO2 26 12/28/2012 0809   BUN 13 12/21/2013 1505   BUN 8 12/28/2012 0809   CREATININE 0.69 12/21/2013 1505   CREATININE 1.50* 02/27/2013 1507      Component Value Date/Time   CALCIUM 8.8 12/21/2013 1505   CALCIUM 9.1 12/28/2012 0809   ALKPHOS 144* 12/21/2013 1505   ALKPHOS 124* 12/28/2012 0809   AST 23 12/21/2013 1505   AST 119* 12/28/2012 0809   ALT 21 12/21/2013  1505   ALT 62* 12/28/2012 0809   BILITOT 1.0 12/21/2013 1505   BILITOT 1.70* 12/28/2012 0809         Impression and Plan: Oscar [name] is 48 year old male with chronic immune thrombocytopenia. He has cirrhosis. This is NASH related. He has not had a drink now for one year.  We will give him Nplate prior to his colonoscopy and upper endoscopy. His plantar count is fantastic today. However, I want to make sure that his platelet count does stay up to minimize any bleeding risk. As such, we will do this again next Friday. That should be adequate.  I want to get him back to see me in about 3 months for follow-up. I think if all looks good then, we probably see him back as needed.      Volanda Napoleon, MD 10/30/20153:33 PM

## 2014-03-06 NOTE — Anesthesia Postprocedure Evaluation (Signed)
  Anesthesia Post-op Note  Patient: Oscar [name]  Procedure(s) Performed: Procedure(s) (LRB): ESOPHAGOGASTRODUODENOSCOPY (EGD) WITH PROPOFOL (N/A) ESOPHAGEAL ENDOSCOPIC ULTRASOUND (EUS) RADIAL (N/A) COLONOSCOPY WITH PROPOFOL (N/A)  Patient Location: PACU  Anesthesia Type: MAC  Level of Consciousness: awake and alert   Airway and Oxygen Therapy: Patient Spontanous Breathing  Post-op Pain: mild  Post-op Assessment: Post-op Vital signs reviewed, Patient's Cardiovascular Status Stable, Respiratory Function Stable, Patent Airway and No signs of Nausea or vomiting  Last Vitals:  Filed Vitals:   03/06/14 1057  BP: 118/58  Pulse: 59  Temp:   Resp: 17    Post-op Vital Signs: stable   Complications: No apparent anesthesia complications

## 2014-03-06 NOTE — Op Note (Signed)
[facility] [place], [address]   COLONOSCOPY PROCEDURE REPORT  PATIENT: Oscar [name], Oscar [name]  MR#: [id number] BIRTHDATE: 1966-04-09 , 51  yrs. old GENDER: male ENDOSCOPIST: Arta Silence, MD REFERRED CM:[id number] [name], M.D. PROCEDURE DATE:  04-02-2014 PROCEDURE:   Colonoscopy with snare polypectomy, hemoclip application ASA CLASS:   Class III INDICATIONS:colon polyps.Marland Kitchen MEDICATIONS: Monitored anesthesia care  DESCRIPTION OF PROCEDURE:   After the risks benefits and alternatives of the procedure were thoroughly explained, informed consent was obtained.  revealed no abnormalities of the rectum. The pediatric colonoscope was introduced through the anus and advanced to the cecum, which was identified by both the appendix and ileocecal valve. No adverse events experienced.   The quality of the prep was fair.  The instrument was then slowly withdrawn as the colon was fully examined.    Findings:  Normal digital rectal exam.  Prep quality diffusely fair; diminutive or subtle sessile polyps could have been missed.  82mm ascending colon polyp, removed with hot snare.  3mm transverse polyp, removed with hot snare.  39mm pedunculated descending colon polyp, removed with hot snare; after removal of this polyp, there was some persistent bloody oozing from the polypectomy stalk; two hemoclips were placed over the stalk and hemostasis was achieved. No other polyps, mass, vascular ectasias, or inflammatory changes were seen.  No diverticula seen.  Retroflexion into rectum showed rectal varices, but was otherwise normal.       Withdrawal time was 27 minuts.     .  The scope was withdrawn and the procedure completed.  COMPLICATIONS: Post-polypectomy bleeding, hemostasis achieved after placement of clips.  ENDOSCOPIC IMPRESSION:     As above.  Three further polyps removed.  RECOMMENDATIONS:     1.  Watch for potential complications of procedure. 2.   Await polypectomy results. 3.  Pending state of patient's cirrhosis, would typically consider repeat colonoscopy in 3-5 years. 4.  Follow-up with [name] in 3 months for ongoing management of cirrhosis.  eSigned:  Arta Silence, MD 04-02-2014 10:51 AM   cc:  CPT CODES: ICD CODES:  The ICD and CPT codes recommended by this software are interpretations from the data that the clinical staff has captured with the software.  The verification of the translation of this report to the ICD and CPT codes and modifiers is the sole responsibility of the health care institution and practicing physician where this report was generated.  [employer] will not be held responsible for the validity of the ICD and CPT codes included on this report.  AMA assumes no liability for data contained or not contained herein. CPT is a Designer, television/film set of the [employer].

## 2014-03-07 ENCOUNTER — Encounter (HOSPITAL_COMMUNITY): Payer: Self-pay | Admitting: Gastroenterology

## 2014-03-10 ENCOUNTER — Other Ambulatory Visit: Payer: Self-pay | Admitting: Internal Medicine

## 2014-03-11 ENCOUNTER — Other Ambulatory Visit: Payer: Self-pay

## 2014-05-03 ENCOUNTER — Other Ambulatory Visit: Payer: Self-pay | Admitting: *Deleted

## 2014-05-03 MED ORDER — HYDROCHLOROTHIAZIDE 25 MG PO TABS: 25.0000 mg | ORAL_TABLET | Freq: Every morning | ORAL | Status: DC

## 2014-05-03 NOTE — Telephone Encounter (Signed)
Patient wife states patient dropped over half his HCTZ down the sink drain on accident she is requesting we send refill to [facility] as self pay for 90 day supply so he doesn't run out of medication. Refill Ok per Dr Renold Genta

## 2014-05-09 ENCOUNTER — Encounter (HOSPITAL_COMMUNITY): Payer: Self-pay | Admitting: Gastroenterology

## 2014-05-23 ENCOUNTER — Other Ambulatory Visit: Payer: Self-pay | Admitting: *Deleted

## 2014-05-23 DIAGNOSIS — D693 Immune thrombocytopenic purpura: Secondary | ICD-10-CM

## 2014-05-24 ENCOUNTER — Ambulatory Visit (HOSPITAL_BASED_OUTPATIENT_CLINIC_OR_DEPARTMENT_OTHER): Payer: BLUE CROSS/BLUE SHIELD | Admitting: Hematology & Oncology

## 2014-05-24 ENCOUNTER — Encounter: Payer: Self-pay | Admitting: Hematology & Oncology

## 2014-05-24 ENCOUNTER — Other Ambulatory Visit (HOSPITAL_BASED_OUTPATIENT_CLINIC_OR_DEPARTMENT_OTHER): Payer: BLUE CROSS/BLUE SHIELD | Admitting: Lab

## 2014-05-24 DIAGNOSIS — D693 Immune thrombocytopenic purpura: Secondary | ICD-10-CM

## 2014-05-24 LAB — IRON AND TIBC
%SAT: 22 % (ref 20–55)
IRON: 107 ug/dL (ref 42–165)
TIBC: 497 ug/dL — ABNORMAL HIGH (ref 215–435)
UIBC: 390 ug/dL (ref 125–400)

## 2014-05-24 LAB — CBC WITH DIFFERENTIAL (CANCER CENTER ONLY)
BASO#: 0 10*3/uL (ref 0.0–0.2)
BASO%: 0.3 % (ref 0.0–2.0)
EOS ABS: 0.1 10*3/uL (ref 0.0–0.5)
EOS%: 2.8 % (ref 0.0–7.0)
HCT: 37 % — ABNORMAL LOW (ref 38.7–49.9)
HEMOGLOBIN: 12.8 g/dL — AB (ref 13.0–17.1)
LYMPH#: 0.9 10*3/uL (ref 0.9–3.3)
LYMPH%: 28.3 % (ref 14.0–48.0)
MCH: 29.3 pg (ref 28.0–33.4)
MCHC: 34.6 g/dL (ref 32.0–35.9)
MCV: 85 fL (ref 82–98)
MONO#: 0.3 10*3/uL (ref 0.1–0.9)
MONO%: 10.6 % (ref 0.0–13.0)
NEUT#: 1.9 10*3/uL (ref 1.5–6.5)
NEUT%: 58 % (ref 40.0–80.0)
Platelets: 76 10*3/uL — ABNORMAL LOW (ref 145–400)
RBC: 4.37 10*6/uL (ref 4.20–5.70)
RDW: 14.4 % (ref 11.1–15.7)
WBC: 3.2 10*3/uL — ABNORMAL LOW (ref 4.0–10.0)

## 2014-05-24 LAB — CHCC SATELLITE - SMEAR

## 2014-05-24 LAB — FERRITIN: Ferritin: 15 ng/mL — ABNORMAL LOW (ref 22–322)

## 2014-05-24 NOTE — Progress Notes (Signed)
Oscar [name]  [phone] Fax:(336) [phone]  ID: Oscar [name] OB: Nov 19, 1965 MR#: [id number] FKC#:[id number] Patient Care Team: Elby Showers, MD as PCP - General (Internal Medicine)  DIAGNOSIS: Chronic immune thrombocytopenia  Cirrhosis  Leukopenia Anemia secondary to intermittent GI bleeding  INTERVAL HISTORY: Oscar [name] is here today for a follow-up. He is doing well and has had no episodes of bleeding.   No swelling, tenderness, numbness or tingling in his extremities.  He denies fever, chills, n/v, cough, rash, headache, dizziness, SOB, chest pain, palpitations, constipation, diarrhea, blood in urine or stool .  His appetite is good and her is drinking plenty of fluids.  He was given Nplate before his endoscopy and colonoscopy and did well with the procedure.  He is still having some abdominal pain and is scheduled for an abdominal ultrasound in February.   CURRENT TREATMENT: Nplate as needed for any procedure IV iron as indicated  REVIEW OF SYSTEMS: All other 10 point review of systems is negative.   PAST MEDICAL HISTORY: Past Medical History  Diagnosis Date  . Fatty liver   . Obesity   . ITP (idiopathic thrombocytopenic purpura) 05/16/2013  . History of colon polyps   . Hypertension     takes Atenolol,HCTZ,and Lisinopril daily  . Gastric varices     takes Propranlol for this  . Numbness hands and fingers    and tingling  . Neck pain     HNP and radiculpathy  . GERD (gastroesophageal reflux disease)     takes Nexium daily  . GI bleed june 2015  . Esophageal varices   . Urinary frequency   . Constipation     related to pain meds and takes Colace daily  . Insomnia     but doesn't take any meds  . History of staph infection 2014  . Sleep apnea     sleep study in epic from 2006 but doesn't use a cpap (10/18/2013)  . Type II diabetes mellitus     takes Novolin and Novolog and takes Janumet daily  . VCBSWHQP(591.6)     "weekly"  (10/18/2013)  . Osteoarthritis     "hands; elbows; knees" (10/18/2013)  . Hematemesis 10/18/2013    hospitalized  . Hepatitis yrs ago    hx of Hep A  . Pancytopenia     but never had a blood transfusion, had to take iron transfusion  sept 2014   PAST SURGICAL HISTORY: Past Surgical History  Procedure Laterality Date  . Esophagogastroduodenoscopy (egd) with propofol N/A 02/07/2013    Procedure: ESOPHAGOGASTRODUODENOSCOPY (EGD) WITH PROPOFOL;  Surgeon: Arta Silence, MD;  Location: [facility];  Service: Endoscopy;  Laterality: N/A;  . Colonoscopy with propofol N/A 02/07/2013    Procedure: COLONOSCOPY WITH PROPOFOL;  Surgeon: Arta Silence, MD;  Location: [facility];  Service: Endoscopy;  Laterality: N/A;  . Incision and drainage mouth  02/2013    " jaw area"  . Wisdom tooth extraction  ~  1997  . Anterior cervical decomp/discectomy fusion N/A 05/30/2013    Procedure: Cervical five-six anterior cervical decompression with interbody prosthesis plating and bonegraft;  Surgeon: Ophelia Charter, MD;  Location: [facility];  Service: Neurosurgery;  Laterality: N/A;  Cervical five-six anterior cervical decompression with interbody prosthesis plating and bonegraft  . Esophagogastroduodenoscopy N/A 10/19/2013    Procedure: ESOPHAGOGASTRODUODENOSCOPY (EGD);  Surgeon: Jeryl Columbia, MD;  Location: [facility]OSCOPY;  Service: Endoscopy;  Laterality: N/A;  . Esophagogastroduodenoscopy (egd) with propofol N/A 03/06/2014  Procedure: ESOPHAGOGASTRODUODENOSCOPY (EGD) WITH PROPOFOL;  Surgeon: Arta Silence, MD;  Location: [facility];  Service: Endoscopy;  Laterality: N/A;  . Eus N/A 03/06/2014    Procedure: ESOPHAGEAL ENDOSCOPIC ULTRASOUND (EUS) RADIAL;  Surgeon: Arta Silence, MD;  Location: [facility];  Service: Endoscopy;  Laterality: N/A;  . Colonoscopy with propofol N/A 03/06/2014    Procedure: COLONOSCOPY WITH PROPOFOL;  Surgeon: Arta Silence, MD;  Location: [facility];  Service: Endoscopy;   Laterality: N/A;   FAMILY HISTORY No family history on file.  GYNECOLOGIC HISTORY:  No LMP for male patient.   SOCIAL HISTORY:  History   Social History  . Marital Status: Married    Spouse Name: N/A    Number of Children: N/A  . Years of Education: N/A   Occupational History  . Not on file.   Social History Main Topics  . Smoking status: Never Smoker   . Smokeless tobacco: Never Used     Comment: never used tobacco  . Alcohol Use: 0.0 oz/week    0 Not specified per week     Comment: quit drinking compeletely in Oct 2014  . Drug Use: No  . Sexual Activity: Not Currently   Other Topics Concern  . Not on file   Social History Narrative   ADVANCED DIRECTIVES: <no information>  HEALTH MAINTENANCE: History  Substance Use Topics  . Smoking status: Never Smoker   . Smokeless tobacco: Never Used     Comment: never used tobacco  . Alcohol Use: 0.0 oz/week    0 Not specified per week     Comment: quit drinking compeletely in Oct 2014   Colonoscopy: PAP: Bone density: Lipid panel:  Allergies  Allergen Reactions  . Byetta 10 Mcg Pen [Exenatide] Nausea And Vomiting    Current Outpatient Prescriptions  Medication Sig Dispense Refill  . atenolol (TENORMIN) 50 MG tablet TAKE 1 TABLET DAILY 90 tablet 0  . docusate sodium (COLACE) 100 MG capsule Take 100 mg by mouth daily as needed for mild constipation.     Marland Kitchen esomeprazole (NEXIUM) 40 MG capsule Take 1 capsule (40 mg total) by mouth 2 (two) times daily before a meal. 60 capsule 2  . hydrochlorothiazide (HYDRODIURIL) 25 MG tablet Take 1 tablet (25 mg total) by mouth every morning. 90 tablet 0  . HYDROcodone-acetaminophen (NORCO) 10-325 MG per tablet Take 0.5-1 tablets by mouth every 6 (six) hours as needed for moderate pain.    Marland Kitchen insulin NPH-regular Human (NOVOLIN 70/30) (70-30) 100 UNIT/ML injection Inject 10 Units into the skin at bedtime.    Marland Kitchen lisinopril (PRINIVIL,ZESTRIL) 20 MG tablet Take 20 mg by mouth every  morning.    Marland Kitchen NOVOLOG FLEXPEN 100 UNIT/ML SOPN FlexPen Inject 5-10 Units into the skin 3 (three) times daily.     . propranolol (INDERAL) 10 MG tablet Take 10 mg by mouth 2 (two) times daily.    . sitaGLIPtin-metformin (JANUMET) 50-1000 MG per tablet Take 1 tablet by mouth 2 (two) times daily with a meal. 180 tablet 1  . sodium chloride (OCEAN) 0.65 % SOLN nasal spray Place 2 sprays into both nostrils daily as needed for congestion.    . diphenhydrAMINE (BENADRYL) 25 mg capsule Take 25 mg by mouth every 6 (six) hours as needed for itching.     No current facility-administered medications for this visit.   OBJECTIVE: Filed Vitals:   05/24/14 1552  BP: 117/66  Pulse: 90  Temp: 98 F (36.7 C)  Resp: 18   Body mass index  is 41.52 kg/(m^2). ECOG FS:0 - Asymptomatic Ocular: Sclerae unicteric, pupils equal, round and reactive to light Ear-nose-throat: Oropharynx clear, dentition fair Lymphatic: No cervical or supraclavicular adenopathy Lungs no rales or rhonchi, good excursion bilaterally Heart regular rate and rhythm, no murmur appreciated Abd soft, nontender, positive bowel sounds MSK no focal spinal tenderness, no joint edema Neuro: non-focal, well-oriented, appropriate affect  LAB RESULTS: CMP     Component Value Date/Time   NA 136 03/01/2014 1544   NA 135 12/28/2012 0809   K 3.9 03/01/2014 1544   K 4.4 12/28/2012 0809   CL 102 03/01/2014 1544   CL 98 12/28/2012 0809   CO2 23 03/01/2014 1544   CO2 26 12/28/2012 0809   GLUCOSE 202* 03/01/2014 1544   GLUCOSE 273* 12/28/2012 0809   BUN 10 03/01/2014 1544   BUN 8 12/28/2012 0809   CREATININE 0.87 03/01/2014 1544   CREATININE 1.50* 02/27/2013 1507   CALCIUM 9.4 03/01/2014 1544   CALCIUM 9.1 12/28/2012 0809   PROT 7.1 03/01/2014 1544   PROT 8.2* 12/28/2012 0809   ALBUMIN 3.6 03/01/2014 1544   AST 25 03/01/2014 1544   AST 119* 12/28/2012 0809   ALT 21 03/01/2014 1544   ALT 62* 12/28/2012 0809   ALKPHOS 102 03/01/2014 1544    ALKPHOS 124* 12/28/2012 0809   BILITOT 1.6* 03/01/2014 1544   BILITOT 1.70* 12/28/2012 0809   GFRNONAA >90 10/19/2013 0250   GFRAA >90 10/19/2013 0250   No results found for: SPEP Lab Results  Component Value Date   WBC 3.2* 05/24/2014   NEUTROABS 1.9 05/24/2014   HGB 12.8* 05/24/2014   HCT 37.0* 05/24/2014   MCV 85 05/24/2014   PLT 76* 05/24/2014   No results found for: LABCA2 No components found for: MHDQQ229 No results for input(s): INR in the last 168 hours.  STUDIES: No results found.  ASSESSMENT/PLAN: Mr. Lege is very pleasant 49 year old male with chronic immune thrombocytopenia. He has cirrhosis that is NASH related.  His Hgb is 12.8, platelets today are 76 and he has had no bleeding. He has no invasive procedures planned so he does not need Nplate at this time.  We will see him back in 3 months for labs and follow-up. He is in agreement with this and knows to call here with any questions or concerns. We can certainly see him back here sooner if need be.  Eliezer Bottom, NP 05/24/2014 4:27 PM

## 2014-05-25 LAB — COMPREHENSIVE METABOLIC PANEL
ALT: 19 U/L (ref 0–53)
AST: 22 U/L (ref 0–37)
Albumin: 3.2 g/dL — ABNORMAL LOW (ref 3.5–5.2)
Alkaline Phosphatase: 105 U/L (ref 39–117)
BUN: 15 mg/dL (ref 6–23)
CO2: 25 mEq/L (ref 19–32)
Calcium: 8.9 mg/dL (ref 8.4–10.5)
Chloride: 99 mEq/L (ref 96–112)
Creatinine, Ser: 0.91 mg/dL (ref 0.50–1.35)
Glucose, Bld: 235 mg/dL — ABNORMAL HIGH (ref 70–99)
Potassium: 3.9 mEq/L (ref 3.5–5.3)
Sodium: 134 mEq/L — ABNORMAL LOW (ref 135–145)
Total Bilirubin: 1.5 mg/dL — ABNORMAL HIGH (ref 0.2–1.2)
Total Protein: 6.1 g/dL (ref 6.0–8.3)

## 2014-05-25 LAB — RETICULOCYTES (CHCC)
ABS Retic: 69.9 10*3/uL (ref 19.0–186.0)
RBC.: 4.37 MIL/uL (ref 4.22–5.81)
Retic Ct Pct: 1.6 % (ref 0.4–2.3)

## 2014-05-25 LAB — PROTHROMBIN TIME
INR: 1.32 (ref <1.50)
Prothrombin Time: 16.4 seconds — ABNORMAL HIGH (ref 11.6–15.2)

## 2014-05-25 LAB — APTT: aPTT: 37 seconds (ref 24–37)

## 2014-06-03 ENCOUNTER — Other Ambulatory Visit: Payer: Self-pay | Admitting: Family

## 2014-06-24 ENCOUNTER — Other Ambulatory Visit: Payer: Self-pay | Admitting: Internal Medicine

## 2014-06-24 NOTE — Telephone Encounter (Signed)
Refills sent on BP meds patient needs office visit before anymore refills. Left message for patient

## 2014-06-24 NOTE — Telephone Encounter (Signed)
Needs appt in next 2 months

## 2014-07-02 ENCOUNTER — Other Ambulatory Visit: Payer: Self-pay | Admitting: Gastroenterology

## 2014-07-02 DIAGNOSIS — K7030 Alcoholic cirrhosis of liver without ascites: Secondary | ICD-10-CM

## 2014-07-04 ENCOUNTER — Other Ambulatory Visit: Payer: Self-pay | Admitting: Internal Medicine

## 2014-07-09 ENCOUNTER — Ambulatory Visit
Admission: RE | Admit: 2014-07-09 | Discharge: 2014-07-09 | Disposition: A | Discharge status: Home / Self Care | Payer: BLUE CROSS/BLUE SHIELD | Source: Ambulatory Visit | Attending: Gastroenterology | Admitting: Gastroenterology

## 2014-07-09 DIAGNOSIS — K7030 Alcoholic cirrhosis of liver without ascites: Secondary | ICD-10-CM

## 2014-07-11 ENCOUNTER — Encounter: Payer: Self-pay | Admitting: Internal Medicine

## 2014-08-23 ENCOUNTER — Ambulatory Visit: Payer: BLUE CROSS/BLUE SHIELD | Admitting: Hematology & Oncology

## 2014-08-23 ENCOUNTER — Other Ambulatory Visit: Payer: BLUE CROSS/BLUE SHIELD

## 2014-08-29 ENCOUNTER — Other Ambulatory Visit (HOSPITAL_COMMUNITY): Payer: Self-pay | Admitting: Gastroenterology

## 2014-08-29 DIAGNOSIS — R1011 Right upper quadrant pain: Secondary | ICD-10-CM

## 2014-09-03 ENCOUNTER — Other Ambulatory Visit: Payer: BLUE CROSS/BLUE SHIELD | Admitting: Internal Medicine

## 2014-09-03 DIAGNOSIS — E119 Type 2 diabetes mellitus without complications: Secondary | ICD-10-CM

## 2014-09-03 DIAGNOSIS — Z13220 Encounter for screening for lipoid disorders: Secondary | ICD-10-CM

## 2014-09-03 DIAGNOSIS — Z0000 Encounter for general adult medical examination without abnormal findings: Secondary | ICD-10-CM

## 2014-09-03 LAB — LIPID PANEL
Cholesterol: 145 mg/dL (ref 0–200)
HDL: 51 mg/dL (ref >=40)
LDL Cholesterol: 79 mg/dL (ref 0–99)
Total CHOL/HDL Ratio: 2.8 Ratio
Triglycerides: 76 mg/dL (ref <150)
VLDL: 15 mg/dL (ref 0–40)

## 2014-09-03 LAB — CBC WITH DIFFERENTIAL/PLATELET
BASOS ABS: 0 10*3/uL (ref 0.0–0.1)
BASOS PCT: 0 % (ref 0–1)
EOS ABS: 0.1 10*3/uL (ref 0.0–0.7)
Eosinophils Relative: 2 % (ref 0–5)
HEMATOCRIT: 41.1 % (ref 39.0–52.0)
HEMOGLOBIN: 13.8 g/dL (ref 13.0–17.0)
Lymphocytes Relative: 34 % (ref 12–46)
Lymphs Abs: 1 10*3/uL (ref 0.7–4.0)
MCH: 29.4 pg (ref 26.0–34.0)
MCHC: 33.6 g/dL (ref 30.0–36.0)
MCV: 87.4 fL (ref 78.0–100.0)
MPV: 9.8 fL (ref 8.6–12.4)
Monocytes Absolute: 0.2 10*3/uL (ref 0.1–1.0)
Monocytes Relative: 7 % (ref 3–12)
NEUTROS PCT: 57 % (ref 43–77)
Neutro Abs: 1.7 10*3/uL (ref 1.7–7.7)
Platelets: 82 10*3/uL — ABNORMAL LOW (ref 150–400)
RBC: 4.7 MIL/uL (ref 4.22–5.81)
RDW: 16.7 % — AB (ref 11.5–15.5)
WBC: 3 10*3/uL — ABNORMAL LOW (ref 4.0–10.5)

## 2014-09-03 LAB — COMPLETE METABOLIC PANEL WITH GFR
ALK PHOS: 113 U/L (ref 39–117)
ALT: 18 U/L (ref 0–53)
AST: 20 U/L (ref 0–37)
Albumin: 3.5 g/dL (ref 3.5–5.2)
BILIRUBIN TOTAL: 1.5 mg/dL — AB (ref 0.2–1.2)
BUN: 12 mg/dL (ref 6–23)
CO2: 25 meq/L (ref 19–32)
Calcium: 9.2 mg/dL (ref 8.4–10.5)
Chloride: 103 mEq/L (ref 96–112)
Creat: 0.76 mg/dL (ref 0.50–1.35)
GFR, Est Non African American: >89 mL/min
Glucose, Bld: 236 mg/dL — ABNORMAL HIGH (ref 70–99)
Potassium: 4.7 mEq/L (ref 3.5–5.3)
SODIUM: 136 meq/L (ref 135–145)
TOTAL PROTEIN: 6.8 g/dL (ref 6.0–8.3)

## 2014-09-03 LAB — HEMOGLOBIN A1C
Hgb A1c MFr Bld: 10.5 % — ABNORMAL HIGH (ref <5.7)
Mean Plasma Glucose: 255 mg/dL — ABNORMAL HIGH (ref <117)

## 2014-09-10 ENCOUNTER — Ambulatory Visit (INDEPENDENT_AMBULATORY_CARE_PROVIDER_SITE_OTHER): Payer: BLUE CROSS/BLUE SHIELD | Admitting: Internal Medicine

## 2014-09-10 ENCOUNTER — Encounter: Payer: Self-pay | Admitting: Internal Medicine

## 2014-09-10 VITALS — BP 118/72 | HR 81 | Temp 98.7°F | Ht 68.0 in | Wt 273.0 lb

## 2014-09-10 DIAGNOSIS — I10 Essential (primary) hypertension: Secondary | ICD-10-CM

## 2014-09-10 DIAGNOSIS — Z23 Encounter for immunization: Secondary | ICD-10-CM

## 2014-09-10 DIAGNOSIS — I8500 Esophageal varices without bleeding: Secondary | ICD-10-CM

## 2014-09-10 DIAGNOSIS — Z86010 Personal history of colonic polyps: Secondary | ICD-10-CM

## 2014-09-10 DIAGNOSIS — Z0000 Encounter for general adult medical examination without abnormal findings: Secondary | ICD-10-CM | POA: Diagnosis not present

## 2014-09-10 DIAGNOSIS — D72819 Decreased white blood cell count, unspecified: Secondary | ICD-10-CM

## 2014-09-10 DIAGNOSIS — E669 Obesity, unspecified: Secondary | ICD-10-CM

## 2014-09-10 DIAGNOSIS — K219 Gastro-esophageal reflux disease without esophagitis: Secondary | ICD-10-CM

## 2014-09-10 DIAGNOSIS — E119 Type 2 diabetes mellitus without complications: Secondary | ICD-10-CM | POA: Diagnosis not present

## 2014-09-10 DIAGNOSIS — IMO0001 Reserved for inherently not codable concepts without codable children: Secondary | ICD-10-CM

## 2014-09-10 DIAGNOSIS — K7030 Alcoholic cirrhosis of liver without ascites: Secondary | ICD-10-CM

## 2014-09-10 DIAGNOSIS — D696 Thrombocytopenia, unspecified: Secondary | ICD-10-CM

## 2014-09-10 DIAGNOSIS — Z794 Long term (current) use of insulin: Secondary | ICD-10-CM

## 2014-09-10 DIAGNOSIS — G4733 Obstructive sleep apnea (adult) (pediatric): Secondary | ICD-10-CM

## 2014-09-10 LAB — POCT URINALYSIS DIPSTICK
Bilirubin, UA: NEGATIVE
Blood, UA: NEGATIVE
Ketones, UA: NEGATIVE
Leukocytes, UA: NEGATIVE
Nitrite, UA: NEGATIVE
Protein, UA: NEGATIVE
Spec Grav, UA: 1.015
Urobilinogen, UA: NEGATIVE
pH, UA: 6

## 2014-09-10 MED ORDER — HYDROCHLOROTHIAZIDE 25 MG PO TABS: 25.0000 mg | ORAL_TABLET | Freq: Every morning | ORAL | Status: DC

## 2014-09-10 MED ORDER — ATENOLOL 50 MG PO TABS: 50.0000 mg | ORAL_TABLET | Freq: Every day | ORAL | Status: DC

## 2014-09-10 MED ORDER — LISINOPRIL 20 MG PO TABS: 20.0000 mg | ORAL_TABLET | ORAL | Status: DC

## 2014-09-10 NOTE — Progress Notes (Signed)
Subjective:    Patient ID: Oscar [name], male    DOB: 07/09/1965, 49 y.o.   MRN: [id number]  HPI  49 year old Male for health maintenance and evaluation with medical issues. Last seen in office November 2014. He is being followed by Dr. Jonette Eva for thrombocytopenia. Recent platelet count was 82,000. He also has leukopenia with recent white blood cell count 3000. Hemoglobin is 13.8 g. Differential is normal. 6 months ago his platelet count was 162,000. He has a history of diabetes mellitus insulin-dependent. Hemoglobin A1c is 10.5%. Fasting glucose is 236. BUN and creatinine are normal. 2 years ago hemoglobin A1c was 11.2%. Liver functions are normal. Estimated GFR is normal.  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  History of GE reflux, esophageal varices, hyperlipidemia, hypertension, obesity, fatty liver, multiple admissions for GI bleeding  Social history: He is married. Says he no longer drinks alcohol. Does not use illicit drugs. Nonsmoker.  Family history: Father died from complications of surgery for kidney cancer. Mother with history of hypertension. 9 siblings. No children.  He has had pneumococcal 23 vaccine. Had flu vaccine October 2015. Tetanus immunization 2011. Needs Prevnar.  Diabetic regimen consists of Novolin N and Janumet XR  50/1000 2 tablets by mouth every evening with food.  Blood pressure regimen consist of atenolol and HCTZ as well as lisinopril. For GE reflux he takes Nexium.  Last endoscopy by Dr. Limmie Patricia in 2015 showed distal esophageal varices which were stable.  Had colonoscopy by Dr. Paulita Fujita 2015 showing hyperplastic polyps and tubular adenomas  In 2015 he had a C5-C6 anterior dissectomy by Dr. Arnoldo Morale  History of sleep apnea treated with Cipro prescribed prescriber Dr. Annamaria Boots in 2006  Intolerant of Byetta- causes nausea and vomiting.  Dr. Bing Plume is physician.  No known drug  allergies      Review of Systems  Constitutional: Positive for fatigue.  Respiratory: Negative.   Cardiovascular: Negative for chest pain and palpitations.  Gastrointestinal:       History of GE reflux, esophageal varices, history of elevated liver functions since 2001. Hepatitis studies 2005 showed negative hepatitis C antibody, negative hepatitis B surface antibody, negative hepatitis B core antibody, negative hepatitis B surface antigen. ANA was negative.  Endocrine:       Long-standing history of type 2 diabetes since 2006. History of hyperlipidemia since 2003  Genitourinary: Negative.   Neurological: Negative.   Hematological:       Leukopenia and thrombocytopenia  Psychiatric/Behavioral: Negative.        Objective:   Physical Exam  Constitutional: He is oriented to person, place, and time. He appears well-developed and well-nourished. No distress.  HENT:  Head: Normocephalic and atraumatic.  Right Ear: External ear normal.  Left Ear: External ear normal.  Mouth/Throat: Oropharynx is clear and moist. No oropharyngeal exudate.  Eyes: Conjunctivae and EOM are normal. Pupils are equal, round, and reactive to light. Right eye exhibits no discharge. No scleral icterus.  Neck: Neck supple. No JVD present. No thyromegaly present.  Cardiovascular: Normal rate, regular rhythm, normal heart sounds and intact distal pulses.   No murmur heard. Pulmonary/Chest: Effort normal and breath sounds normal. No respiratory distress. He has no wheezes. He has no rales.  Abdominal: Soft. Bowel sounds are normal. He exhibits no distension and no mass. There is no tenderness. There is no rebound and no guarding.  Musculoskeletal: He exhibits no edema.  Neurological: He  is alert and oriented to person, place, and time. He has normal reflexes.  Skin: Skin is warm and dry. No rash noted. He is not diaphoretic.  Psychiatric: He has a normal mood and affect. His behavior is normal. Judgment and thought  content normal.  Vitals reviewed.         Assessment & Plan:  Type 2 diabetes mellitus  Essential hypertension  Leukopenia-likely secondary to chronic disease  Thrombocytopenia-followed by hematologist  History of fatty liver  Esophageal varices-likely due to alcoholic liver disease with history of recurrent GI bleeding  Adenomatous colon polyps  Obesity  Sleep apnea  GE reflux  History of hyperlipidemia  Metabolic syndrome  Plan: Continue same medications and return in 6 months. Continue follow-up for thrombocytopenia which is significant by Dr. Marin Olp

## 2014-09-11 LAB — MICROALBUMIN / CREATININE URINE RATIO
CREATININE, URINE: 28.9 mg/dL
Microalb, Ur: <0.2 mg/dL (ref <2.0)

## 2014-09-13 ENCOUNTER — Ambulatory Visit (HOSPITAL_COMMUNITY): Payer: BLUE CROSS/BLUE SHIELD

## 2014-09-16 ENCOUNTER — Ambulatory Visit (HOSPITAL_COMMUNITY)
Admission: RE | Admit: 2014-09-16 | Discharge: 2014-09-16 | Disposition: A | Discharge status: Home / Self Care | Payer: BLUE CROSS/BLUE SHIELD | Source: Ambulatory Visit | Attending: Gastroenterology | Admitting: Gastroenterology

## 2014-09-16 DIAGNOSIS — R1011 Right upper quadrant pain: Secondary | ICD-10-CM | POA: Insufficient documentation

## 2014-09-16 MED ORDER — SINCALIDE 5 MCG IJ SOLR: 0.0200 ug/kg | Freq: Once | INTRAMUSCULAR | Status: DC

## 2014-09-16 MED ORDER — TECHNETIUM TC 99M MEBROFENIN IV KIT
4.9000 | PACK | Freq: Once | INTRAVENOUS | Status: AC | PRN
  Administered 2014-09-16: 5 via INTRAVENOUS

## 2014-09-20 ENCOUNTER — Other Ambulatory Visit (HOSPITAL_BASED_OUTPATIENT_CLINIC_OR_DEPARTMENT_OTHER): Payer: BLUE CROSS/BLUE SHIELD

## 2014-09-20 ENCOUNTER — Ambulatory Visit (HOSPITAL_BASED_OUTPATIENT_CLINIC_OR_DEPARTMENT_OTHER): Payer: BLUE CROSS/BLUE SHIELD | Admitting: Hematology & Oncology

## 2014-09-20 ENCOUNTER — Encounter: Payer: Self-pay | Admitting: Hematology & Oncology

## 2014-09-20 VITALS — BP 114/57 | HR 67 | Temp 98.2°F | Resp 18 | Wt 271.0 lb

## 2014-09-20 DIAGNOSIS — K7460 Unspecified cirrhosis of liver: Secondary | ICD-10-CM | POA: Diagnosis not present

## 2014-09-20 DIAGNOSIS — D693 Immune thrombocytopenic purpura: Secondary | ICD-10-CM | POA: Diagnosis not present

## 2014-09-20 DIAGNOSIS — K7581 Nonalcoholic steatohepatitis (NASH): Secondary | ICD-10-CM | POA: Diagnosis not present

## 2014-09-20 LAB — CBC WITH DIFFERENTIAL (CANCER CENTER ONLY)
BASO#: 0 10*3/uL (ref 0.0–0.2)
BASO%: 0.4 % (ref 0.0–2.0)
EOS%: 2.6 % (ref 0.0–7.0)
Eosinophils Absolute: 0.1 10*3/uL (ref 0.0–0.5)
HCT: 37.9 % — ABNORMAL LOW (ref 38.7–49.9)
HGB: 13.6 g/dL (ref 13.0–17.1)
LYMPH#: 1.1 10*3/uL (ref 0.9–3.3)
LYMPH%: 39.6 % (ref 14.0–48.0)
MCH: 30.2 pg (ref 28.0–33.4)
MCHC: 35.9 g/dL (ref 32.0–35.9)
MCV: 84 fL (ref 82–98)
MONO#: 0.2 10*3/uL (ref 0.1–0.9)
MONO%: 5.9 % (ref 0.0–13.0)
NEUT%: 51.5 % (ref 40.0–80.0)
NEUTROS ABS: 1.4 10*3/uL — AB (ref 1.5–6.5)
PLATELETS: 83 10*3/uL — AB (ref 145–400)
RBC: 4.51 10*6/uL (ref 4.20–5.70)
RDW: 14.3 % (ref 11.1–15.7)
WBC: 2.7 10*3/uL — ABNORMAL LOW (ref 4.0–10.0)

## 2014-09-20 LAB — FERRITIN CHCC: FERRITIN: 14 ng/mL — AB (ref 22–316)

## 2014-09-20 LAB — COMPREHENSIVE METABOLIC PANEL
ALK PHOS: 116 U/L (ref 39–117)
ALT: 22 U/L (ref 0–53)
AST: 24 U/L (ref 0–37)
Albumin: 3.6 g/dL (ref 3.5–5.2)
BUN: 12 mg/dL (ref 6–23)
CALCIUM: 8.9 mg/dL (ref 8.4–10.5)
CO2: 20 mEq/L (ref 19–32)
Chloride: 103 mEq/L (ref 96–112)
Creatinine, Ser: 0.7 mg/dL (ref 0.50–1.35)
Glucose, Bld: 252 mg/dL — ABNORMAL HIGH (ref 70–99)
POTASSIUM: 3.5 meq/L (ref 3.5–5.3)
Sodium: 133 mEq/L — ABNORMAL LOW (ref 135–145)
TOTAL PROTEIN: 7 g/dL (ref 6.0–8.3)
Total Bilirubin: 1.5 mg/dL — ABNORMAL HIGH (ref 0.2–1.2)

## 2014-09-20 LAB — RETICULOCYTES (CHCC)
ABS Retic: 69.5 10*3/uL (ref 19.0–186.0)
RBC.: 4.63 MIL/uL (ref 4.22–5.81)
RETIC CT PCT: 1.5 % (ref 0.4–2.3)

## 2014-09-20 LAB — PROTIME-INR (CHCC SATELLITE)
INR: 1.2 — ABNORMAL LOW (ref 2.0–3.5)
Protime: 14.4 Seconds — ABNORMAL HIGH (ref 10.6–13.4)

## 2014-09-20 LAB — IRON AND TIBC CHCC
%SAT: 16 % — ABNORMAL LOW (ref 20–55)
Iron: 84 ug/dL (ref 42–163)
TIBC: 521 ug/dL — ABNORMAL HIGH (ref 202–409)
UIBC: 437 ug/dL — ABNORMAL HIGH (ref 117–376)

## 2014-09-20 LAB — APTT: aPTT: 32 seconds (ref 24–37)

## 2014-09-20 LAB — CHCC SATELLITE - SMEAR

## 2014-09-20 NOTE — Progress Notes (Signed)
Hematology and Oncology Follow Up Visit  Oscar [name] [id number] June 23, 1965 49 y.o. 09/20/2014   Principle Diagnosis:   Chronic immune thrombocytopenia  Cirrhosis  Leukopenia  Anemia secondary to intermittent GI bleeding  Current Therapy:    Nplate as needed for any procedure  IV iron as indicated     Interim History:  Mr.  Oscar [name] is back for followup. He is doing quite well. He and his wife will be moving to a new house this summer. They're looking for to this. Apparently had his colonoscopy back in November area and he had an upper endoscopy also. Thank you, this did not show any malignancy. He has varices. He had 3 polyps in the colon. Biopsies showed polyps to be tubular adenomas without any dysplasia.  He's gotten over his cervical spine surgery. This was a little over a year ago. He had no problems with leaking with this. He is had no issues with radicular pain.  He is not drinking. He is held off alcohol now for over a year.  There's been no change in bowel or bladder habits. He's had no off. He's had no shortness of breath. He's had no leg swelling.  There's been no bleeding or bruising.  Overall, his performance status is ECOG 0.    Medications:  Current outpatient prescriptions:  .  atenolol (TENORMIN) 50 MG tablet, Take 1 tablet (50 mg total) by mouth daily., Disp: 90 tablet, Rfl: 1 .  diphenhydrAMINE (BENADRYL) 25 mg capsule, Take 25 mg by mouth every 6 (six) hours as needed for itching., Disp: , Rfl:  .  docusate sodium (COLACE) 100 MG capsule, Take 100 mg by mouth daily as needed for mild constipation. , Disp: , Rfl:  .  esomeprazole (NEXIUM) 40 MG capsule, Take 1 capsule (40 mg total) by mouth 2 (two) times daily before a meal., Disp: 60 capsule, Rfl: 2 .  hydrochlorothiazide (HYDRODIURIL) 25 MG tablet, Take 1 tablet (25 mg total) by mouth every morning., Disp: 90 tablet, Rfl: 1 .  HYDROcodone-acetaminophen (NORCO) 10-325 MG per tablet, Take 0.5-1  tablets by mouth every 6 (six) hours as needed for moderate pain., Disp: , Rfl:  .  insulin NPH-regular Human (NOVOLIN 70/30) (70-30) 100 UNIT/ML injection, Inject 10 Units into the skin at bedtime., Disp: , Rfl:  .  lisinopril (PRINIVIL,ZESTRIL) 20 MG tablet, Take 1 tablet (20 mg total) by mouth every morning., Disp: 90 tablet, Rfl: 1 .  NOVOLOG FLEXPEN 100 UNIT/ML SOPN FlexPen, Inject 5-10 Units into the skin 3 (three) times daily. , Disp: , Rfl:  .  propranolol (INDERAL) 10 MG tablet, Take 10 mg by mouth 2 (two) times daily., Disp: , Rfl:  .  sitaGLIPtin-metformin (JANUMET) 50-1000 MG per tablet, Take 1 tablet by mouth 2 (two) times daily with a meal., Disp: 180 tablet, Rfl: 1 .  sodium chloride (OCEAN) 0.65 % SOLN nasal spray, Place 2 sprays into both nostrils daily as needed for congestion., Disp: , Rfl:  No current facility-administered medications for this visit.  Facility-Administered Medications Ordered in Other Visits:  .  sincalide [facility]) injection 2.5 mcg, 0.02 mcg/kg, Intravenous, Once, Medication Radiologist, MD  Allergies:  Allergies  Allergen Reactions  . Byetta 10 Mcg Pen [Exenatide] Nausea And Vomiting    Past Medical History, Surgical history, Social history, and Family History were reviewed and updated.  Review of Systems: As above  Physical Exam:  weight is 271 lb (122.925 kg). His oral temperature is 98.2 F (36.8 C). His blood  pressure is 114/57 and his pulse is 67. His respiration is 18.   Mildly obese white gentleman. Head and neck exam shows no ocular or oral lesions. Neck is supple with no adenopathy. Lungs are clear bilaterally. Cardiac exam regular rate and rhythm with no murmurs, rubs or bruits.. Abdomen is soft. He is moderately obese. There is no fluid wave. No palpable liver or spleen tip. Back exam shows no tenderness over the spine. Extremities no clubbing cyanosis or edema. Skin exam no ecchymoses or petechia. Neurological exam is nonfocal.  Lab  Results  Component Value Date   WBC 2.7* 09/20/2014   HGB 13.6 09/20/2014   HCT 37.9* 09/20/2014   MCV 84 09/20/2014   PLT 83* 09/20/2014     Chemistry      Component Value Date/Time   NA 136 09/03/2014 0930   NA 135 12/28/2012 0809   K 4.7 09/03/2014 0930   K 4.4 12/28/2012 0809   CL 103 09/03/2014 0930   CL 98 12/28/2012 0809   CO2 25 09/03/2014 0930   CO2 26 12/28/2012 0809   BUN 12 09/03/2014 0930   BUN 8 12/28/2012 0809   CREATININE 0.76 09/03/2014 0930   CREATININE 0.91 05/24/2014 1520      Component Value Date/Time   CALCIUM 9.2 09/03/2014 0930   CALCIUM 9.1 12/28/2012 0809   ALKPHOS 113 09/03/2014 0930   ALKPHOS 124* 12/28/2012 0809   AST 20 09/03/2014 0930   AST 119* 12/28/2012 0809   ALT 18 09/03/2014 0930   ALT 62* 12/28/2012 0809   BILITOT 1.5* 09/03/2014 0930   BILITOT 1.70* 12/28/2012 0809         Impression and Plan: Oscar [name] is 49 year old male with chronic immune thrombocytopenia. He has cirrhosis. This is NASH related. He has not had a drink now for over 1 year. I'm G made through his upper endoscopy and colonoscopy without any problems. The pathology did not show any evidence of malignancy. He does have varices.  His white cell count is lower. Again, I think this is totally a reflection of his underlying cirrhosis and splenomegaly.   I want to get him back to see me in about 6 months for follow-up.Volanda Napoleon, MD 5/27/201611:41 AM

## 2014-10-20 NOTE — Patient Instructions (Signed)
Continue same medications and return in 6 months.

## 2014-12-02 ENCOUNTER — Other Ambulatory Visit: Payer: Self-pay | Admitting: Internal Medicine

## 2014-12-22 IMAGING — CR DG CHEST 2V
2 series · 2 of 2 positions shown · non-contrast
Comparison: None

CLINICAL DATA: Hypertension

EXAM:
CHEST  2 VIEW

[w chest pa]
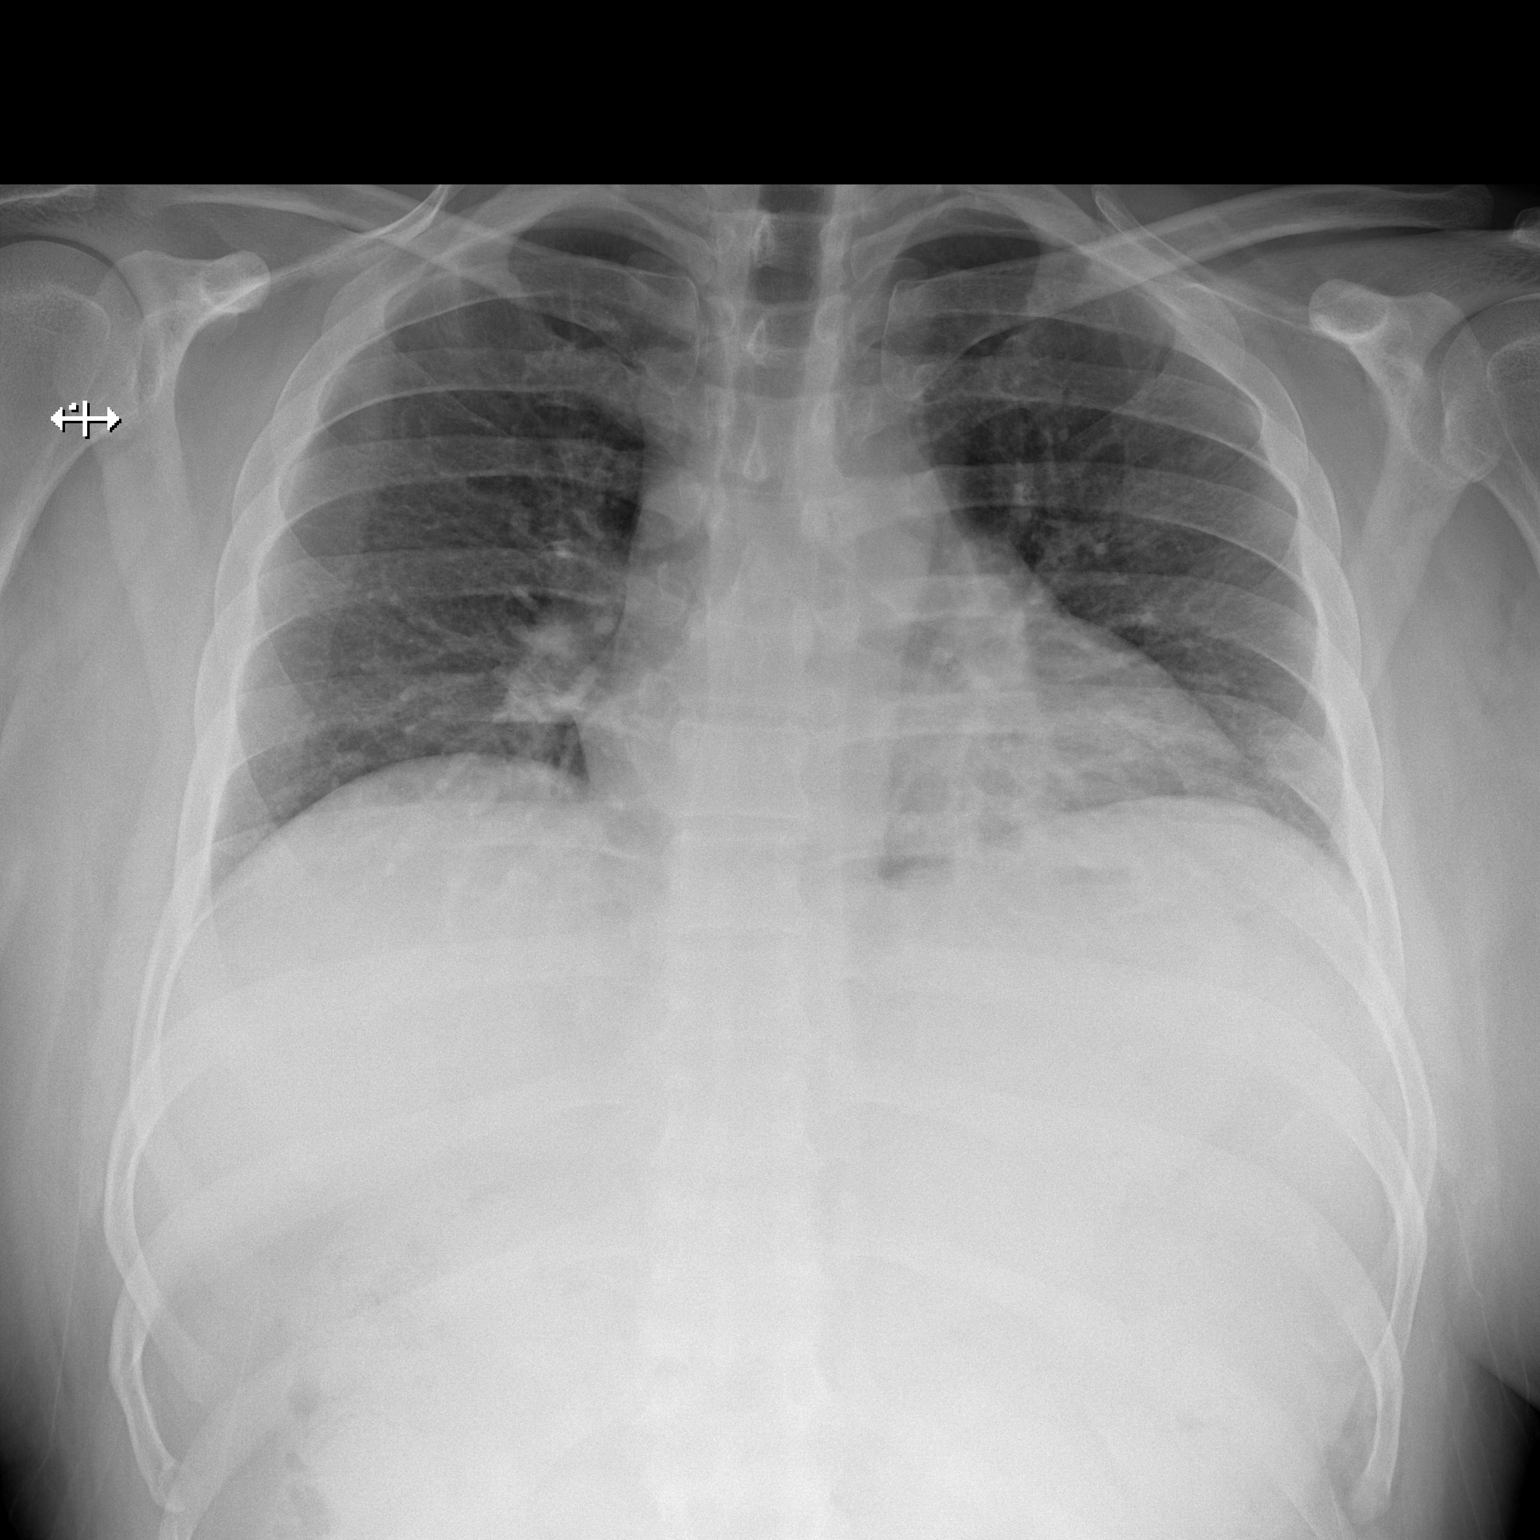

[w chest lat]
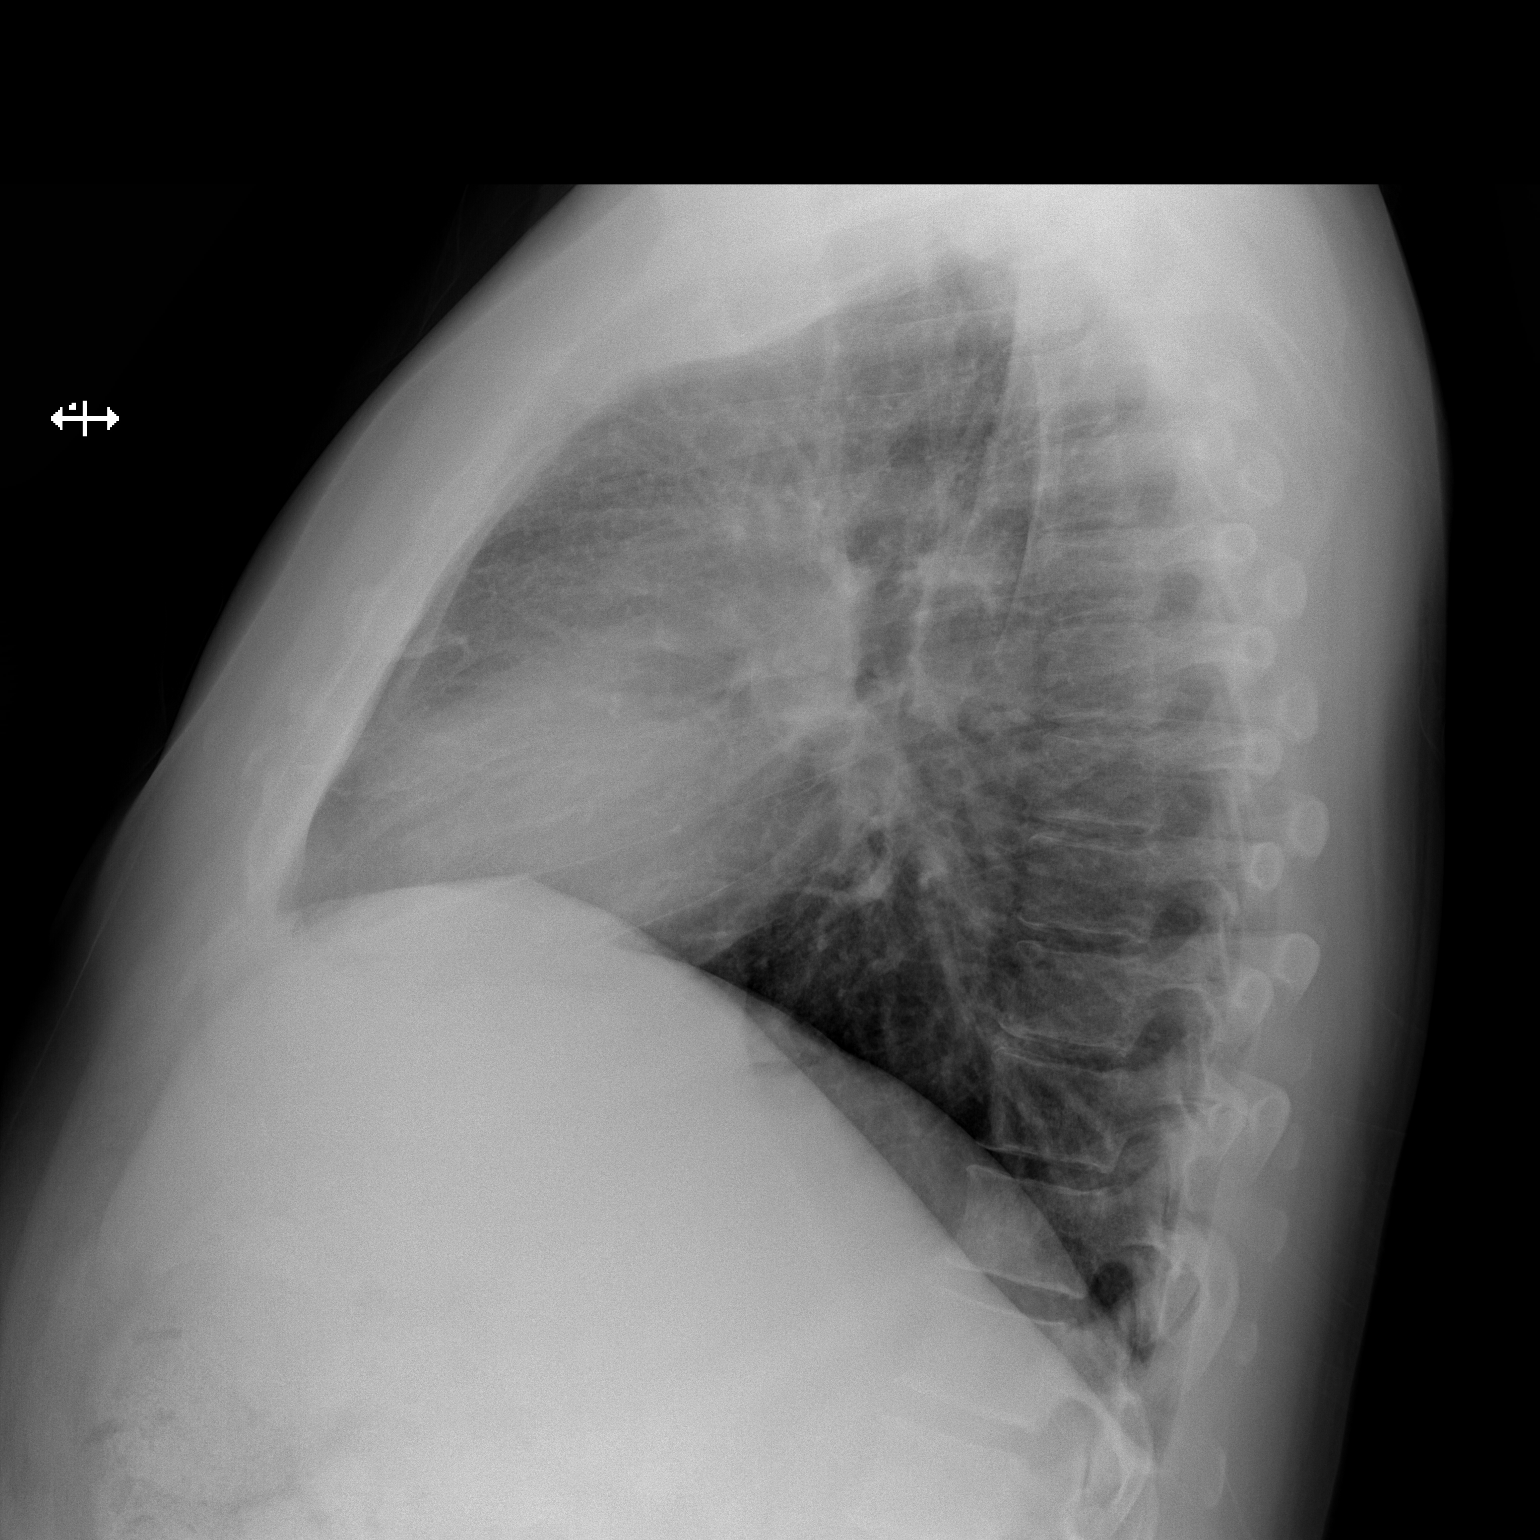

[2 of 2 positions shown; findings below may reference images not displayed]

FINDINGS: Low lung volumes. The heart size and mediastinal contours are within
normal limits. Both lungs are clear. The visualized skeletal
structures are unremarkable.
IMPRESSION: No active cardiopulmonary disease.

## 2014-12-29 IMAGING — DX DG CERVICAL SPINE 2 OR 3 VIEWS
1 series · 1 of 1 positions shown · non-contrast
Comparison: MR MANOLY SPINE W/O CM dated 05/03/2013; CT NECK W/CM dated
02/27/2013

CLINICAL DATA: C5-6 ACDF.

EXAM:
CERVICAL SPINE - 2-3 VIEW

[lat]
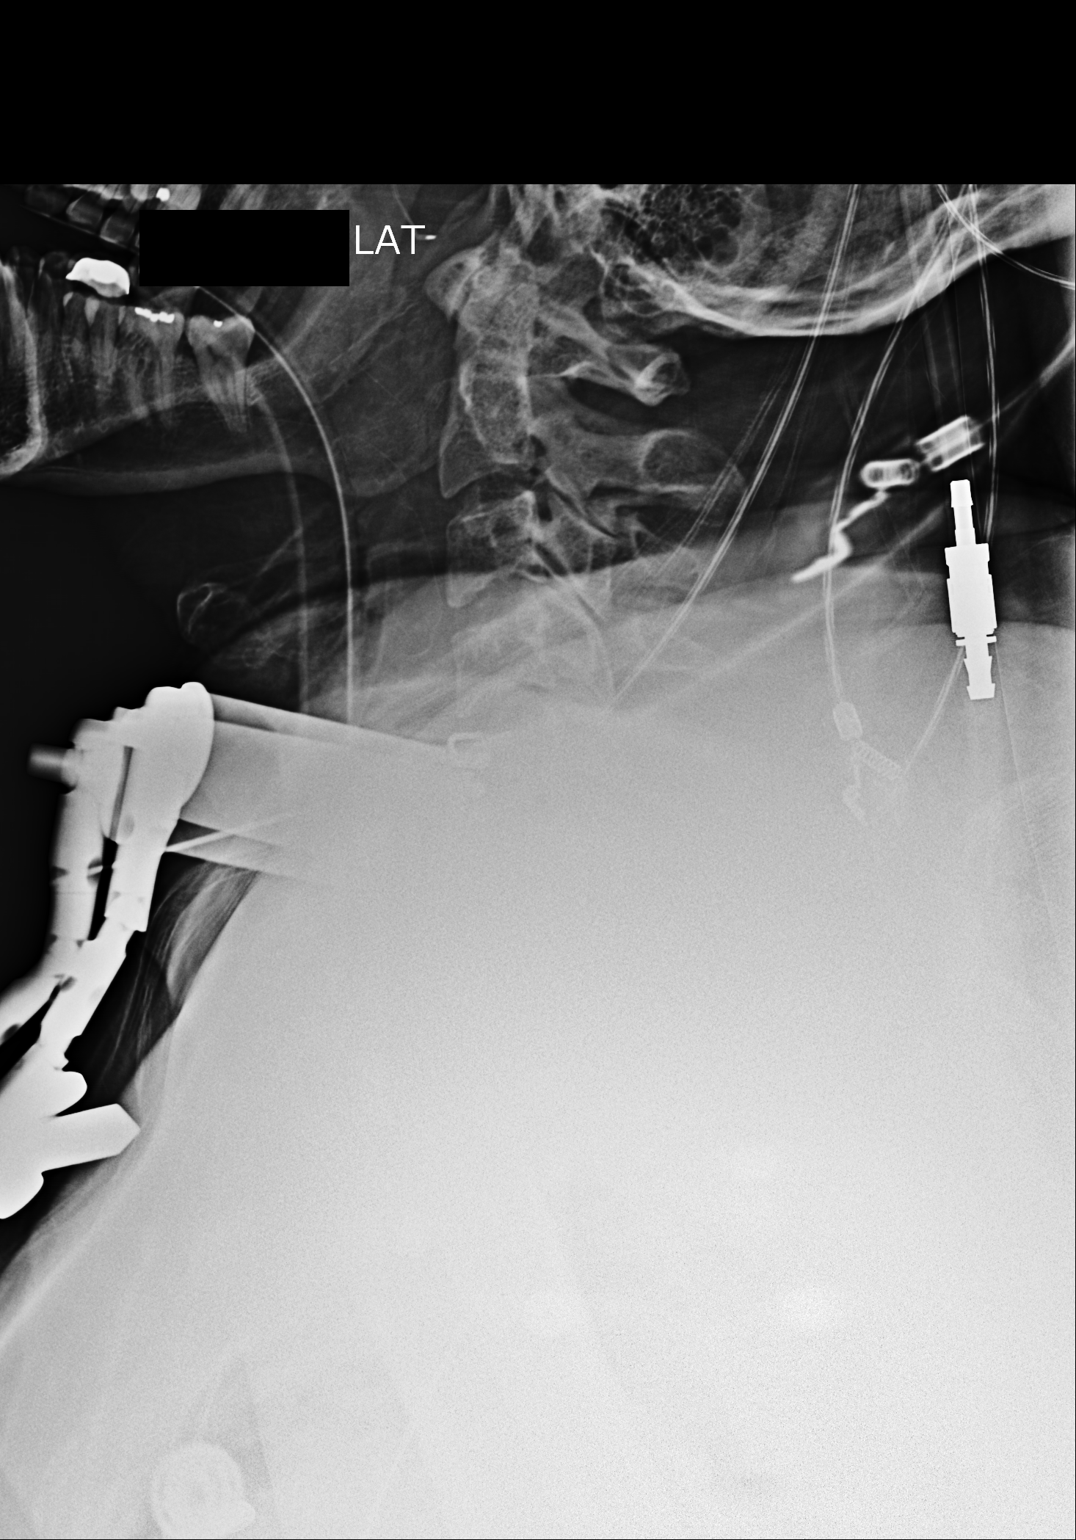

[1 of 1 positions shown; findings below may reference images not displayed]

FINDINGS: Three spot fluoroscopic images of the cervical spine are submitted
from the operating room. On image number 1, there is an anterior
localizing needle at the C2-3 disc space level.

On image number 2, there are skin spreaders anteriorly at C5-6 with
a needle projecting over the C4-5 disc space.

On image number 3,, there is limited visualization inferior to the
C4-5 disc space. The operative level is not adequately visualized.
Surgical sponges are present anteriorly in the lower neck.
IMPRESSION: Intraoperative views during lower cervical fusion. The operative
level is insufficiently visualized.

## 2015-03-11 ENCOUNTER — Ambulatory Visit: Payer: BLUE CROSS/BLUE SHIELD | Admitting: Internal Medicine

## 2015-03-11 ENCOUNTER — Other Ambulatory Visit: Payer: Self-pay | Admitting: Internal Medicine

## 2015-03-17 ENCOUNTER — Encounter: Payer: Self-pay | Admitting: Internal Medicine

## 2015-03-17 ENCOUNTER — Ambulatory Visit (INDEPENDENT_AMBULATORY_CARE_PROVIDER_SITE_OTHER): Payer: BLUE CROSS/BLUE SHIELD | Admitting: Internal Medicine

## 2015-03-17 VITALS — BP 134/78 | HR 80 | Temp 98.7°F | Resp 20 | Ht 68.0 in | Wt 267.0 lb

## 2015-03-17 DIAGNOSIS — Z23 Encounter for immunization: Secondary | ICD-10-CM | POA: Diagnosis not present

## 2015-03-17 DIAGNOSIS — E1165 Type 2 diabetes mellitus with hyperglycemia: Secondary | ICD-10-CM

## 2015-03-17 DIAGNOSIS — E669 Obesity, unspecified: Secondary | ICD-10-CM | POA: Diagnosis not present

## 2015-03-17 DIAGNOSIS — K709 Alcoholic liver disease, unspecified: Secondary | ICD-10-CM

## 2015-03-17 DIAGNOSIS — E785 Hyperlipidemia, unspecified: Secondary | ICD-10-CM | POA: Diagnosis not present

## 2015-03-17 DIAGNOSIS — E1142 Type 2 diabetes mellitus with diabetic polyneuropathy: Secondary | ICD-10-CM

## 2015-03-17 DIAGNOSIS — I10 Essential (primary) hypertension: Secondary | ICD-10-CM | POA: Diagnosis not present

## 2015-03-17 DIAGNOSIS — E8881 Metabolic syndrome: Secondary | ICD-10-CM

## 2015-03-17 MED ORDER — GABAPENTIN 300 MG PO CAPS: ORAL_CAPSULE | ORAL | Status: DC

## 2015-03-17 NOTE — Patient Instructions (Signed)
Start gabapentin 2 capsules at bedtime. Call if symptoms are not improved in 30 days. Need to have fasting lab work next week including fasting lipid panel liver functions and hemoglobin A1c. Dr. Chalmers Cater is following diabetes. Have diabetic eye exam in the near future.

## 2015-03-20 ENCOUNTER — Encounter: Payer: Self-pay | Admitting: Internal Medicine

## 2015-03-24 ENCOUNTER — Encounter: Payer: Self-pay | Admitting: Hematology & Oncology

## 2015-03-24 ENCOUNTER — Ambulatory Visit (HOSPITAL_BASED_OUTPATIENT_CLINIC_OR_DEPARTMENT_OTHER): Payer: BLUE CROSS/BLUE SHIELD | Admitting: Hematology & Oncology

## 2015-03-24 ENCOUNTER — Other Ambulatory Visit (HOSPITAL_BASED_OUTPATIENT_CLINIC_OR_DEPARTMENT_OTHER): Payer: BLUE CROSS/BLUE SHIELD

## 2015-03-24 VITALS — BP 116/83 | HR 75 | Temp 97.8°F | Resp 16 | Ht 68.0 in | Wt 277.0 lb

## 2015-03-24 DIAGNOSIS — D72819 Decreased white blood cell count, unspecified: Secondary | ICD-10-CM

## 2015-03-24 DIAGNOSIS — D693 Immune thrombocytopenic purpura: Secondary | ICD-10-CM | POA: Diagnosis not present

## 2015-03-24 DIAGNOSIS — D61818 Other pancytopenia: Secondary | ICD-10-CM

## 2015-03-24 DIAGNOSIS — K920 Hematemesis: Secondary | ICD-10-CM

## 2015-03-24 LAB — CBC WITH DIFFERENTIAL (CANCER CENTER ONLY)
BASO#: 0 10*3/uL (ref 0.0–0.2)
BASO%: 0.3 % (ref 0.0–2.0)
EOS ABS: 0.1 10*3/uL (ref 0.0–0.5)
EOS%: 2.2 % (ref 0.0–7.0)
HEMATOCRIT: 39 % (ref 38.7–49.9)
HEMOGLOBIN: 13.2 g/dL (ref 13.0–17.1)
LYMPH#: 1.1 10*3/uL (ref 0.9–3.3)
LYMPH%: 35.6 % (ref 14.0–48.0)
MCH: 29.7 pg (ref 28.0–33.4)
MCHC: 33.8 g/dL (ref 32.0–35.9)
MCV: 88 fL (ref 82–98)
MONO#: 0.3 10*3/uL (ref 0.1–0.9)
MONO%: 9.8 % (ref 0.0–13.0)
NEUT#: 1.7 10*3/uL (ref 1.5–6.5)
NEUT%: 52.1 % (ref 40.0–80.0)
Platelets: 80 10*3/uL — ABNORMAL LOW (ref 145–400)
RBC: 4.45 10*6/uL (ref 4.20–5.70)
RDW: 15 % (ref 11.1–15.7)
WBC: 3.2 10*3/uL — AB (ref 4.0–10.0)

## 2015-03-24 LAB — CMP (CANCER CENTER ONLY)
ALBUMIN: 3.3 g/dL (ref 3.3–5.5)
ALT(SGPT): 28 U/L (ref 10–47)
AST: 30 U/L (ref 11–38)
Alkaline Phosphatase: 104 U/L — ABNORMAL HIGH (ref 26–84)
BUN, Bld: 12 mg/dL (ref 7–22)
CALCIUM: 9.4 mg/dL (ref 8.0–10.3)
CHLORIDE: 104 meq/L (ref 98–108)
CO2: 27 meq/L (ref 18–33)
Creat: 0.9 mg/dl (ref 0.6–1.2)
Glucose, Bld: 221 mg/dL — ABNORMAL HIGH (ref 73–118)
Potassium: 4.5 mEq/L (ref 3.3–4.7)
Sodium: 138 mEq/L (ref 128–145)
TOTAL PROTEIN: 6.8 g/dL (ref 6.4–8.1)
Total Bilirubin: 1.5 mg/dl (ref 0.20–1.60)

## 2015-03-24 LAB — CHCC SATELLITE - SMEAR

## 2015-03-24 LAB — TECHNOLOGIST REVIEW CHCC SATELLITE

## 2015-03-24 NOTE — Progress Notes (Signed)
Hematology and Oncology Follow Up Visit  Oscar [name] [id number] 04-May-1965 49 y.o. 03/24/2015   Principle Diagnosis:   Chronic immune thrombocytopenia  Cirrhosis  Leukopenia  Anemia secondary to intermittent GI bleeding  Current Therapy:    Nplate as needed for any procedure  IV iron as indicated     Interim History:  Oscar [name] is back for followup. He is working without difficulties. He is not having any problems with bleeding. He's having no problems with fever. He's had no cough. He's had no rashes. He's had no leg swelling.   There's been no bleeding or bruising.  The last him that we saw him back in May, his ferritin was 14 with iron saturation of 16%.  He has not had any iron probably for 2 years.  He does have bad diabetes. He is on insulin. I did this will be a big problem for him in the future, more so than any issues with low platelets.  He's had no problems with nausea or vomiting.  Overall, his performance status is ECOG 0.    Medications:  Current outpatient prescriptions:  .  atenolol (TENORMIN) 50 MG tablet, TAKE 1 TABLET DAILY, Disp: 90 tablet, Rfl: 0 .  diphenhydrAMINE (BENADRYL) 25 mg capsule, Take 25 mg by mouth every 6 (six) hours as needed for itching., Disp: , Rfl:  .  docusate sodium (COLACE) 100 MG capsule, Take 100 mg by mouth daily as needed for mild constipation. , Disp: , Rfl:  .  esomeprazole (NEXIUM) 40 MG capsule, Take 1 capsule (40 mg total) by mouth 2 (two) times daily before a meal., Disp: 60 capsule, Rfl: 2 .  gabapentin (NEURONTIN) 300 MG capsule, 2 capsules by mouth daily at bedtime for peripheral neuropathy, Disp: 180 capsule, Rfl: 3 .  hydrochlorothiazide (HYDRODIURIL) 25 MG tablet, TAKE 1 TABLET EVERY MORNING, Disp: 90 tablet, Rfl: 0 .  insulin NPH-regular Human (NOVOLIN 70/30) (70-30) 100 UNIT/ML injection, Inject 10 Units into the skin at bedtime., Disp: , Rfl:  .  JANUMET 50-1000 MG per tablet, TAKE 1 TABLET TWICE  A DAY WITH MEALS, Disp: 180 tablet, Rfl: 1 .  lisinopril (PRINIVIL,ZESTRIL) 20 MG tablet, Take 1 tablet (20 mg total) by mouth every morning., Disp: 90 tablet, Rfl: 1 .  NOVOLOG FLEXPEN 100 UNIT/ML SOPN FlexPen, Inject 5-10 Units into the skin 3 (three) times daily. , Disp: , Rfl:  .  propranolol (INDERAL) 10 MG tablet, Take 10 mg by mouth 2 (two) times daily., Disp: , Rfl:  .  sodium chloride (OCEAN) 0.65 % SOLN nasal spray, Place 2 sprays into both nostrils daily as needed for congestion., Disp: , Rfl:   Allergies:  Allergies  Allergen Reactions  . Byetta 10 Mcg Pen [Exenatide] Nausea And Vomiting    Past Medical History, Surgical history, Social history, and Family History were reviewed and updated.  Review of Systems: As above  Physical Exam:  height is 5' 8"  (1.727 m) and weight is 277 lb (125.646 kg). His oral temperature is 97.8 F (36.6 C). His blood pressure is 116/83 and his pulse is 75. His respiration is 16.   Mildly obese white gentleman. Head and neck exam shows no ocular or oral lesions. Neck is supple with no adenopathy. Lungs are clear bilaterally. Cardiac exam regular rate and rhythm with no murmurs, rubs or bruits.. Abdomen is soft. He is moderately obese. There is no fluid wave. No palpable liver or spleen tip. Back exam shows no tenderness over the  spine. Extremities no clubbing cyanosis or edema. Skin exam no ecchymoses or petechia. Neurological exam is nonfocal.  Lab Results  Component Value Date   WBC 3.2* 03/24/2015   HGB 13.2 03/24/2015   HCT 39.0 03/24/2015   MCV 88 03/24/2015   PLT 80 Platelet count consistent in citrate* 03/24/2015     Chemistry      Component Value Date/Time   NA 133* 09/20/2014 1021   NA 135 12/28/2012 0809   K 3.5 09/20/2014 1021   K 4.4 12/28/2012 0809   CL 103 09/20/2014 1021   CL 98 12/28/2012 0809   CO2 20 09/20/2014 1021   CO2 26 12/28/2012 0809   BUN 12 09/20/2014 1021   BUN 8 12/28/2012 0809   CREATININE 0.70  09/20/2014 1021   CREATININE 0.76 09/03/2014 0930      Component Value Date/Time   CALCIUM 8.9 09/20/2014 1021   CALCIUM 9.1 12/28/2012 0809   ALKPHOS 116 09/20/2014 1021   ALKPHOS 124* 12/28/2012 0809   AST 24 09/20/2014 1021   AST 119* 12/28/2012 0809   ALT 22 09/20/2014 1021   ALT 62* 12/28/2012 0809   BILITOT 1.5* 09/20/2014 1021   BILITOT 1.70* 12/28/2012 0809         Impression and Plan: [name] is 49 year old male with chronic immune thrombocytopenia. He has cirrhosis. This is NASH related. He has not had a drink now for over 11/2 years   He is not anticipating any procedures that need to be done in which we had to get his plate count back up.  I want to get him back to see me in about 6 months for follow-up.Oscar Napoleon, MD 11/28/20163:33 PM

## 2015-03-25 ENCOUNTER — Other Ambulatory Visit: Payer: BLUE CROSS/BLUE SHIELD | Admitting: Internal Medicine

## 2015-03-25 DIAGNOSIS — Z794 Long term (current) use of insulin: Secondary | ICD-10-CM

## 2015-03-25 DIAGNOSIS — E785 Hyperlipidemia, unspecified: Secondary | ICD-10-CM

## 2015-03-25 DIAGNOSIS — E119 Type 2 diabetes mellitus without complications: Secondary | ICD-10-CM

## 2015-03-25 DIAGNOSIS — K7030 Alcoholic cirrhosis of liver without ascites: Secondary | ICD-10-CM

## 2015-03-25 LAB — LIPID PANEL
CHOL/HDL RATIO: 2.8 ratio (ref <=5.0)
Cholesterol: 130 mg/dL (ref 125–200)
HDL: 46 mg/dL (ref >=40)
LDL CALC: 66 mg/dL (ref <130)
Triglycerides: 92 mg/dL (ref <150)
VLDL: 18 mg/dL (ref <30)

## 2015-03-25 LAB — HEPATIC FUNCTION PANEL
ALK PHOS: 111 U/L (ref 40–115)
ALT: 19 U/L (ref 9–46)
AST: 24 U/L (ref 10–40)
Albumin: 3.6 g/dL (ref 3.6–5.1)
BILIRUBIN DIRECT: 0.4 mg/dL — AB (ref <=0.2)
BILIRUBIN INDIRECT: 1.2 mg/dL (ref 0.2–1.2)
BILIRUBIN TOTAL: 1.6 mg/dL — AB (ref 0.2–1.2)
Total Protein: 6.8 g/dL (ref 6.1–8.1)

## 2015-03-25 LAB — HEMOGLOBIN A1C
Hgb A1c MFr Bld: 10 % — ABNORMAL HIGH (ref <5.7)
Mean Plasma Glucose: 240 mg/dL — ABNORMAL HIGH (ref <117)

## 2015-05-23 ENCOUNTER — Other Ambulatory Visit: Payer: Self-pay | Admitting: Internal Medicine

## 2015-06-10 ENCOUNTER — Ambulatory Visit: Payer: BLUE CROSS/BLUE SHIELD | Admitting: Physical Therapy

## 2015-06-18 DIAGNOSIS — E8881 Metabolic syndrome: Secondary | ICD-10-CM | POA: Insufficient documentation

## 2015-06-18 DIAGNOSIS — E669 Obesity, unspecified: Secondary | ICD-10-CM

## 2015-06-18 NOTE — Progress Notes (Signed)
   Subjective:    Patient ID: Oscar [name], male    DOB: 04-13-1966, 50 y.o.   MRN: [id number]  HPI 50 year old Male last seen May 2016 in today for six-month recheck on metabolic syndrome,  Insulin-dependent diabetes mellitus, obesity, essential hypertension, elevated liver enzymes, hyperlipidemia.History of thrombocytopenia and leukopenia followed by Dr. Marin [name].    Review of Systems Dr. Chalmers [name] is following diabetes. New complaint today is peripheral neuropathy likely secondary to diabetes     Objective:   Physical Exam Skin warm and dry. Nodes none. Neck is supple without JVD thyromegaly or carotid bruits. Chest clear to auscultation. Cardiac exam regular rate and rhythm normal S1 and S2. Extremities without edema. Obese, in no acute distress.       Assessment & Plan:  Obesity-needs to be serious about diet exercise and weight loss  Metabolic syndrome  Hyperlipidemia-lipid panel normal  Essential hypertension-stable  Adenomatous colon polyps on colonoscopy [id number]  Alcoholic liver disease-[facility] has treated patient previously  History of esophageal varices   History of alcohol abuse  Insulin-dependent diabetes mellitus-hemoglobin A1c 10%-followed by endocrinologist, Dr. Chalmers [name]  Thrombocytopenia-followed by hematologist Dr. Marin [name]  Leukopenia-followed by Dr. Marin [name]  Hyperbilirubinemia- mild-total bilirubin 1.6 and direct bilirubin 0.4 other liver functions within normal limits  Peripheral neuropathy -  treat with gabapentin  Plan: Return in 6 months or sooner if necessary. Endocrinologist treating diabetes. Try gabapentin for peripheral neuropathy

## 2015-06-24 ENCOUNTER — Ambulatory Visit: Payer: BLUE CROSS/BLUE SHIELD | Attending: Neurosurgery | Admitting: Physical Therapy

## 2015-06-24 DIAGNOSIS — R29898 Other symptoms and signs involving the musculoskeletal system: Secondary | ICD-10-CM

## 2015-06-24 DIAGNOSIS — M542 Cervicalgia: Secondary | ICD-10-CM

## 2015-06-25 NOTE — Therapy (Signed)
[facility] [address]  [place], [place], [address] Phone: [phone]   Fax:  [phone]  Physical Therapy Evaluation  Patient Details  Name: Oscar [name] MRN: [id number] Date of Birth: [birth year] Referring Provider: Newman Pies, MD  Encounter Date: 06/24/2015      PT End of Session - 06/24/15 1631    Visit Number 1   Number of Visits 12   Date for PT Re-Evaluation 08/05/15   PT Start Time 1450   PT Stop Time 1533   PT Time Calculation (min) 43 min   Activity Tolerance Patient tolerated treatment well   Behavior During Therapy [facility] for tasks assessed/performed      Past Medical History  Diagnosis Date  . Fatty liver   . Obesity   . ITP (idiopathic thrombocytopenic purpura) 05/16/2013  . History of colon polyps   . Hypertension     takes Atenolol,HCTZ,and Lisinopril daily  . Gastric varices     takes Propranlol for this  . Numbness hands and fingers    and tingling  . Neck pain     HNP and radiculpathy  . GERD (gastroesophageal reflux disease)     takes Nexium daily  . GI bleed june 2015  . Esophageal varices ([place])   . Urinary frequency   . Constipation     related to pain meds and takes Colace daily  . Insomnia     but doesn't take any meds  . History of staph infection 2014  . Sleep apnea     sleep study in epic from 2006 but doesn't use a cpap (10/18/2013)  . Type II diabetes mellitus (HCC)     takes Novolin and Novolog and takes Janumet daily  . [id number])     "weekly" (10/18/2013)  . Osteoarthritis     "hands; elbows; knees" (10/18/2013)  . Hematemesis 10/18/2013    hospitalized  . Hepatitis yrs ago    hx of Hep A  . Pancytopenia ([place])     but never had a blood transfusion, had to take iron transfusion  sept 2014    Past Surgical History  Procedure Laterality Date  . Esophagogastroduodenoscopy (egd) with propofol N/A 02/07/2013    Procedure:  ESOPHAGOGASTRODUODENOSCOPY (EGD) WITH PROPOFOL;  Surgeon: Arta Silence, MD;  Location: [facility] ENDOSCOPY;  Service: Endoscopy;  Laterality: N/A;  . Colonoscopy with propofol N/A 02/07/2013    Procedure: COLONOSCOPY WITH PROPOFOL;  Surgeon: Arta Silence, MD;  Location: [facility] ENDOSCOPY;  Service: Endoscopy;  Laterality: N/A;  . Incision and drainage mouth  02/2013    " jaw area"  . Wisdom tooth extraction  ~  1997  . Anterior cervical decomp/discectomy fusion N/A 05/30/2013    Procedure: Cervical five-six anterior cervical decompression with interbody prosthesis plating and bonegraft;  Surgeon: Ophelia Charter, MD;  Location: [facility]URO ORS;  Service: Neurosurgery;  Laterality: N/A;  Cervical five-six anterior cervical decompression with interbody prosthesis plating and bonegraft  . Esophagogastroduodenoscopy N/A 10/19/2013    Procedure: ESOPHAGOGASTRODUODENOSCOPY (EGD);  Surgeon: Jeryl Columbia, MD;  Location: [facility] ENDOSCOPY;  Service: Endoscopy;  Laterality: N/A;  . Esophagogastroduodenoscopy (egd) with propofol N/A 03/06/2014    Procedure: ESOPHAGOGASTRODUODENOSCOPY (EGD) WITH PROPOFOL;  Surgeon: Arta Silence, MD;  Location: [facility] ENDOSCOPY;  Service: Endoscopy;  Laterality: N/A;  . Eus N/A 03/06/2014    Procedure: ESOPHAGEAL ENDOSCOPIC ULTRASOUND (EUS) RADIAL;  Surgeon: Arta Silence, MD;  Location: [facility] ENDOSCOPY;  Service: Endoscopy;  Laterality: N/A;  . Colonoscopy  with propofol N/A 03/06/2014    Procedure: COLONOSCOPY WITH PROPOFOL;  Surgeon: Arta Silence, MD;  Location: [facility] ENDOSCOPY;  Service: Endoscopy;  Laterality: N/A;    There were no vitals filed for this visit.  Visit Diagnosis:  Cervicalgia  Decreased ROM of neck      Subjective Assessment - 06/24/15 1456    Subjective Patient reports h/o C5-6 fusion ~ 3 yrs ago. Had been to therapy ~1 yr after surgery with positive results. Patient reports returning neck pain and stiffness for past 6 months. Pain associated with "pressure headaches"  and limits ability to look up or over shoulder, esp to L, while working.    Pertinent History 05/30/13: C5-6 ACDF   Diagnostic tests Patient reports recent x-ray taken at MD office and states he was told everything was well healed and nothing had changed from prior exams.   Patient Stated Goals "To move neck without pain/stiffness"   Currently in Pain? 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  No   Is the patient reluctant to leave their home because of a fear of falling?  No   Prior Function   Level of Independence Independent   Vocation Full time employment   Occupational hygienist racing, working on cars   Observation/Other Assessments   Focus on Therapeutic Outcomes (FOTO)  Neck - 61% (39% limitation); Predicted 63% (37% limitation)   Posture/Postural Control   Posture/Postural Control Postural limitations   Postural Limitations Forward head;Rounded Shoulders   ROM / Strength   AROM / PROM / Strength AROM;Strength   AROM    Overall AROM Comments Shoulder WNL   AROM Assessment Site Cervical;Shoulder   Cervical Flexion 32  pulling sensation   Cervical Extension 35   Cervical - Right Side Bend 31   Cervical - Left Side Bend 30   Cervical - Right Rotation 50   Cervical - Left Rotation 42   Strength   Overall Strength Comments Bilateral shoulder, elbow, forearm and wrist strength grossly WNL and symmetrical   Strength Assessment Site Shoulder;Elbow;Forearm;Wrist;Hand   Right/Left Shoulder --   Right/Left hand Right;Left   Right Hand Gross Grasp Functional   Right Hand Grip (lbs) 66   Right Hand Lateral Pinch 15 lbs   Right Hand 3 Point Pinch 14 lbs   Left Hand Gross Grasp Functional   Left Hand Grip (lbs)  57   Left Hand Lateral Pinch 14 lbs   Left Hand 3 Point Pinch 12 lbs   Palpation   Palpation comment ttp over L levator and upper trap with increased muscle tension noted as well as tightness in pectoralis muscles        Today's Treatment  Postural education - neutral cervical spine and shoulder posture  TherEx HEP Instruction: Corner/doorway stretch 3x20" Seated chin tuck x10 Shoulder Rows with blue TB 10x3" Scapular Retraction with Shoulder Ext to neutral with blue TB 10x3"          PT Education - 06/24/15 1635    Education provided Yes   Education Details PT eval findings & POC, Postural awareness, Inital HEP   Person(s) Educated Patient   Methods Explanation;Demonstration;Handout   Comprehension Verbalized understanding;Returned demonstration;Need further instruction          PT Short Term Goals - 06/24/15 1533    PT SHORT TERM GOAL #1   Title Pt will be independent with inital HEP by 07/08/15   Status New           PT Long Term Goals - 06/24/15 1533    PT LONG TERM GOAL #1   Title Pt will be independent with latest HEP by 08/05/15   Status New   PT LONG TERM GOAL #2   Title Pt will demonstrate improved cervical extension and bilateral rotation by at least 10 degrees  by 08/05/15   Status New   PT LONG TERM GOAL #3   Title Pt will report no further radicular symptoms in L UE by 08/05/15   Status New   PT LONG TERM GOAL #4   Title Pt will report no further "pressure headaches" by 08/05/15   Status New   PT LONG TERM GOAL #5   Title Pt will report ability to operate forklift with limitations due to neck pain or stiffness by 08/05/15   Status New               Plan - 06/24/15 1533    Clinical Impression Statement Patient is a 50 y/o male who presents to OP PT with ~6 month h/o worsening neck pain and stiffness with "pressure headaches" and intermittent radicular numbness down L UE to fingers. Patient previously underwent C5-6 ACDF on 05/30/13, followed ~1 yr later by PT, both with positive results per patient. Assessment reveals significant forward head and rounded shoulder posture with mild to moderately restricted cervical ROM in all planes. Bilateral UE ROM and strength WNL. 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   Pt will benefit from skilled therapeutic intervention in order to improve on the following deficits Pain;Decreased range of motion;Hypomobility;Impaired flexibility;Impaired sensation;Postural dysfunction   Rehab Potential Good   PT Frequency 2x / week   PT Duration 6 weeks   PT Treatment/Interventions Patient/family education;Therapeutic exercise;Manual techniques;Passive range of motion;Taping;Therapeutic activities;Ultrasound;Moist Heat;Electrical Stimulation;Iontophoresis 4mg /ml Dexamethasone;Cryotherapy   PT Next Visit Plan Review initial HEP and postural education; Postural training; Cervical and thoracic ROM and flexibility; Manual therapy and modalities PRN   Consulted and Agree with Plan of Care Patient         Problem List Patient Active Problem List   Diagnosis Date Noted  . Obesity 06/18/2015  . Metabolic syndrome  [id number]  . Hematemesis 10/18/2013  . GERD (gastroesophageal reflux disease)   . Esophageal varices ([place])   . Cervical herniated disc  05/30/2013  . ITP (idiopathic thrombocytopenic purpura) 05/16/2013  . insulin-dependent diabetes mellitus 04/27/2013  . Habitual alcohol use 02/07/2013  . Other pancytopenia ([place]) 06/15/2012  . Anemia 05/27/2012  . Hypertension 02/21/2011  . Hyperlipidemia 02/21/2011  . Fatty liver 02/21/2011  . Elevated liver enzymes 02/21/2011  . GE reflux 02/21/2011    Percival Spanish, PT, MPT 06/25/2015, 11:09 AM  [facility] [address]  [place], [place], [address] Phone: [phone]   Fax:  [phone]  Name: Oscar [name] MRN: [id number] Date of Birth: 1965-05-29

## 2015-07-01 ENCOUNTER — Ambulatory Visit: Payer: BLUE CROSS/BLUE SHIELD

## 2015-07-03 ENCOUNTER — Encounter: Payer: Self-pay | Admitting: Rehabilitative and Restorative Service Providers"

## 2015-07-03 ENCOUNTER — Ambulatory Visit: Payer: BLUE CROSS/BLUE SHIELD | Attending: Neurosurgery | Admitting: Rehabilitative and Restorative Service Providers"

## 2015-07-03 DIAGNOSIS — R29898 Other symptoms and signs involving the musculoskeletal system: Secondary | ICD-10-CM | POA: Diagnosis present

## 2015-07-03 DIAGNOSIS — M542 Cervicalgia: Secondary | ICD-10-CM

## 2015-07-03 NOTE — Patient Instructions (Signed)
Shoulder Blade Squeeze    Rotate shoulders back, then squeeze shoulder blades down and back Hold 10 sec Repeat _10_ times. Do __several _ sessions per day.   Axial Extension (Chin Tuck)    Pull chin in and lengthen back of neck. Hold __10-15__ seconds while counting out loud. Repeat __10__ times. Do __several  sessions per day. Throughout the day - working on posture and alignment   Scapula Adduction With Pectoralis Stretch: Low - Standing   Shoulders at 45 hands even with shoulders, keeping weight through legs, shift weight forward until you feel pull or stretch through the front of your chest. Hold _30__ seconds. Do _3__ times, _2-4__ times per day.   Scapula Adduction With Pectoralis Stretch: Mid-Range - Standing   Shoulders at 90 elbows even with shoulders, keeping weight through legs, shift weight forward until you feel pull or strength through the front of your chest. Hold __30_ seconds. Do _3__ times, __2-4_ times per day.   Scapula Adduction With Pectoralis Stretch: High - Standing   Shoulders at 120 hands up high on the doorway, keeping weight on feet, shift weight forward until you feel pull or stretch through the front of your chest. Hold _30__ seconds. Do _3__ times, _2-3__ times per day.  Resisted External Rotation: in Neutral - Bilateral   PALMS UP Sit or stand, tubing in both hands, elbows at sides, bent to 90, forearms forward. Pinch shoulder blades together and rotate forearms out. Keep elbows at sides. Repeat __10__ times per set. Do _2-3___ sets per session. Do _2-3___ sessions per day.   Low Row: Standing   Face anchor, feet shoulder width apart. Palms up, pull arms back, squeezing shoulder blades together. Repeat 10__ times per set. Do 2-3__ sets per session. Do 2-3__ sessions per week. Anchor Height: Waist   Strengthening: Resisted Extension   Hold tubing in right hand, arm forward. Pull arm back, elbow straight. Repeat _10___ times per  set. Do 2-3____ sets per session. Do 2-3____ sessions per day.

## 2015-07-03 NOTE — Therapy (Signed)
[facility] [address]  [place], [place], [address] Phone: [phone]   Fax:  [phone]  Physical Therapy Treatment  Patient Details  Name: Oscar [name] MRN: [id number] Date of Birth: May 05, 1965 Referring Provider: Newman Pies, MD  Encounter Date: 07/03/2015      PT End of Session - 07/03/15 1527    Visit Number 2   Number of Visits 12   Date for PT Re-Evaluation 08/05/15   PT Start Time 1527   PT Stop Time 1611   PT Time Calculation (min) 44 min   Activity Tolerance Patient tolerated treatment well      Past Medical History  Diagnosis Date  . Fatty liver   . Obesity   . ITP (idiopathic thrombocytopenic purpura) 05/16/2013  . History of colon polyps   . Hypertension     takes Atenolol,HCTZ,and Lisinopril daily  . Gastric varices     takes Propranlol for this  . Numbness hands and fingers    and tingling  . Neck pain     HNP and radiculpathy  . GERD (gastroesophageal reflux disease)     takes Nexium daily  . GI bleed june 2015  . Esophageal varices ([place])   . Urinary frequency   . Constipation     related to pain meds and takes Colace daily  . Insomnia     but doesn't take any meds  . History of staph infection 2014  . Sleep apnea     sleep study in epic from 2006 but doesn't use a cpap (10/18/2013)  . Type II diabetes mellitus (HCC)     takes Novolin and Novolog and takes Janumet daily  . [id number])     "weekly" (10/18/2013)  . Osteoarthritis     "hands; elbows; knees" (10/18/2013)  . Hematemesis 10/18/2013    hospitalized  . Hepatitis yrs ago    hx of Hep A  . Pancytopenia ([place])     but never had a blood transfusion, had to take iron transfusion  sept 2014    Past Surgical History  Procedure Laterality Date  . Esophagogastroduodenoscopy (egd) with propofol N/A 02/07/2013    Procedure: ESOPHAGOGASTRODUODENOSCOPY (EGD) WITH PROPOFOL;  Surgeon: Arta Silence, MD;   Location: [facility];  Service: Endoscopy;  Laterality: N/A;  . Colonoscopy with propofol N/A 02/07/2013    Procedure: COLONOSCOPY WITH PROPOFOL;  Surgeon: Arta Silence, MD;  Location: [facility];  Service: Endoscopy;  Laterality: N/A;  . Incision and drainage mouth  02/2013    " jaw area"  . Wisdom tooth extraction  ~  1997  . Anterior cervical decomp/discectomy fusion N/A 05/30/2013    Procedure: Cervical five-six anterior cervical decompression with interbody prosthesis plating and bonegraft;  Surgeon: Ophelia Charter, MD;  Location: [place] [facility];  Service: Neurosurgery;  Laterality: N/A;  Cervical five-six anterior cervical decompression with interbody prosthesis plating and bonegraft  . Esophagogastroduodenoscopy N/A 10/19/2013    Procedure: ESOPHAGOGASTRODUODENOSCOPY (EGD);  Surgeon: Jeryl Columbia, MD;  Location: [facility];  Service: Endoscopy;  Laterality: N/A;  . Esophagogastroduodenoscopy (egd) with propofol N/A 03/06/2014    Procedure: ESOPHAGOGASTRODUODENOSCOPY (EGD) WITH PROPOFOL;  Surgeon: Arta Silence, MD;  Location: [facility];  Service: Endoscopy;  Laterality: N/A;  . Eus N/A 03/06/2014    Procedure: ESOPHAGEAL ENDOSCOPIC ULTRASOUND (EUS) RADIAL;  Surgeon: Arta Silence, MD;  Location: [facility];  Service: Endoscopy;  Laterality: N/A;  . Colonoscopy with propofol N/A 03/06/2014    Procedure: COLONOSCOPY  WITH PROPOFOL;  Surgeon: Arta Silence, MD;  Location: [facility];  Service: Endoscopy;  Laterality: N/A;    There were no vitals filed for this visit.  Visit Diagnosis:  Cervicalgia  Decreased ROM of neck      Subjective Assessment - 07/03/15 1527    Subjective Patient reports that he had some stiffness in the Lt side of his neck today which felt he "pulled something" but it "worked it'self out in 3-4 hours".  Patient reports h/o C5-6 fusion ~ 3 yrs ago. Had been to therapy ~1 yr after surgery with positive results. Patient reports returning neck pain and  stiffness for past 6 months. Pain associated with "pressure headaches" and limits ability to look up or over shoulder, esp to L, while working.    Pertinent History 05/30/13: C5-6 ACDF   Currently in Pain? Yes   Pain Score 5    Pain Location Neck   Pain Orientation Left;Lateral   Pain Descriptors / Indicators Burning   Pain Type Chronic pain   Pain Onset More than a month ago   Pain Frequency Intermittent                         [facility] Adult PT Treatment/Exercise - 07/03/15 0001    Neck Exercises: Standing   Neck Retraction 5 reps;10 secs   Shoulder Exercises: Standing   Extension 10 reps;Theraband   Theraband Level (Shoulder Extension) Level 3 (Green)   Row 10 reps;Theraband   Theraband Level (Shoulder Row) Level 3 (Green)   Retraction 10 reps;Theraband  with swim noodle for scapular retraction    Theraband Level (Shoulder Retraction) Level 2 (Red)   Other Standing Exercises scap squeeze 10 sec x 10 with swim noodle    Shoulder Exercises: Stretch   Corner Stretch --  3 way doorway stretch 30 sec x 3 positioins 3 reps    Manual Therapy   Manual therapy comments pt sitting and supine    Soft tissue mobilization upper trap; pecs Lt; bilat Lt >> Rt ant/lat/post cervical musculature    Myofascial Release pecs/traps   Passive ROM cervical stretch    Manual Traction manual cervical traction and occipital release                 PT Education - 07/03/15 1542    Education provided Yes   Education Details HEP; posture and alignment - patient sleeps on stomach - discussed improtance of avoiding this for neck issues discussed sleeping on his sides with pillows to support arm/btn legs and towel to support head and neck in neutral position. 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 and alternating the direction he turnes head in    [employer]) Educated Patient   Methods Explanation;Demonstration;Tactile cues;Verbal cues;Handout   Comprehension Verbalized  understanding;Returned demonstration;Verbal cues required;Tactile cues required          PT Short Term Goals - 07/03/15 1616    PT SHORT TERM GOAL #1   Title Pt will be independent with inital HEP by 07/08/15   Status On-going           PT Long Term Goals - 07/03/15 1616    PT LONG TERM GOAL #1   Title Pt will be independent with latest HEP by 08/05/15   Status On-going   PT LONG TERM GOAL #2   Title Pt will demonstrate improved cervical extension and bilateral rotation by at least 10 degrees by 08/05/15   Status On-going  PT LONG TERM GOAL #3   Title Pt will report no further radicular symptoms in L UE by 08/05/15   Status On-going   PT LONG TERM GOAL #4   Title Pt will report no further "pressure headaches" by 08/05/15   Status On-going   PT LONG TERM GOAL #5   Title 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 addressed ergonomics at home and work; posture and alignment; importance of stretching anterior chest and strengthening posterior shoulder girdle to improve cervical posture. good response to treatmetn. Progressing well toward stated goals of therapy.    Pt will benefit from skilled therapeutic intervention in order to improve on the following deficits Pain;Decreased range of motion;Hypomobility;Impaired flexibility;Impaired sensation;Postural dysfunction   Rehab Potential Good   PT Frequency 2x / week   PT Duration 6 weeks   PT Treatment/Interventions Patient/family education;Therapeutic exercise;Manual techniques;Passive range of motion;Taping;Therapeutic activities;Ultrasound;Moist Heat;Electrical Stimulation;Iontophoresis 4mg /ml Dexamethasone;Cryotherapy   PT Next Visit Plan Review HEP and postural education; Postural training;  Cervical and thoracic ROM and flexibility; Manual therapy and modalities PRN   PT Home Exercise Plan HEP; postural correction   Consulted and Agree with Plan of Care Patient        Problem List Patient Active Problem List   Diagnosis Date Noted  . Obesity 06/18/2015  . Metabolic syndrome [id number]  . Hematemesis 10/18/2013  . GERD (gastroesophageal reflux disease)   . Esophageal varices ([place])   . Cervical herniated disc 05/30/2013  . ITP (idiopathic thrombocytopenic purpura) 05/16/2013  . insulin-dependent diabetes mellitus 04/27/2013  . Habitual alcohol use 02/07/2013  . Other pancytopenia ([place]) 06/15/2012  . Anemia 05/27/2012  . Hypertension 02/21/2011  . Hyperlipidemia 02/21/2011  . Fatty liver 02/21/2011  . Elevated liver enzymes 02/21/2011  . GE reflux 02/21/2011    Oscar [name] Nilda Simmer PT, MPH  07/03/2015, 4:18 PM  [facility] [address]  [place], [place], [address] Phone: [phone]   Fax:  [phone]  Name: Oscar [name] MRN: [id number] Date of Birth: Aug 06, 1965

## 2015-07-07 ENCOUNTER — Ambulatory Visit: Payer: BLUE CROSS/BLUE SHIELD

## 2015-07-07 DIAGNOSIS — M542 Cervicalgia: Secondary | ICD-10-CM

## 2015-07-07 DIAGNOSIS — R29898 Other symptoms and signs involving the musculoskeletal system: Secondary | ICD-10-CM

## 2015-07-07 NOTE — Therapy (Signed)
[facility] [address]  [place], [place], [address] Phone: [phone]   Fax:  [phone]  Physical Therapy Treatment  Patient Details  Name: Oscar [name] MRN: [id number] Date of Birth: [birth year] Referring Provider: Newman Pies, MD  Encounter Date: 07/07/2015      PT End of Session - 07/07/15 1534    Visit Number 3   Number of Visits 12   Date for PT Re-Evaluation 08/05/15   PT Start Time [id number]   PT Stop Time 1619   PT Time Calculation (min) 45 min   Activity Tolerance Patient tolerated treatment well   Behavior During Therapy [facility] for tasks assessed/performed      Past Medical History  Diagnosis Date  . Fatty liver   . Obesity   . ITP (idiopathic thrombocytopenic purpura) 05/16/2013  . History of colon polyps   . Hypertension     takes Atenolol,HCTZ,and Lisinopril daily  . Gastric varices     takes Propranlol for this  . Numbness hands and fingers    and tingling  . Neck pain     HNP and radiculpathy  . GERD (gastroesophageal reflux disease)     takes Nexium daily  . GI bleed june 2015  . Esophageal varices ([place])   . Urinary frequency   . Constipation     related to pain meds and takes Colace daily  . Insomnia     but doesn't take any meds  . History of staph infection 2014  . Sleep apnea     sleep study in epic from 2006 but doesn't use a cpap (10/18/2013)  . Type II diabetes mellitus (HCC)     takes Novolin and Novolog and takes Janumet daily  . [id number])     "weekly" (10/18/2013)  . Osteoarthritis     "hands; elbows; knees" (10/18/2013)  . Hematemesis 10/18/2013    hospitalized  . Hepatitis yrs ago    hx of Hep A  . Pancytopenia ([place])     but never had a blood transfusion, had to take iron transfusion  sept 2014    Past Surgical History  Procedure Laterality Date  . Esophagogastroduodenoscopy (egd) with propofol N/A 02/07/2013    Procedure:  ESOPHAGOGASTRODUODENOSCOPY (EGD) WITH PROPOFOL;  Surgeon: Arta Silence, MD;  Location: [facility];  Service: Endoscopy;  Laterality: N/A;  . Colonoscopy with propofol N/A 02/07/2013    Procedure: COLONOSCOPY WITH PROPOFOL;  Surgeon: Arta Silence, MD;  Location: [facility];  Service: Endoscopy;  Laterality: N/A;  . Incision and drainage mouth  02/2013    " jaw area"  . Wisdom tooth extraction  ~  1997  . Anterior cervical decomp/discectomy fusion N/A 05/30/2013    Procedure: Cervical five-six anterior cervical decompression with interbody prosthesis plating and bonegraft;  Surgeon: Ophelia Charter, MD;  Location: [place] [facility];  Service: Neurosurgery;  Laterality: N/A;  Cervical five-six anterior cervical decompression with interbody prosthesis plating and bonegraft  . Esophagogastroduodenoscopy N/A 10/19/2013    Procedure: ESOPHAGOGASTRODUODENOSCOPY (EGD);  Surgeon: Jeryl Columbia, MD;  Location[facility] END[facility]COPY;  Service: Endoscopy;  Laterality: N/A;  . Esophagogastroduodenoscopy (egd) with propofol N/A 03/06/2014    Procedure: ESOPHAGOGASTRODUODENOSCOPY (EGD) WITH PROPOFOL;  Surgeon: Arta Silence, MD;  Location: [facility];  Service: Endoscopy;  Laterality: N/A;  . Eus N/A 03/06/2014    Procedure: ESOPHAGEAL ENDOSCOPIC ULTRASOUND (EUS) RADIAL;  Surgeon: Arta Silence, MD;  Location: [facility];  Service: Endoscopy;  Laterality: N/A;  . Colonoscopy  with propofol N/A 03/06/2014    Procedure: COLONOSCOPY WITH PROPOFOL;  Surgeon: Arta Silence, MD;  Location: [facility];  Service: Endoscopy;  Laterality: N/A;    There were no vitals filed for this visit.  Visit Diagnosis:  Decreased ROM of neck  Cervicalgia      Subjective Assessment - 07/07/15 1620    Subjective Pt. reports upper neck pain as a 2/10 tightness / pain currently.     Patient Stated Goals "To move neck without pain/stiffness"   Currently in Pain? Yes   Pain Score 2    Pain Location Neck   Pain Orientation  Left;Lateral   Pain Descriptors / Indicators Burning   Pain Type Chronic pain   Pain Onset More than a month ago   Pain Frequency Intermittent   Aggravating Factors  driving fork lift   Multiple Pain Sites No     Today's treatment:   Therex: UBE 90" forward / 90" backward  Manual: Prone R UT TPR / STM x 5 min   Therex: Standing chest stretch on door seal 3 way x 30 sec each While lying on half white foam roll ~ 2"      Supine chest stretch x 1 min           Alternating flexion / extension with blue TB 2 x 15 each way       B shoulder abduction with shoulder blade "squeeze" with blue TB 2 x 15 reps  Neck retraction 5 reps 10 sec hold Standing B Shoulder extension with green TB 2 x 10 reps  Standing B row back with black TB 2 x 15 reps  Low row on BATCA #20 x 15 reps  Low row single arm on BATCA #10 x 15 reps each arm         PT Short Term Goals - 07/03/15 1616    PT SHORT TERM GOAL #1   Title Pt will be independent with inital HEP by 07/08/15   Status On-going           PT Long Term Goals - 07/03/15 1616    PT LONG TERM GOAL #1   Title Pt will be independent with latest HEP by 08/05/15   Status On-going   PT LONG TERM GOAL #2   Title Pt will demonstrate improved cervical extension and bilateral rotation by at least 10 degrees by 08/05/15   Status On-going   PT LONG TERM GOAL #3   Title Pt will report no further radicular symptoms in L UE by 08/05/15   Status On-going   PT LONG TERM GOAL #4   Title Pt will report no further "pressure headaches" by 08/05/15   Status On-going   PT LONG TERM GOAL #5   Title 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 tolerated increased repetitions with scapular strengthening activities and progression to chest stretch in supine lying on half white bolster well today with no  increased pain.  Full HEP review not performed today; pt. would benefit from comprehensive HEP review with addition of exercises as needed.      PT Next Visit Plan Review HEP and postural education; Postural training; Cervical and thoracic ROM and flexibility; Manual therapy and modalities PRN  Problem List Patient Active Problem List   Diagnosis Date Noted  . Obesity 06/18/2015  . Metabolic syndrome [id number]  . Hematemesis 10/18/2013  . GERD (gastroesophageal reflux disease)   . Esophageal varices ([place])   . Cervical herniated disc 05/30/2013  . ITP (idiopathic thrombocytopenic purpura) 05/16/2013  . insulin-dependent diabetes mellitus 04/27/2013  . Habitual alcohol use 02/07/2013  . Other pancytopenia ([place]) 06/15/2012  . Anemia 05/27/2012  . Hypertension 02/21/2011  . Hyperlipidemia 02/21/2011  . Fatty liver 02/21/2011  . Elevated liver enzymes 02/21/2011  . GE reflux 02/21/2011    Bess Harvest, PTA 07/07/2015, 6:27 PM  [facility] [address]  [place], [place], [address] Phone: [phone]   Fax:  [phone]  Name: Oscar [name] MRN: [id number] Date of Birth: 12-Nov-1965

## 2015-07-10 ENCOUNTER — Ambulatory Visit: Payer: BLUE CROSS/BLUE SHIELD

## 2015-07-10 DIAGNOSIS — M542 Cervicalgia: Secondary | ICD-10-CM | POA: Diagnosis not present

## 2015-07-10 DIAGNOSIS — R29898 Other symptoms and signs involving the musculoskeletal system: Secondary | ICD-10-CM

## 2015-07-10 NOTE — Therapy (Signed)
[facility] [address]  [place], [place], [address] Phone: [phone]   Fax:  [phone]  Physical Therapy Treatment  Patient Details  Name: Oscar [name] MRN: [id number] Date of Birth: 04-28-1965 Referring Provider: Newman Pies, MD  Encounter Date: 07/10/2015      PT End of Session - 07/10/15 1553    Visit Number 4   Number of Visits 12   Date for PT Re-Evaluation 08/05/15   PT Start Time 1536   PT Stop Time 1615   PT Time Calculation (min) 39 min   Activity Tolerance Patient tolerated treatment well   Behavior During Therapy [facility] for tasks assessed/performed      Past Medical History  Diagnosis Date  . Fatty liver   . Obesity   . ITP (idiopathic thrombocytopenic purpura) 05/16/2013  . History of colon polyps   . Hypertension     takes Atenolol,HCTZ,and Lisinopril daily  . Gastric varices     takes Propranlol for this  . Numbness hands and fingers    and tingling  . Neck pain     HNP and radiculpathy  . GERD (gastroesophageal reflux disease)     takes Nexium daily  . GI bleed june 2015  . Esophageal varices ([place])   . Urinary frequency   . Constipation     related to pain meds and takes Colace daily  . Insomnia     but doesn't take any meds  . History of staph infection 2014  . Sleep apnea     sleep study in epic from 2006 but doesn't use a cpap (10/18/2013)  . Type II diabetes mellitus (HCC)     takes Novolin and Novolog and takes Janumet daily  . [id number])     "weekly" (10/18/2013)  . Osteoarthritis     "hands; elbows; knees" (10/18/2013)  . Hematemesis 10/18/2013    hospitalized  . Hepatitis yrs ago    hx of Hep A  . Pancytopenia ([place])     but never had a blood transfusion, had to take iron transfusion  sept 2014    Past Surgical History  Procedure Laterality Date  . Esophagogastroduodenoscopy (egd) with propofol N/A 02/07/2013    Procedure:  ESOPHAGOGASTRODUODENOSCOPY (EGD) WITH PROPOFOL;  Surgeon: Arta Silence, MD;  [facility];  Service: Endoscopy;  Laterality: N/A;  . Colonoscopy with propofol N/A 02/07/2013    Procedure: COLONOSCOPY WITH PROPOFOL;  Surgeon: Arta Silence, MD;  [facility];  Service: Endoscopy;  Laterality: N/A;  . Incision and drainage mouth  02/2013    " jaw area"  . Wisdom tooth extraction  ~  1997  . Anterior cervical decomp/discectomy fusion N/A 05/30/2013    Procedure: Cervical five-six anterior cervical decompression with interbody prosthesis plating and bonegraft;  Surgeon: Ophelia Charter, MD;  Location: [place] [facility];  Service: Neurosurgery;  Laterality: N/A;  Cervical five-six anterior cervical decompression with interbody prosthesis plating and bonegraft  . Esophagogastroduodenoscopy N/A 10/19/2013    Procedure: ESOPHAGOGASTRODUODENOSCOPY (EGD);  Surgeon: Jeryl Columbia, MD;  Location: [facility] ENDOSCOPY;  Service: Endoscopy;  Laterality: N/A;  . Esophagogastroduodenoscopy (egd) with propofol N/A 03/06/2014    Procedure: ESOPHAGOGASTRODUODENOSCOPY (EGD) WITH PROPOFOL;  Surgeon: Arta Silence, MD;  [facility];  Service: Endoscopy;  Laterality: N/A;  . Eus N/A 03/06/2014    Procedure: ESOPHAGEAL ENDOSCOPIC ULTRASOUND (EUS) RADIAL;  Surgeon: Arta Silence, MD;  [facility];  Service: Endoscopy;  Laterality: N/A;  . Colonoscopy  with propofol N/A 03/06/2014    Procedure: COLONOSCOPY WITH PROPOFOL;  Surgeon: Arta Silence, MD;  [facility];  Service: Endoscopy;  Laterality: N/A;    There were no vitals filed for this visit.  Visit Diagnosis:  Cervicalgia  Decreased ROM of neck      Subjective Assessment - 07/10/15 1543    Subjective pt. report no pain or complaints currently.     Currently in Pain? No/denies   Multiple Pain Sites No      Today's treatment:   Manual: Prone R UT TPR / STM x 5 min   Therex: Standing chest stretch on door  seal 3 way x 30 sec each While on white foam roll ~ 6"  Supine chest stretch x 1 min   Alternating flexion / extension with black TB 2 x 15 each way   B shoulder abduction with shoulder blade "squeeze" with black TB 2 x 15 reps  Neck retraction 5 reps 10 sec hold Standing B Shoulder extension with blue TB 2 x 10 reps  Standing B row back with black TB 2 x 15 reps  Low row on BATCA #20 x 15 reps  Low row single arm on BATCA #10 x 15 reps each arm          PT Short Term Goals - 07/10/15 1554    PT SHORT TERM GOAL #1   Title Pt will be independent with inital HEP by 07/08/15  07/10/15: Pt. ind. with initial HEP.   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 with 0/10 initially today.  Pt. tolerated treatment focused on scapular strengthening and anterior chest stretching well with no pain.  STM performed to pt. L UT / suboccipitals following pt. report of increased L neck "tightness"; pt. reported decreased "tightness" following massage.     PT Next Visit Plan Review HEP and postural education; Postural training; Cervical and thoracic ROM and flexibility; Manual therapy and modalities PRN        Problem List Patient Active Problem List    Diagnosis Date Noted  . Obesity 06/18/2015  . Metabolic syndrome [id number]  . Hematemesis 10/18/2013  . GERD (gastroesophageal reflux disease)   . Esophageal varices ([place])   . Cervical herniated disc 05/30/2013  . ITP (idiopathic thrombocytopenic purpura) 05/16/2013  . insulin-dependent diabetes mellitus 04/27/2013  . Habitual alcohol use 02/07/2013  . Other pancytopenia ([place]) 06/15/2012  . Anemia 05/27/2012  . Hypertension 02/21/2011  . Hyperlipidemia 02/21/2011  . Fatty liver 02/21/2011  .  Elevated liver enzymes 02/21/2011  . GE reflux 02/21/2011    Bess Harvest, PTA 07/10/2015, 4:22 PM  [facility] [address]  [place], [place], [address] Phone: [phone]   Fax:  [phone]  Name: Oscar [name] MRN: [id number] Date of Birth: [birth year]

## 2015-07-14 ENCOUNTER — Ambulatory Visit: Payer: BLUE CROSS/BLUE SHIELD | Admitting: Physical Therapy

## 2015-07-14 DIAGNOSIS — R29898 Other symptoms and signs involving the musculoskeletal system: Secondary | ICD-10-CM

## 2015-07-14 DIAGNOSIS — M542 Cervicalgia: Secondary | ICD-10-CM

## 2015-07-14 NOTE — Therapy (Signed)
[facility] [address]  [place], [place], [address] Phone: [phone]   Fax:  [phone]  Physical Therapy Treatment  Patient Details  Name: Oscar [name] MRN: [id number] Date of Birth: August 15, 1965 Referring Provider: Newman Pies, MD  Encounter Date: 07/14/2015      PT End of Session - 07/14/15 1545    Visit Number 5   Number of Visits 12   Date for PT Re-Evaluation 08/05/15   PT Start Time [id number]   PT Stop Time 1632   PT Time Calculation (min) 54 min   Activity Tolerance Patient tolerated treatment well   Behavior During Therapy [facility] for tasks assessed/performed      Past Medical History  Diagnosis Date  . Fatty liver   . Obesity   . ITP (idiopathic thrombocytopenic purpura) 05/16/2013  . History of colon polyps   . Hypertension     takes Atenolol,HCTZ,and Lisinopril daily  . Gastric varices     takes Propranlol for this  . Numbness hands and fingers    and tingling  . Neck pain     HNP and radiculpathy  . GERD (gastroesophageal reflux disease)     takes Nexium daily  . GI bleed june 2015  . Esophageal varices ([place])   . Urinary frequency   . Constipation     related to pain meds and takes Colace daily  . Insomnia     but doesn't take any meds  . History of staph infection 2014  . Sleep apnea     sleep study in epic from 2006 but doesn't use a cpap (10/18/2013)  . Type II diabetes mellitus (HCC)     takes Novolin and Novolog and takes Janumet daily  . [id number])     "weekly" (10/18/2013)  . Osteoarthritis     "hands; elbows; knees" (10/18/2013)  . Hematemesis 10/18/2013    hospitalized  . Hepatitis yrs ago    hx of Hep A  . Pancytopenia ([place])     but never had a blood transfusion, had to take iron transfusion  sept 2014    Past Surgical History  Procedure Laterality Date  . Esophagogastroduodenoscopy (egd) with propofol N/A 02/07/2013    Procedure:  ESOPHAGOGASTRODUODENOSCOPY (EGD) WITH PROPOFOL;  Surgeon: Arta Silence, MD;  [facility];  Service: Endoscopy;  Laterality: N/A;  . Colonoscopy with propofol N/A 02/07/2013    Procedure: COLONOSCOPY WITH PROPOFOL;  Surgeon: Arta Silence, MD;  [facility];  Service: Endoscopy;  Laterality: N/A;  . Incision and drainage mouth  02/2013    " jaw area"  . Wisdom tooth extraction  ~  1997  . Anterior cervical decomp/discectomy fusion N/A 05/30/2013    Procedure: Cervical five-six anterior cervical decompression with interbody prosthesis plating and bonegraft;  Surgeon: Ophelia Charter, MD;  Location: [place] [facility];  Service: Neurosurgery;  Laterality: N/A;  Cervical five-six anterior cervical decompression with interbody prosthesis plating and bonegraft  . Esophagogastroduodenoscopy N/A 10/19/2013    Procedure: ESOPHAGOGASTRODUODENOSCOPY (EGD);  Surgeon: Jeryl Columbia, MD;  Location: [facility] ENDOSCOPY;  Service: Endoscopy;  Laterality: N/A;  . Esophagogastroduodenoscopy (egd) with propofol N/A 03/06/2014    Procedure: ESOPHAGOGASTRODUODENOSCOPY (EGD) WITH PROPOFOL;  Surgeon: Arta Silence, MD;  [facility];  Service: Endoscopy;  Laterality: N/A;  . Eus N/A 03/06/2014    Procedure: ESOPHAGEAL ENDOSCOPIC ULTRASOUND (EUS) RADIAL;  Surgeon: Arta Silence, MD;  [facility];  Service: Endoscopy;  Laterality: N/A;  . Colonoscopy  with propofol N/A 03/06/2014    Procedure: COLONOSCOPY WITH PROPOFOL;  Surgeon: Arta Silence, MD;  [facility];  Service: Endoscopy;  Laterality: N/A;    There were no vitals filed for this visit.  Visit Diagnosis:  Cervicalgia  Decreased ROM of neck      Subjective Assessment - 07/14/15 1543    Subjective Pt reports he had a very busy weekend and his pain was up to 5/10 earlier today, but has eased off some at this point.   Currently in Pain? Yes   Pain Score 2    Pain Location Neck  Upper shoulder   Pain Orientation  Left;Lateral   Pain Descriptors / Indicators Tingling  Grabbing          Today's treatment   TherEx UBE L 2.5 - 2' forward / 2' backward  Manual: STM & TPR to L UT  Manual stretch to L UT, levator and SCM in supine  TherEx Hooklying on 6" foam roll    Supine chest stretch x 2'  B Shoulder Horiz ABD+ Scapular retraction with black TB 2x15    Alternating Shoulder flexion / extension with black TB 2 x 15 each way  B Shoulder ER + Scapular retraction with black TB x15    B Shoulder flexion pullover with 5# x10    Serratus punch 5# x10 Prone over corner of mat table    "I" + thoracic extension x10    "T" + thoracic extension x10    "W" + thoracic extension x10 Low row on BATCA #25 x15, 35# x15 Low row single arm on BATCA #15 x15 each arm Doorway stretch low/mid/hi x30" each Standing B Scapular retraction + Shoulder extension to neutral black TB 2 x 10 reps  Standing B row back with black TB 2 x 15 reps   Pt noting increased pain at end of session which apparently started during single arm row but not reported at the time  Modalities Estim - IFC to L UT/levator (80-150 Hz) x 10' Ice pack to L UT/levator x 10'          PT Short Term Goals - 07/10/15 1554    PT SHORT TERM GOAL #1   Title Pt will be independent with inital HEP by 07/08/15  07/10/15: Pt. ind. with initial HEP.   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 Improving shoulder posture noted but  persists with forward head, requiring frequent cues during exercises to maintain good alignment. Increased pain noted at end of session which pt attributed to single arm rows but not reported at the time, therefore initiated trial of estim with ice pack in effort to reduce pain.   PT Next Visit Plan Review HEP and postural education; Postural training; Cervical and thoracic ROM and flexibility; Manual therapy and modalities PRN   Consulted and Agree with Plan of Care Patient        Problem List Patient Active Problem List   Diagnosis Date Noted  . Obesity 06/18/2015  . Metabolic syndrome [id number]  . Hematemesis 10/18/2013  . GERD (gastroesophageal reflux disease)   . Esophageal varices ([place])   . Cervical herniated disc 05/30/2013  . ITP (idiopathic thrombocytopenic purpura) 05/16/2013  . insulin-dependent diabetes mellitus 04/27/2013  . Habitual alcohol use 02/07/2013  . Other pancytopenia ([place]) 06/15/2012  . Anemia 05/27/2012  . Hypertension 02/21/2011  . Hyperlipidemia 02/21/2011  . Fatty liver 02/21/2011  . Elevated liver enzymes 02/21/2011  . GE reflux 02/21/2011    Percival Spanish, PT, MPT 07/14/2015, 5:56 PM  [facility] [address]  [place], [place], [address] Phone: [phone]   Fax:  [phone]  Name: DAMARQUIS RUMLEY MRN: [id number] Date of Birth: 03-Jul-1965

## 2015-07-17 ENCOUNTER — Ambulatory Visit: Payer: BLUE CROSS/BLUE SHIELD

## 2015-07-17 DIAGNOSIS — R29898 Other symptoms and signs involving the musculoskeletal system: Secondary | ICD-10-CM

## 2015-07-17 DIAGNOSIS — M542 Cervicalgia: Secondary | ICD-10-CM | POA: Diagnosis not present

## 2015-07-17 NOTE — Therapy (Signed)
[facility] [address]  [place], [place], [address] Phone: [phone]   Fax:  [phone]  Physical Therapy Treatment  Patient Details  Name: Oscar [name] MRN: [id number] Date of Birth: [birth year] Referring Provider: Newman Pies, MD  Encounter Date: 07/17/2015      PT End of Session - 07/17/15 1604    Visit Number 6   Number of Visits 12   Date for PT Re-Evaluation 08/05/15   PT Start Time [id number]   PT Stop Time 1610   PT Time Calculation (min) 40 min   Activity Tolerance Patient tolerated treatment well   Behavior During Therapy [facility] for tasks assessed/performed      Past Medical History  Diagnosis Date  . Fatty liver   . Obesity   . ITP (idiopathic thrombocytopenic purpura) 05/16/2013  . History of colon polyps   . Hypertension     takes Atenolol,HCTZ,and Lisinopril daily  . Gastric varices     takes Propranlol for this  . Numbness hands and fingers    and tingling  . Neck pain     HNP and radiculpathy  . GERD (gastroesophageal reflux disease)     takes Nexium daily  . GI bleed june 2015  . Esophageal varices ([place])   . Urinary frequency   . Constipation     related to pain meds and takes Colace daily  . Insomnia     but doesn't take any meds  . History of staph infection 2014  . Sleep apnea     sleep study in epic from 2006 but doesn't use a cpap (10/18/2013)  . Type II diabetes mellitus (HCC)     takes Novolin and Novolog and takes Janumet daily  . [id number])     "weekly" (10/18/2013)  . Osteoarthritis     "hands; elbows; knees" (10/18/2013)  . Hematemesis 10/18/2013    hospitalized  . Hepatitis yrs ago    hx of Hep A  . Pancytopenia ([place])     but never had a blood transfusion, had to take iron transfusion  sept 2014    Past Surgical History  Procedure Laterality Date  . Esophagogastroduodenoscopy (egd) with propofol N/A 02/07/2013    Procedure:  ESOPHAGOGASTRODUODENOSCOPY (EGD) WITH PROPOFOL;  Surgeon: Arta Silence, MD;  [facility];  Service: Endoscopy;  Laterality: N/A;  . Colonoscopy with propofol N/A 02/07/2013    Procedure: COLONOSCOPY WITH PROPOFOL;  Surgeon: Arta Silence, MD;  [facility];  Service: Endoscopy;  Laterality: N/A;  . Incision and drainage mouth  02/2013    " jaw area"  . Wisdom tooth extraction  ~  1997  . Anterior cervical decomp/discectomy fusion N/A 05/30/2013    Procedure: Cervical five-six anterior cervical decompression with interbody prosthesis plating and bonegraft;  Surgeon: Ophelia Charter, MD;  Location: [place] [facility];  Service: Neurosurgery;  Laterality: N/A;  Cervical five-six anterior cervical decompression with interbody prosthesis plating and bonegraft  . Esophagogastroduodenoscopy N/A 10/19/2013    Procedure: ESOPHAGOGASTRODUODENOSCOPY (EGD);  Surgeon: Jeryl Columbia, MD;  Location[facility] END[facility]COPY;  Service: Endoscopy;  Laterality: N/A;  . Esophagogastroduodenoscopy (egd) with propofol N/A 03/06/2014    Procedure: ESOPHAGOGASTRODUODENOSCOPY (EGD) WITH PROPOFOL;  Surgeon: Arta Silence, MD;  [facility];  Service: Endoscopy;  Laterality: N/A;  . Eus N/A 03/06/2014    Procedure: ESOPHAGEAL ENDOSCOPIC ULTRASOUND (EUS) RADIAL;  Surgeon: Arta Silence, MD;  [facility];  Service: Endoscopy;  Laterality: N/A;  . Colonoscopy  with propofol N/A 03/06/2014    Procedure: COLONOSCOPY WITH PROPOFOL;  Surgeon: Arta Silence, MD;  [facility];  Service: Endoscopy;  Laterality: N/A;    There were no vitals filed for this visit.  Visit Diagnosis:  Decreased ROM of neck  Cervicalgia      Subjective Assessment - 07/17/15 1602    Subjective No pain or other complaints currently.   Patient Stated Goals "To move neck without pain/stiffness"   Currently in Pain? No/denies   Pain Score 0-No pain   Multiple Pain Sites No      Today's treatment    TherEx UBE L 1.5 - 1' forward / 1' backward  Manual: STM & TPR to L UT  Manual stretch to L UT, levator and SCM in supine  TherEx Hooklying on 6" foam roll  Supine chest stretch x 2'  B Shoulder Horiz ABD+ Scapular retraction with black TB 2x15  Alternating Shoulder flexion / extension with blue TB 2 x 15 each way  B Shoulder flexion pullover with 6# x10  Serratus B punch 6# x10 Prone over corner of mat table  "I" + thoracic extension x10  "T" + thoracic extension x10  "Y" + thoracic extension x10 Low row on BATCA, 35# x 15reps  Low row single arm on BATCA #20 x15 each arm TRX low/mid/hi x30" each Standing B Scapular retraction + Shoulder extension to neutral black TB 2 x 10 reps  Standing B row back with black TB 2 x 15 reps         PT Short Term Goals - 07/10/15 1554    PT SHORT TERM GOAL #1   Title Pt will be independent with inital HEP by 07/08/15  07/10/15: Pt. ind. with initial HEP.   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 with initial 0/10 neck / upper back pain.  Pt. tolerated all scapular and thoracic strengthening activity well with no neck / upper back pain reported.  Pt. now reports only occasional neck  pain with certain movements or overexertion.    PT Next Visit Plan Review HEP and postural education; Postural training; Cervical and thoracic ROM and flexibility; Manual therapy and modalities PRN   Consulted and Agree with Plan of Care --        Problem List Patient Active Problem List   Diagnosis Date Noted  . Obesity 06/18/2015  . Metabolic syndrome [id number]  . Hematemesis 10/18/2013  . GERD (gastroesophageal reflux disease)   . Esophageal varices ([place])   .  Cervical herniated disc 05/30/2013  . ITP (idiopathic thrombocytopenic purpura) 05/16/2013  . insulin-dependent diabetes mellitus 04/27/2013  . Habitual alcohol use 02/07/2013  . Other pancytopenia ([place]) 06/15/2012  . Anemia 05/27/2012  . Hypertension 02/21/2011  . Hyperlipidemia 02/21/2011  . Fatty liver 02/21/2011  . Elevated liver enzymes 02/21/2011  . GE reflux 02/21/2011    Bess Harvest, PTA 07/17/2015, 4:13 PM  [facility] [address]  [place], [place], [address] Phone: [phone]   Fax:  [phone]  Name: Oscar [name] MRN: [id number] Date of Birth: Sep 23, 1965

## 2015-07-21 ENCOUNTER — Ambulatory Visit: Payer: BLUE CROSS/BLUE SHIELD | Admitting: Physical Therapy

## 2015-07-21 DIAGNOSIS — M542 Cervicalgia: Secondary | ICD-10-CM | POA: Diagnosis not present

## 2015-07-21 DIAGNOSIS — R29898 Other symptoms and signs involving the musculoskeletal system: Secondary | ICD-10-CM

## 2015-07-21 NOTE — Therapy (Signed)
[facility] [address]  [place], [place], [address] Phone: [phone]   Fax:  [phone]  Physical Therapy Treatment  Patient Details  Name: Oscar [name] MRN: [id number] Date of Birth: 1965-05-11 Referring Provider: Newman Pies, MD  Encounter Date: 07/21/2015      PT End of Session - 07/21/15 1537    Visit Number 7   Number of Visits 12   Date for PT Re-Evaluation 08/05/15   PT Start Time 2876   PT Stop Time 1616   PT Time Calculation (min) 43 min   Activity Tolerance Patient tolerated treatment well   Behavior During Therapy [facility] for tasks assessed/performed      Past Medical History  Diagnosis Date  . Fatty liver   . Obesity   . ITP (idiopathic thrombocytopenic purpura) 05/16/2013  . History of colon polyps   . Hypertension     takes Atenolol,HCTZ,and Lisinopril daily  . Gastric varices     takes Propranlol for this  . Numbness hands and fingers    and tingling  . Neck pain     HNP and radiculpathy  . GERD (gastroesophageal reflux disease)     takes Nexium daily  . GI bleed june 2015  . Esophageal varices ([place])   . Urinary frequency   . Constipation     related to pain meds and takes Colace daily  . Insomnia     but doesn't take any meds  . History of staph infection 2014  . Sleep apnea     sleep study in epic from 2006 but doesn't use a cpap (10/18/2013)  . Type II diabetes mellitus (HCC)     takes Novolin and Novolog and takes Janumet daily  . OTLXBWIO(035.5)     "weekly" (10/18/2013)  . Osteoarthritis     "hands; elbows; knees" (10/18/2013)  . Hematemesis 10/18/2013    hospitalized  . Hepatitis yrs ago    hx of Hep A  . Pancytopenia ([place])     but never had a blood transfusion, had to take iron transfusion  sept 2014    Past Surgical History  Procedure Laterality Date  . Esophagogastroduodenoscopy (egd) with propofol N/A 02/07/2013    Procedure:  ESOPHAGOGASTRODUODENOSCOPY (EGD) WITH PROPOFOL;  Surgeon: Arta Silence, MD;  [facility]: [facility];  Service: Endoscopy;  Laterality: N/A;  . Colonoscopy with propofol N/A 02/07/2013    Procedure: COLONOSCOPY WITH PROPOFOL;  Surgeon: Arta Silence, MD;  Location: [facility]OS[facility];  Service: Endoscopy;  Laterality: N/A;  . Incision and drainage mouth  02/2013    " jaw area"  . Wisdom tooth extraction  ~  1997  . Anterior cervical decomp/discectomy fusion N/A 05/30/2013    Procedure: Cervical five-six anterior cervical decompression with interbody prosthesis plating and bonegraft;  Surgeon: Ophelia Charter, MD;  Location: [place] NEURO ORS;  Service: Neurosurgery;  Laterality: N/A;  Cervical five-six anterior cervical decompression with interbody prosthesis plating and bonegraft  . Esophagogastroduodenoscopy N/A 10/19/2013    Procedure: ESOPHAGOGASTRODUODENOSCOPY (EGD);  Surgeon: Jeryl Columbia, MD;  [facility];  Service: Endoscopy;  Laterality: N/A;  . Esophagogastroduodenoscopy (egd) with propofol N/A 03/06/2014    Procedure: ESOPHAGOGASTRODUODENOSCOPY (EGD) WITH PROPOFOL;  Surgeon: Arta Silence, MD;  [facility];  Service: Endoscopy;  Laterality: N/A;  . Eus N/A 03/06/2014    Procedure: ESOPHAGEAL ENDOSCOPIC ULTRASOUND (EUS) RADIAL;  Surgeon: Arta Silence, MD;  [facility];  Service: Endoscopy;  Laterality: N/A;  . Colonoscopy  with propofol N/A 03/06/2014    Procedure: COLONOSCOPY WITH PROPOFOL;  Surgeon: Arta Silence, MD;  [facility];  Service: Endoscopy;  Laterality: N/A;    There were no vitals filed for this visit.  Visit Diagnosis:  Decreased ROM of neck  Cervicalgia      Subjective Assessment - 07/21/15 1537    Subjective "Feeling pretty good."   Currently in Pain? No/denies            [facility] PT Assessment - 07/21/15 1533    AROM   AROM Assessment Site Cervical   Cervical Flexion 45   Cervical Extension 46   Cervical - Right  Side Bend 38   Cervical - Left Side Bend 38   Cervical - Right Rotation 55   Cervical - Left Rotation 60   Strength   Right Hand Gross Grasp Functional   Right Hand Grip (lbs) 63   Left Hand Gross Grasp Functional   Left Hand Grip (lbs) 56          Today's treatment   TherEx UBE L 2.5 - 1' forward / 1' backward   ROM check  Manual: STM & TPR to L levator & pec Manual stretch to L UT, levator and SCM in supine PROM cervical spine in all planes  TherEx Prone over corner of mat table  "I" + thoracic extension x10  "T" + thoracic extension x10  "Y" + thoracic extension x10    "W" + thoracic extension x10 "W" Row with blue TB x10 TRX Low Row emphasizing neutral spine posture x10 Wall push-ups x10         PT Education - 07/21/15 1822    Education provided Yes   Education Details HEP update - Prone scapular exercises to be performed over ottoman or corner of bed   Person(s) Educated Patient   Methods Explanation;Demonstration;Verbal cues;Handout   Comprehension Verbalized understanding;Returned demonstration;Verbal cues required          PT Short Term Goals - 07/10/15 1554    PT SHORT TERM GOAL #1   Title Pt will be independent with inital HEP by 07/08/15  07/10/15: Pt. ind. with initial HEP.   Status Achieved           PT Long Term Goals - 07/21/15 1545    PT LONG TERM GOAL #1   Title Pt will be independent with latest HEP by 08/05/15   Status On-going   PT LONG TERM GOAL #2   Title Pt will demonstrate improved cervical extension and bilateral rotation by at least 10 degrees by 08/05/15   Status Partially Met  Met for extension & L rotation, ongoing for R rotation   PT LONG TERM GOAL #3   Title Pt will report no further radicular symptoms in L UE by 08/05/15   Status Achieved   PT LONG TERM GOAL #4   Title Pt will report no further "pressure headaches" by 08/05/15   Status On-going  Still reporting occasional headaches, but thinks they may be  more from stress at this point   PT LONG TERM GOAL #5   Title 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 Cervical ROM improved in all planes, although cervical AROM remains limited relative to available PROM, especially in rotation bilaterally. HEP updated to include prone scapular retraction exercises with emphasis on thoracic and cervical extension.   PT Next Visit Plan Review updated HEP and postural education; Postural training; Cervical and thoracic ROM and flexibility; Manual therapy and modalities PRN   Consulted and Agree with Plan of Care Patient        Problem List Patient Active Problem List   Diagnosis Date Noted  . Obesity 06/18/2015  . Metabolic syndrome 75/64/3329  . Hematemesis 10/18/2013  . GERD (gastroesophageal reflux disease)   . Esophageal varices ([place])   . Cervical herniated disc 05/30/2013  . ITP (idiopathic thrombocytopenic purpura) 05/16/2013  . insulin-dependent diabetes mellitus 04/27/2013  . Habitual alcohol use 02/07/2013  . Other pancytopenia ([place]) 06/15/2012  . Anemia 05/27/2012  . Hypertension 02/21/2011  . Hyperlipidemia 02/21/2011  . Fatty liver 02/21/2011  . Elevated liver enzymes 02/21/2011  . GE reflux 02/21/2011    Percival Spanish, PT, MPT 07/21/2015, 6:25 PM  [facility] [address]  [place], [place], [address] Phone: [phone]   Fax:  [phone]  Name: REYMUNDO WINSHIP MRN: [id number] Date of Birth: 1966-01-21

## 2015-07-24 ENCOUNTER — Ambulatory Visit: Payer: BLUE CROSS/BLUE SHIELD

## 2015-07-28 ENCOUNTER — Ambulatory Visit: Payer: BLUE CROSS/BLUE SHIELD | Attending: Neurosurgery | Admitting: Physical Therapy

## 2015-07-28 DIAGNOSIS — R29898 Other symptoms and signs involving the musculoskeletal system: Secondary | ICD-10-CM | POA: Insufficient documentation

## 2015-07-28 DIAGNOSIS — M542 Cervicalgia: Secondary | ICD-10-CM | POA: Diagnosis not present

## 2015-07-28 DIAGNOSIS — M436 Torticollis: Secondary | ICD-10-CM | POA: Diagnosis not present

## 2015-07-28 NOTE — Therapy (Signed)
[facility] [address]  [place], [place], [address] Phone: [phone]   Fax:  [phone]  Physical Therapy Treatment  Patient Details  Name: Oscar [name] MRN: [id number] Date of Birth: 1965/12/27 Referring Provider: Newman Pies, MD  Encounter Date: 07/28/2015      PT End of Session - 07/28/15 1549    Visit Number 8   Number of Visits 12   Date for PT Re-Evaluation 08/05/15   PT Start Time 3903   PT Stop Time 1617   PT Time Calculation (min) 38 min   Activity Tolerance Patient tolerated treatment well   Behavior During Therapy [facility] for tasks assessed/performed      Past Medical History  Diagnosis Date  . Fatty liver   . Obesity   . ITP (idiopathic thrombocytopenic purpura) 05/16/2013  . History of colon polyps   . Hypertension     takes Atenolol,HCTZ,and Lisinopril daily  . Gastric varices     takes Propranlol for this  . Numbness hands and fingers    and tingling  . Neck pain     HNP and radiculpathy  . GERD (gastroesophageal reflux disease)     takes Nexium daily  . GI bleed june 2015  . Esophageal varices ([place])   . Urinary frequency   . Constipation     related to pain meds and takes Colace daily  . Insomnia     but doesn't take any meds  . History of staph infection 2014  . Sleep apnea     sleep study in epic from 2006 but doesn't use a cpap (10/18/2013)  . Type II diabetes mellitus (HCC)     takes Novolin and Novolog and takes Janumet daily  . ESPQZRAQ(762.2)     "weekly" (10/18/2013)  . Osteoarthritis     "hands; elbows; knees" (10/18/2013)  . Hematemesis 10/18/2013    hospitalized  . Hepatitis yrs ago    hx of Hep A  . Pancytopenia ([place])     but never had a blood transfusion, had to take iron transfusion  sept 2014    Past Surgical History  Procedure Laterality Date  . Esophagogastroduodenoscopy (egd) with propofol N/A 02/07/2013    Procedure: ESOPHAGOGASTRODUODENOSCOPY  (EGD) WITH PROPOFOL;  Surgeon: Arta Silence, MD;  [facility];  Service: Endoscopy;  Laterality: N/A;  . Colonoscopy with propofol N/A 02/07/2013    Procedure: COLONOSCOPY WITH PROPOFOL;  Surgeon: Arta Silence, MD;  [facility];  Service: Endoscopy;  Laterality: N/A;  . Incision and drainage mouth  02/2013    " jaw area"  . Wisdom tooth extraction  ~  1997  . Anterior cervical decomp/discectomy fusion N/A 05/30/2013    Procedure: Cervical five-six anterior cervical decompression with interbody prosthesis plating and bonegraft;  Surgeon: Ophelia Charter, MD;  Location: [place] [facility];  Service: Neurosurgery;  Laterality: N/A;  Cervical five-six anterior cervical decompression with interbody prosthesis plating and bonegraft  . Esophagogastroduodenoscopy N/A 10/19/2013    Procedure: ESOPHAGOGASTRODUODENOSCOPY (EGD);  Surgeon: Jeryl Columbia, MD;  Location: [facility] ENDOSCOPY;  Service: Endoscopy;  Laterality: N/A;  . Esophagogastroduodenoscopy (egd) with propofol N/A 03/06/2014    Procedure: ESOPHAGOGASTRODUODENOSCOPY (EGD) WITH PROPOFOL;  Surgeon: Arta Silence, MD;  [facility];  Service: Endoscopy;  Laterality: N/A;  . Eus N/A 03/06/2014    Procedure: ESOPHAGEAL ENDOSCOPIC ULTRASOUND (EUS) RADIAL;  Surgeon: Arta Silence, MD;  [facility];  Service: Endoscopy;  Laterality: N/A;  . Colonoscopy  with propofol N/A 03/06/2014    Procedure: COLONOSCOPY WITH PROPOFOL;  Surgeon: Arta Silence, MD;  [facility];  Service: Endoscopy;  Laterality: N/A;    There were no vitals filed for this visit.  Visit Diagnosis:  Stiffness of neck  Cervicalgia      Subjective Assessment - 07/28/15 1547    Subjective Pt reports he "jacked up" his neck this weekend when he was leaning against the wall and slid - states radicular pain came back at that point but has since resolved and denies pain currenly.   Currently in Pain? No/denies          Today's  treatment   TherEx UBE L 2.5 - 1' forward / 1' backward Prone over green (65 cm) Pball:  "I" + thoracic extension 1# x10  "T" + thoracic extension 1# x10  "Y" + thoracic extension 1# x10  "W" + thoracic extension 1# x10 TRX Low Row emphasizing neutral spine posture x15 TRX Mid Row emphasizing neutral spine posture x15 Wall push-ups on green (65 cm) Pball x15 BATCA Low row 35# x15 BATCA Single UE Low row #20 x15 each arm Standing against 6" foam roll on wall:    B Shoulder Horiz ABD+ Scapular retraction with black TB 2x15  Alternating Shoulder flexion / extension with black TB x15 each way  B Shoulder ER + Scapular retraction with black TB x15   Manual STM and Manual stretch to L UT, levator and SCM in supine SOC and capital flexion stretch  PROM cervical spine in all planes          PT Short Term Goals - 07/10/15 1554    PT SHORT TERM GOAL #1   Title Pt will be independent with inital HEP by 07/08/15  07/10/15: Pt. ind. with initial HEP.   Status Achieved           PT Long Term Goals - 07/28/15 1755    PT LONG TERM GOAL #1   Title Pt will be independent with latest HEP by 08/05/15   Status On-going   PT LONG TERM GOAL #2   Title Pt will demonstrate improved cervical extension and bilateral rotation by at least 10 degrees by 08/05/15   Status Partially Met  Met for extension & L rotation, ongoing for R rotation   PT LONG TERM GOAL #3   Title Pt will report no further radicular symptoms in L UE by 08/05/15   Status Achieved   PT LONG TERM GOAL #4   Title Pt will report no further "pressure headaches" by 08/05/15   Status On-going  Still reporting occasional headaches, but thinks they may be more from stress at this point   PT LONG TERM GOAL #5   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 Progressed strengthening activities to more upright position with good tolerance and no c/o pain.    PT Next Visit Plan Review updated HEP and postural education; Postural training; Cervical and thoracic ROM and flexibility; Manual therapy and modalities PRN   Consulted and Agree with Plan of Care Patient  Problem List Patient Active Problem List   Diagnosis Date Noted  . Obesity 06/18/2015  . Metabolic syndrome 89/38/1017  . Hematemesis 10/18/2013  . GERD (gastroesophageal reflux disease)   . Esophageal varices ([place])   . Cervical herniated disc 05/30/2013  . ITP (idiopathic thrombocytopenic purpura) 05/16/2013  . insulin-dependent diabetes mellitus 04/27/2013  . Habitual alcohol use 02/07/2013  . Other pancytopenia ([place]) 06/15/2012  . Anemia 05/27/2012  . Hypertension 02/21/2011  . Hyperlipidemia 02/21/2011  . Fatty liver 02/21/2011  . Elevated liver enzymes 02/21/2011  . GE reflux 02/21/2011    Percival Spanish, PT, MPT 07/28/2015, 5:58 PM  [facility] [address]  [place], [place], [address] Phone: [phone]   Fax:  [phone]  Name: LAKYN MANTIONE MRN: [id number] Date of Birth: 1965/10/26

## 2015-07-31 ENCOUNTER — Ambulatory Visit: Payer: BLUE CROSS/BLUE SHIELD

## 2015-07-31 DIAGNOSIS — M542 Cervicalgia: Secondary | ICD-10-CM

## 2015-07-31 DIAGNOSIS — R29898 Other symptoms and signs involving the musculoskeletal system: Secondary | ICD-10-CM | POA: Diagnosis not present

## 2015-07-31 DIAGNOSIS — M436 Torticollis: Secondary | ICD-10-CM | POA: Diagnosis not present

## 2015-07-31 NOTE — Therapy (Addendum)
[facility] [address]  [place], [place], [address] Phone: [phone]   Fax:  [phone]  Physical Therapy Treatment  Patient Details  Name: Oscar [name] MRN: [id number] Date of Birth: 12-05-1965 Referring Provider: Newman Pies, MD  Encounter Date: 07/31/2015      PT End of Session - 07/31/15 1559    Visit Number 9   Number of Visits 12   Date for PT Re-Evaluation 08/05/15   PT Start Time 1532   PT Stop Time 1612   PT Time Calculation (min) 40 min   Activity Tolerance Patient tolerated treatment well   Behavior During Therapy [facility] for tasks assessed/performed      Past Medical History  Diagnosis Date  . Fatty liver   . Obesity   . ITP (idiopathic thrombocytopenic purpura) 05/16/2013  . History of colon polyps   . Hypertension     takes Atenolol,HCTZ,and Lisinopril daily  . Gastric varices     takes Propranlol for this  . Numbness hands and fingers    and tingling  . Neck pain     HNP and radiculpathy  . GERD (gastroesophageal reflux disease)     takes Nexium daily  . GI bleed june 2015  . Esophageal varices ([place])   . Urinary frequency   . Constipation     related to pain meds and takes Colace daily  . Insomnia     but doesn't take any meds  . History of staph infection 2014  . Sleep apnea     sleep study in epic from 2006 but doesn't use a cpap (10/18/2013)  . Type II diabetes mellitus (HCC)     takes Novolin and Novolog and takes Janumet daily  . TMHDQQIW(979.8)     "weekly" (10/18/2013)  . Osteoarthritis     "hands; elbows; knees" (10/18/2013)  . Hematemesis 10/18/2013    hospitalized  . Hepatitis yrs ago    hx of Hep A  . Pancytopenia ([place])     but never had a blood transfusion, had to take iron transfusion  sept 2014    Past Surgical History  Procedure Laterality Date  . Esophagogastroduodenoscopy (egd) with propofol N/A 02/07/2013    Procedure: ESOPHAGOGASTRODUODENOSCOPY  (EGD) WITH PROPOFOL;  Surgeon: Arta Silence, MD;  [facility];  Service: Endoscopy;  Laterality: N/A;  . Colonoscopy with propofol N/A 02/07/2013    Procedure: COLONOSCOPY WITH PROPOFOL;  Surgeon: Arta Silence, MD;  [facility];  Service: Endoscopy;  Laterality: N/A;  . Incision and drainage mouth  02/2013    " jaw area"  . Wisdom tooth extraction  ~  1997  . Anterior cervical decomp/discectomy fusion N/A 05/30/2013    Procedure: Cervical five-six anterior cervical decompression with interbody prosthesis plating and bonegraft;  Surgeon: Ophelia Charter, MD;  Location: [place] [facility];  Service: Neurosurgery;  Laterality: N/A;  Cervical five-six anterior cervical decompression with interbody prosthesis plating and bonegraft  . Esophagogastroduodenoscopy N/A 10/19/2013    Procedure: ESOPHAGOGASTRODUODENOSCOPY (EGD);  Surgeon: Jeryl Columbia, MD;  Location: [facility] ENDOSCOPY;  Service: Endoscopy;  Laterality: N/A;  . Esophagogastroduodenoscopy (egd) with propofol N/A 03/06/2014    Procedure: ESOPHAGOGASTRODUODENOSCOPY (EGD) WITH PROPOFOL;  Surgeon: Arta Silence, MD;  [facility];  Service: Endoscopy;  Laterality: N/A;  . Eus N/A 03/06/2014    Procedure: ESOPHAGEAL ENDOSCOPIC ULTRASOUND (EUS) RADIAL;  Surgeon: Arta Silence, MD;  [facility];  Service: Endoscopy;  Laterality: N/A;  . Colonoscopy  with propofol N/A 03/06/2014    Procedure: COLONOSCOPY WITH PROPOFOL;  Surgeon: Arta Silence, MD;  [facility];  Service: Endoscopy;  Laterality: N/A;    There were no vitals filed for this visit.  Visit Diagnosis:  Cervicalgia  Decreased ROM of neck      Subjective Assessment - 07/31/15 1536    Subjective Pt. reports 2/10 pain today at the upper neck.  No other pain or complaints reported.     Patient Stated Goals "To move neck without pain/stiffness"   Currently in Pain? Yes   Pain Score 2    Pain Location Neck   Pain Orientation Left   Pain  Descriptors / Indicators Tingling   Pain Type Chronic pain   Pain Radiating Towards n/a   Pain Onset More than a month ago   Pain Frequency Intermittent   Aggravating Factors  driving fork lift   Multiple Pain Sites No      Today's treatment   TherEx NuStep: level 5, 4 min (VC for UE use) Prone over table:  "I" + thoracic extension  x10  "T" + thoracic extension x10  "Y" + thoracic extension x10  "W" + thoracic extension x10  TRX Low Row emphasizing neutral spine posture x15 TRX chest stretch 3 way high low middle x 30 sec each  BATCA Low row 35# x15 BATCA Single UE Low row #20 x15 each arm Laying on  6" foam roll on mat tablel:  B Shoulder Horiz ABD+ Scapular retraction with black TB 2x15  Alternating Shoulder flexion / extension with black TB x15 each way  Supine chest stretch with 4# dumbbells in each hand 2 x 2 min        PT Education - 07/31/15 1838    Education provided No   Education Details HEP updated: scapular strengthening with band, corner stretch    Person(s) Educated Patient   Methods Explanation;Verbal cues   Comprehension Tactile cues required;Verbal cues required;Returned demonstration;Verbalized understanding         PT Short Term Goals - 07/10/15 1554    PT SHORT TERM GOAL #1   Title Pt will be independent with inital HEP by 07/08/15  07/10/15: Pt. ind. with initial HEP.   Status Achieved           PT Long Term Goals - 07/28/15 1755    PT LONG TERM GOAL #1   Title Pt will be independent with latest HEP by 08/05/15   Status On-going   PT LONG TERM GOAL #2   Title Pt will demonstrate improved cervical extension and bilateral rotation by at least 10 degrees by 08/05/15   Status Partially Met  Met for extension & L rotation, ongoing for R rotation   PT LONG TERM GOAL #3   Title Pt will report no further radicular symptoms in L UE by 08/05/15   Status Achieved   PT LONG TERM GOAL #4   Title Pt will report no further  "pressure headaches" by 08/05/15   Status On-going  Still reporting occasional headaches, but thinks they may be more from stress at this point   PT LONG TERM GOAL #5   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 tolerated all anterior chest stretching / scapular strengthening well today  with no neck pain increase from initial.  HEP updated in preparation for d/c in 3 treatments; HEP activities a mix of previously performed activities with past HEP activities.   Pt. verbalized understanding of all activitie.  Pt. 10th visit next visit.     PT Next Visit Plan Postural training; Cervical and thoracic ROM and flexibility; Manual therapy and modalities PRN        Problem List Patient Active Problem List   Diagnosis Date Noted  . Obesity 06/18/2015  . Metabolic syndrome 77/41/2878  . Hematemesis 10/18/2013  . GERD (gastroesophageal reflux disease)   . Esophageal varices ([place])   . Cervical herniated disc 05/30/2013  . ITP (idiopathic thrombocytopenic purpura) 05/16/2013  . insulin-dependent diabetes mellitus 04/27/2013  . Habitual alcohol use 02/07/2013  . Other pancytopenia ([place]) 06/15/2012  . Anemia 05/27/2012  . Hypertension 02/21/2011  . Hyperlipidemia 02/21/2011  . Fatty liver 02/21/2011  . Elevated liver enzymes 02/21/2011  . GE reflux 02/21/2011    Bess Harvest, PTA 07/31/2015, 6:40 PM  [facility] [address]  [place], [place], [address] Phone: [phone]   Fax:  [phone]  Name: Oscar [name] MRN: [id number] Date of Birth: 06-07-1965

## 2015-08-04 ENCOUNTER — Ambulatory Visit: Payer: BLUE CROSS/BLUE SHIELD

## 2015-08-04 DIAGNOSIS — R29898 Other symptoms and signs involving the musculoskeletal system: Secondary | ICD-10-CM

## 2015-08-04 DIAGNOSIS — M542 Cervicalgia: Secondary | ICD-10-CM | POA: Diagnosis not present

## 2015-08-04 DIAGNOSIS — M436 Torticollis: Secondary | ICD-10-CM | POA: Diagnosis not present

## 2015-08-04 NOTE — Therapy (Signed)
[facility] [address]  [place], [place], [address] Phone: [phone]   Fax:  [phone]  Physical Therapy Treatment  Patient Details  Name: Oscar [name] MRN: [id number] Date of Birth: [birth year] Referring Provider: Newman Pies, MD  Encounter Date: 08/04/2015      PT End of Session - 08/04/15 1541    Visit Number 10   Number of Visits 12   Date for PT Re-Evaluation 08/05/15   PT Start Time 9924   PT Stop Time 1617   PT Time Calculation (min) 39 min   Activity Tolerance Patient tolerated treatment well   Behavior During Therapy [facility] for tasks assessed/performed      Past Medical History  Diagnosis Date  . Fatty liver   . Obesity   . ITP (idiopathic thrombocytopenic purpura) 05/16/2013  . History of colon polyps   . Hypertension     takes Atenolol,HCTZ,and Lisinopril daily  . Gastric varices     takes Propranlol for this  . Numbness hands and fingers    and tingling  . Neck pain     HNP and radiculpathy  . GERD (gastroesophageal reflux disease)     takes Nexium daily  . GI bleed june 2015  . Esophageal varices ([place])   . Urinary frequency   . Constipation     related to pain meds and takes Colace daily  . Insomnia     but doesn't take any meds  . History of staph infection 2014  . Sleep apnea     sleep study in epic from 2006 but doesn't use a cpap (10/18/2013)  . Type II diabetes mellitus (HCC)     takes Novolin and Novolog and takes Janumet daily  . QASTMHDQ(222.9)     "weekly" (10/18/2013)  . Osteoarthritis     "hands; elbows; knees" (10/18/2013)  . Hematemesis 10/18/2013    hospitalized  . Hepatitis yrs ago    hx of Hep A  . Pancytopenia ([place])     but never had a blood transfusion, had to take iron transfusion  sept 2014    Past Surgical History  Procedure Laterality Date  . Esophagogastroduodenoscopy (egd) with propofol N/A 02/07/2013    Procedure:  ESOPHAGOGASTRODUODENOSCOPY (EGD) WITH PROPOFOL;  Surgeon: Arta Silence, MD;  Location: [facility] END[facility];  Service: Endoscopy;  Laterality: N/A;  . Colonoscopy with propofol N/A 02/07/2013    Procedure: COLONOSCOPY WITH PROPOFOL;  Surgeon: Arta Silence, MD;  Location: [facility];  Service: Endoscopy;  Laterality: N/A;  . Incision and drainage mouth  02/2013    " jaw area"  . Wisdom tooth extraction  ~  1997  . Anterior cervical decomp/discectomy fusion N/A 05/30/2013    Procedure: Cervical five-six anterior cervical decompression with interbody prosthesis plating and bonegraft;  Surgeon: Ophelia Charter, MD;  Location: [place];  Service: Neurosurgery;  Laterality: N/A;  Cervical five-six anterior cervical decompression with interbody prosthesis plating and bonegraft  . Esophagogastroduodenoscopy N/A 10/19/2013    Procedure: ESOPHAGOGASTRODUODENOSCOPY (EGD);  Surgeon: Jeryl Columbia, MD;  Location: [facility] ENDOSCOPY;  Service: Endoscopy;  Laterality: N/A;  . Esophagogastroduodenoscopy (egd) with propofol N/A 03/06/2014    Procedure: ESOPHAGOGASTRODUODENOSCOPY (EGD) WITH PROPOFOL;  Surgeon: Arta Silence, MD;  [facility];  Service: Endoscopy;  Laterality: N/A;  . Eus N/A 03/06/2014    Procedure: ESOPHAGEAL ENDOSCOPIC ULTRASOUND (EUS) RADIAL;  Surgeon: Arta Silence, MD;  Location: [facility];  Service: Endoscopy;  Laterality: N/A;  . Colonoscopy  with propofol N/A 03/06/2014    Procedure: COLONOSCOPY WITH PROPOFOL;  Surgeon: Arta Silence, MD;  Location: [facility];  Service: Endoscopy;  Laterality: N/A;    There were no vitals filed for this visit.          [facility] PT Assessment - 08/04/15 1540    Observation/Other Assessments   Focus on Therapeutic Outcomes (FOTO)  Neck - 75% (25% limited)   AROM   AROM Assessment Site Cervical   Cervical Flexion 47   Cervical Extension 46   Cervical - Right Side Bend 40   Cervical - Left Side Bend 43   Cervical - Right Rotation 64    Cervical - Left Rotation 73       Today's treatment   TherEx UBE: 2 min forwards / 2 min backwards, 4.0 intensity TRX chest stretch 3 way high low middle x 30 sec each  BATCA Low row 35# x15 BATCA Single UE Low row #35 x15 each arm Laying on 6" foam roll on mat tablel:    B Shoulder Horiz ABD+ Scapular retraction with black TB 2x15    Alternating Shoulder flexion / extension with black TB x15 each way    Supine chest stretch with 4# dumbbells in each hand 2 x 2 min  Cervical ROM assessment  Goals assessment         PT Short Term Goals - 07/10/15 1554    PT SHORT TERM GOAL #1   Title Pt will be independent with inital HEP by 07/08/15  07/10/15: Pt. ind. with initial HEP.   Status Achieved           PT Long Term Goals - 08/04/15 1545    PT LONG TERM GOAL #1   Title Pt will be independent with latest HEP by 08/05/15  08/04/15: Pt. independent with latest HEP.     Status Achieved   PT LONG TERM GOAL #2   Title Pt will demonstrate improved cervical extension and bilateral rotation by at least 10 degrees by 08/05/15  08/04/15: Pt. with improvement of cervical extension and bilateral rotation by at least 10 degrees.     Status Achieved  Met for extension & L rotation, ongoing for R rotation   PT LONG TERM GOAL #3   Title Pt will report no further radicular symptoms in L UE by 08/05/15  08/04/15: Pt. reports no radicular symptoms in L UE.    Status Achieved   PT LONG TERM GOAL #4   Title Pt will report no further "pressure headaches" by 08/05/15  08/04/15: pt. reports "pressure headaches" less frequent and less intense however still experiences every other day.     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    Status Achieved               Plan - 08/04/15 1543    Clinical Impression Statement Pt. reports no neck pain initially today.  Pt. able to meet all LTG's with exception of the incidence of "pressure headaches" reported with frequency of every other day.  Pt. anticipates d/c next visit pending PT order.     PT Next Visit Plan Assess goals.  Pt. anticipates d/c pending PT order.  Patient will benefit from skilled therapeutic intervention in order to improve the following deficits and impairments:  Pain, Decreased range of motion, Hypomobility, Impaired flexibility, Impaired sensation, Postural dysfunction  Visit Diagnosis: Cervicalgia  Decreased ROM of neck     Problem List Patient Active Problem List   Diagnosis Date Noted  . Obesity 06/18/2015  . Metabolic syndrome 66/59/9357  . Hematemesis 10/18/2013  . GERD (gastroesophageal reflux disease)   . Esophageal varices ([place])   . Cervical herniated disc 05/30/2013  . ITP (idiopathic thrombocytopenic purpura) 05/16/2013  . insulin-dependent diabetes mellitus 04/27/2013  . Habitual alcohol use 02/07/2013  . Other pancytopenia ([place]) 06/15/2012  . Anemia 05/27/2012  . Hypertension 02/21/2011  . Hyperlipidemia 02/21/2011  . Fatty liver 02/21/2011  . Elevated liver enzymes 02/21/2011  . GE reflux 02/21/2011    Bess Harvest, PTA 08/04/2015, 4:38 PM  [facility] [address]  [place], [place], [address] Phone: [phone]   Fax:  [phone]  Name: Oscar [name] MRN: [id number] Date of Birth: 03/26/1966

## 2015-08-07 ENCOUNTER — Ambulatory Visit: Payer: BLUE CROSS/BLUE SHIELD | Admitting: Physical Therapy

## 2015-08-07 DIAGNOSIS — R29898 Other symptoms and signs involving the musculoskeletal system: Secondary | ICD-10-CM | POA: Diagnosis not present

## 2015-08-07 DIAGNOSIS — M542 Cervicalgia: Secondary | ICD-10-CM

## 2015-08-07 DIAGNOSIS — M436 Torticollis: Secondary | ICD-10-CM | POA: Diagnosis not present

## 2015-08-07 NOTE — Therapy (Signed)
[facility] [address]  [place], [place], [address] Phone: [phone]   Fax:  [phone]  Physical Therapy Treatment  Patient Details  Name: Oscar [name] MRN: [id number] Date of Birth: Jun 03, 1965 Referring Provider: Newman Pies, MD  Encounter Date: 08/07/2015      PT End of Session - 08/07/15 1537    Visit Number 11   Number of Visits 12   Date for PT Re-Evaluation 08/05/15   PT Start Time 1532   PT Stop Time 1559   PT Time Calculation (min) 27 min   Activity Tolerance Patient tolerated treatment well   Behavior During Therapy [facility] for tasks assessed/performed      Past Medical History  Diagnosis Date  . Fatty liver   . Obesity   . ITP (idiopathic thrombocytopenic purpura) 05/16/2013  . History of colon polyps   . Hypertension     takes Atenolol,HCTZ,and Lisinopril daily  . Gastric varices     takes Propranlol for this  . Numbness hands and fingers    and tingling  . Neck pain     HNP and radiculpathy  . GERD (gastroesophageal reflux disease)     takes Nexium daily  . GI bleed june 2015  . Esophageal varices ([place])   . Urinary frequency   . Constipation     related to pain meds and takes Colace daily  . Insomnia     but doesn't take any meds  . History of staph infection 2014  . Sleep apnea     sleep study in epic from 2006 but doesn't use a cpap (10/18/2013)  . Type II diabetes mellitus (HCC)     takes Novolin and Novolog and takes Janumet daily  . ZESPQZRA(076.2)     "weekly" (10/18/2013)  . Osteoarthritis     "hands; elbows; knees" (10/18/2013)  . Hematemesis 10/18/2013    hospitalized  . Hepatitis yrs ago    hx of Hep A  . Pancytopenia ([place])     but never had a blood transfusion, had to take iron transfusion  sept 2014    Past Surgical History  Procedure Laterality Date  . Esophagogastroduodenoscopy (egd) with propofol N/A 02/07/2013    Procedure:  ESOPHAGOGASTRODUODENOSCOPY (EGD) WITH PROPOFOL;  Surgeon: Arta Silence, MD;  [facility]: [facility];  Service: Endoscopy;  Laterality: N/A;  . Colonoscopy with propofol N/A 02/07/2013    Procedure: COLONOSCOPY WITH PROPOFOL;  Surgeon: Arta Silence, MD;  Location: [facility];  Service: Endoscopy;  Laterality: N/A;  . Incision and drainage mouth  02/2013    " jaw area"  . Wisdom tooth extraction  ~  1997  . Anterior cervical decomp/discectomy fusion N/A 05/30/2013    Procedure: Cervical five-six anterior cervical decompression with interbody prosthesis plating and bonegraft;  Surgeon: Ophelia Charter, MD;  Location: [place] [facility];  Service: Neurosurgery;  Laterality: N/A;  Cervical five-six anterior cervical decompression with interbody prosthesis plating and bonegraft  . Esophagogastroduodenoscopy N/A 10/19/2013    Procedure: ESOPHAGOGASTRODUODENOSCOPY (EGD);  Surgeon: Jeryl Columbia, MD;  Location: [facility] ENDOSCOPY;  Service: Endoscopy;  Laterality: N/A;  . Esophagogastroduodenoscopy (egd) with propofol N/A 03/06/2014    Procedure: ESOPHAGOGASTRODUODENOSCOPY (EGD) WITH PROPOFOL;  Surgeon: Arta Silence, MD;  [facility]: [facility];  Service: Endoscopy;  Laterality: N/A;  . Eus N/A 03/06/2014    Procedure: ESOPHAGEAL ENDOSCOPIC ULTRASOUND (EUS) RADIAL;  Surgeon: Arta Silence, MD;  Location: [facility];  Service: Endoscopy;  Laterality: N/A;  . Colonoscopy  with propofol N/A 03/06/2014    Procedure: COLONOSCOPY WITH PROPOFOL;  Surgeon: Arta Silence, MD;  Location: [facility];  Service: Endoscopy;  Laterality: N/A;    There were no vitals filed for this visit.      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   Currently in Pain? No/denies            [facility] PT Assessment - 08/07/15 1532    Assessment   Medical Diagnosis Cervicalgia    Referring Provider Newman Pies, MD   Onset Date/Surgical Date 05/30/13  recent increased pain x past 6 months   Hand Dominance Right   Next MD Visit 6 mo f/u   Prior Function   Level of Independence Independent   Vocation Full time employment   Ambulance person, working on cars   Observation/Other Assessments   Focus on Therapeutic Outcomes (FOTO)  Neck - 75% (25% limited)   AROM   AROM Assessment Site Cervical   Cervical Flexion 54   Cervical Extension 56   Cervical - Right Side Bend 47   Cervical - Left Side Bend 48   Cervical - Right Rotation 74   Cervical - Left Rotation 68   Strength   Overall Strength Comments Bilateral shoulder, elbow, forearm and wrist strength grossly WNL and symmetrical   Right Hand Gross Grasp Functional   Right Hand Grip (lbs) 61   Left Hand Gross Grasp Functional   Left Hand Grip (lbs) 56           Today's treatment   TherEx UBE - lvl 4.0 Fwd/back x2 min each  Cervical ROM assessment  Goals assessment   Review of postural awareness and body mechanics with lifting  HEP review with recommendations for continued frequency          PT Short Term Goals - 07/10/15 1554    PT SHORT TERM GOAL #1   Title Pt will be independent with inital HEP by 07/08/15  07/10/15: Pt. ind. with initial HEP.   Status Achieved           PT Long Term Goals - 08/07/15 1545    PT LONG TERM GOAL #1   Title Pt will be independent with latest HEP by 08/05/15   Status Achieved   PT LONG TERM GOAL #2   Title Pt will demonstrate improved cervical extension and bilateral rotation by at least 10 degrees by 08/05/15   Status Achieved   PT LONG TERM GOAL #3   Title Pt will report no further radicular symptoms in L UE by 08/05/15   Status Achieved   PT LONG TERM GOAL #4   Title Pt will report no further "pressure headaches" by 08/05/15   Status Partially Met  Pt still getting occasional headaches (less  frequently) and thinks some of them may be allergy related.   PT LONG TERM GOAL #5   Title Pt will report ability to operate forklift without limitations due to neck pain or stiffness by 08/05/15   Status Achieved               Plan - 08/07/15 1609    Clinical Impression Statement Pt has demonstrated excellent progress with PT for this episode. Pt demonstrating good awareness of posture and functional cervical ROM in all planes;  and reporting no cervical/neck pain or radicular symptoms along with decreased frequency of "pressure headaches" (with pt thinking more recent headaches may be more allergy related. Pt able to complete job tasks including driving forklift without limitation due to pain or stiffness. All goals met for this episode with possible exception of headache goal only partially met, although pt acknowledging that headaches may also be allergy related at this point. HEP/home program reviewed along with review of postural awareness and proper body mechanics with lifting. Pt please with progress and in agreement with plan for discharge.   PT Next Visit Plan D/C   Consulted and Agree with Plan of Care Patient      Patient will benefit from skilled therapeutic intervention in order to improve the following deficits and impairments:  Pain, Decreased range of motion, Hypomobility, Impaired flexibility, Impaired sensation, Postural dysfunction  Visit Diagnosis: Cervicalgia     Problem List Patient Active Problem List   Diagnosis Date Noted  . Obesity 06/18/2015  . Metabolic syndrome 24/58/0998  . Hematemesis 10/18/2013  . GERD (gastroesophageal reflux disease)   . Esophageal varices ([place])   . Cervical herniated disc 05/30/2013  . ITP (idiopathic thrombocytopenic purpura) 05/16/2013  . insulin-dependent diabetes mellitus 04/27/2013  . Habitual alcohol use 02/07/2013  . Other pancytopenia ([place]) 06/15/2012  . Anemia 05/27/2012  . Hypertension 02/21/2011  .  Hyperlipidemia 02/21/2011  . Fatty liver 02/21/2011  . Elevated liver enzymes 02/21/2011  . GE reflux 02/21/2011    Percival Spanish, PT, MPT 08/07/2015, 4:24 PM  [facility] [address]  [place], [place], [address] Phone: [phone]   Fax:  [phone]  Name: Oscar [name] MRN: [id number] Date of Birth: [birth year]   PHYSICAL THERAPY DISCHARGE SUMMARY  Visits from Start of Care: 11  Current functional level related to goals / functional outcomes:  Pt has demonstrated excellent progress with PT for this episode. Pt demonstrating good awareness of posture and functional cervical ROM in all planes; and reporting no cervical/neck pain or radicular symptoms along with decreased frequency of "pressure headaches" (with pt thinking more recent headaches may be more allergy related). Pt able to complete job tasks including driving forklift without limitation due to pain or stiffness. All goals met for this episode with possible exception of headache goal only partially met, although pt acknowledging that headaches may also be allergy related at this point. HEP/home program reviewed along with review of postural awareness and proper body mechanics with lifting. Pt please with progress and in agreement with plan for discharge.   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  Patient goals were met. Patient is being discharged due to meeting the stated rehab goals.  ?????       Percival Spanish, PT, MPT 08/07/2015, 4:32 PM  [facility] [address]  [place], [place], [address] Phone: [phone]   Fax:  [phone]

## 2015-08-12 DIAGNOSIS — Z6841 Body Mass Index (BMI) 40.0 and over, adult: Secondary | ICD-10-CM | POA: Diagnosis not present

## 2015-08-12 DIAGNOSIS — M542 Cervicalgia: Secondary | ICD-10-CM | POA: Diagnosis not present

## 2015-08-12 DIAGNOSIS — M5412 Radiculopathy, cervical region: Secondary | ICD-10-CM | POA: Diagnosis not present

## 2015-08-15 ENCOUNTER — Other Ambulatory Visit: Payer: Self-pay | Admitting: Neurosurgery

## 2015-08-15 DIAGNOSIS — M5412 Radiculopathy, cervical region: Secondary | ICD-10-CM

## 2015-08-17 ENCOUNTER — Other Ambulatory Visit: Payer: Self-pay | Admitting: Internal Medicine

## 2015-08-21 ENCOUNTER — Other Ambulatory Visit: Payer: Self-pay | Admitting: Internal Medicine

## 2015-08-29 ENCOUNTER — Ambulatory Visit
Admission: RE | Admit: 2015-08-29 | Discharge: 2015-08-29 | Disposition: A | Discharge status: Home / Self Care | Payer: BLUE CROSS/BLUE SHIELD | Source: Ambulatory Visit | Attending: Neurosurgery | Admitting: Neurosurgery

## 2015-08-29 ENCOUNTER — Other Ambulatory Visit: Payer: Self-pay | Admitting: Neurosurgery

## 2015-08-29 DIAGNOSIS — M5412 Radiculopathy, cervical region: Secondary | ICD-10-CM

## 2015-08-29 DIAGNOSIS — Z77018 Contact with and (suspected) exposure to other hazardous metals: Secondary | ICD-10-CM

## 2015-08-29 DIAGNOSIS — M4322 Fusion of spine, cervical region: Secondary | ICD-10-CM | POA: Diagnosis not present

## 2015-08-29 DIAGNOSIS — Z01818 Encounter for other preprocedural examination: Secondary | ICD-10-CM | POA: Diagnosis not present

## 2015-09-11 ENCOUNTER — Ambulatory Visit (INDEPENDENT_AMBULATORY_CARE_PROVIDER_SITE_OTHER): Payer: BLUE CROSS/BLUE SHIELD | Admitting: Internal Medicine

## 2015-09-11 VITALS — BP 116/70 | HR 72 | Temp 98.0°F | Resp 18 | Ht 68.0 in | Wt 267.5 lb

## 2015-09-11 DIAGNOSIS — I8510 Secondary esophageal varices without bleeding: Secondary | ICD-10-CM

## 2015-09-11 DIAGNOSIS — E785 Hyperlipidemia, unspecified: Secondary | ICD-10-CM | POA: Diagnosis not present

## 2015-09-11 DIAGNOSIS — D72819 Decreased white blood cell count, unspecified: Secondary | ICD-10-CM

## 2015-09-11 DIAGNOSIS — I10 Essential (primary) hypertension: Secondary | ICD-10-CM | POA: Diagnosis not present

## 2015-09-11 DIAGNOSIS — I8500 Esophageal varices without bleeding: Secondary | ICD-10-CM

## 2015-09-11 DIAGNOSIS — Z125 Encounter for screening for malignant neoplasm of prostate: Secondary | ICD-10-CM

## 2015-09-11 DIAGNOSIS — S80811A Abrasion, right lower leg, initial encounter: Secondary | ICD-10-CM | POA: Diagnosis not present

## 2015-09-11 DIAGNOSIS — E669 Obesity, unspecified: Secondary | ICD-10-CM | POA: Diagnosis not present

## 2015-09-11 DIAGNOSIS — K7460 Unspecified cirrhosis of liver: Secondary | ICD-10-CM

## 2015-09-11 DIAGNOSIS — Z0000 Encounter for general adult medical examination without abnormal findings: Secondary | ICD-10-CM | POA: Diagnosis not present

## 2015-09-11 DIAGNOSIS — E1143 Type 2 diabetes mellitus with diabetic autonomic (poly)neuropathy: Secondary | ICD-10-CM | POA: Diagnosis not present

## 2015-09-11 DIAGNOSIS — Z13220 Encounter for screening for lipoid disorders: Secondary | ICD-10-CM

## 2015-09-11 DIAGNOSIS — E119 Type 2 diabetes mellitus without complications: Secondary | ICD-10-CM | POA: Diagnosis not present

## 2015-09-11 DIAGNOSIS — Z79899 Other long term (current) drug therapy: Secondary | ICD-10-CM

## 2015-09-11 DIAGNOSIS — K219 Gastro-esophageal reflux disease without esophagitis: Secondary | ICD-10-CM | POA: Diagnosis not present

## 2015-09-11 DIAGNOSIS — Z130 Encounter for screening for diseases of the blood and blood-forming organs and certain disorders involving the immune mechanism: Secondary | ICD-10-CM | POA: Diagnosis not present

## 2015-09-11 DIAGNOSIS — K3184 Gastroparesis: Secondary | ICD-10-CM

## 2015-09-11 DIAGNOSIS — D696 Thrombocytopenia, unspecified: Secondary | ICD-10-CM

## 2015-09-11 LAB — CBC WITH DIFFERENTIAL/PLATELET
BASOS ABS: 0 {cells}/uL (ref 0–200)
Basophils Relative: 0 %
Eosinophils Absolute: 87 cells/uL (ref 15–500)
Eosinophils Relative: 3 %
HEMATOCRIT: 40.1 % (ref 38.5–50.0)
Hemoglobin: 13.6 g/dL (ref 13.2–17.1)
LYMPHS ABS: 1044 {cells}/uL (ref 850–3900)
LYMPHS PCT: 36 %
MCH: 29.6 pg (ref 27.0–33.0)
MCHC: 33.9 g/dL (ref 32.0–36.0)
MCV: 87.2 fL (ref 80.0–100.0)
MONO ABS: 232 {cells}/uL (ref 200–950)
MPV: 9.5 fL (ref 7.5–12.5)
Monocytes Relative: 8 %
NEUTROS ABS: 1537 {cells}/uL (ref 1500–7800)
NEUTROS PCT: 53 %
Platelets: 77 10*3/uL — ABNORMAL LOW (ref 140–400)
RBC: 4.6 MIL/uL (ref 4.20–5.80)
RDW: 16.4 % — AB (ref 11.0–15.0)
WBC: 2.9 10*3/uL — ABNORMAL LOW (ref 3.8–10.8)

## 2015-09-11 LAB — COMPLETE METABOLIC PANEL WITH GFR
ALK PHOS: 100 U/L (ref 40–115)
ALT: 19 U/L (ref 9–46)
AST: 24 U/L (ref 10–40)
Albumin: 3.6 g/dL (ref 3.6–5.1)
BUN: 9 mg/dL (ref 7–25)
CALCIUM: 8.7 mg/dL (ref 8.6–10.3)
CHLORIDE: 103 mmol/L (ref 98–110)
CO2: 24 mmol/L (ref 20–31)
Creat: 0.63 mg/dL (ref 0.60–1.35)
Glucose, Bld: 135 mg/dL — ABNORMAL HIGH (ref 65–99)
POTASSIUM: 3.9 mmol/L (ref 3.5–5.3)
Sodium: 137 mmol/L (ref 135–146)
Total Bilirubin: 1.7 mg/dL — ABNORMAL HIGH (ref 0.2–1.2)
Total Protein: 6.6 g/dL (ref 6.1–8.1)

## 2015-09-11 LAB — LIPID PANEL
CHOL/HDL RATIO: 2.6 ratio (ref <=5.0)
CHOLESTEROL: 136 mg/dL (ref 125–200)
HDL: 52 mg/dL (ref >=40)
LDL Cholesterol: 72 mg/dL (ref <130)
TRIGLYCERIDES: 58 mg/dL (ref <150)
VLDL: 12 mg/dL (ref <30)

## 2015-09-11 LAB — HEMOGLOBIN A1C
Hgb A1c MFr Bld: 10.2 % — ABNORMAL HIGH (ref <5.7)
Mean Plasma Glucose: 246 mg/dL

## 2015-09-11 MED ORDER — METOCLOPRAMIDE HCL 5 MG PO TABS: 5.0000 mg | ORAL_TABLET | Freq: Three times a day (TID) | ORAL | Status: DC

## 2015-09-11 MED ORDER — CEPHALEXIN 500 MG PO CAPS: 500.0000 mg | ORAL_CAPSULE | Freq: Two times a day (BID) | ORAL | Status: DC

## 2015-09-11 NOTE — Patient Instructions (Signed)
RTC in 6 months. Try Reglan before meals. Refer to GI for follow up. Labs pending. Keflex for leg abrasion.

## 2015-09-11 NOTE — Progress Notes (Signed)
Subjective:    Patient ID: Oscar [name], male    DOB: 1966/02/08, 50 y.o.   MRN: [id number]  HPI Pleasant 50 year old Male in today for health maintenance exam and evaluation of medical issues.He has a history of hypertension and insulin-dependent diabetes mellitus. History of cirrhosis. Has esophageal varices. History of leukopenia and thrombocytopenia thought to be related to alcohol. History of obstructive sleep apnea, obesity, GE reflux. Multiple admissions for GI bleeding. Need surveillance endoscopy of varices on a regular basis.  He is followed by Dr. Marin Olp for thrombocytopenia and leukopenia.  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 He works in a [employer].   Social history: Married. Says he no longer drinks alcohol. Does not use drugs. Nonsmoker.  Had Prevnar in 2016 and pneumococcal 23 in 2010.  Last endoscopy by Dr. Watt Climes in 2015 showed distal esophageal varices which were stable  In 2015 he had a C5-C6 anterior dissectomy by Dr. Arnoldo Morale  History of sleep apnea treated with Cipro prescribed by Dr. Annamaria Boots in 2006.  Intolerant of by [place] causes nausea and vomiting  Dr. Bing Plume is eye physician.  No known drug allergies  History of colonoscopy by Dr. Paulita Fujita 2015 showing hyperplastic polyps and tubular adenomas. Says he wants to change GI physicians.  Diabetes treated with nodule and ATN and Janumet XOR. Low-pressure regimen consist of atenolol, HCTZ, lisinopril.  Family history: Father died from complications of surgery for kidney cancer. Mother with history of hypertension. 9 siblings. No children.      Review of Systems has been having issues recently with early satiety and nausea. Does not feel constipated.     Objective:   Physical Exam  Constitutional: He is oriented to person, place, and time. He appears well-developed and well-nourished. No distress.  HENT:  Head: Normocephalic  and atraumatic.  Right Ear: External ear normal.  Left Ear: External ear normal.  Mouth/Throat: Oropharynx is clear and moist. No oropharyngeal exudate.  Eyes: Conjunctivae and EOM are normal. Pupils are equal, round, and reactive to light. Right eye exhibits no discharge. Left eye exhibits no discharge. No scleral icterus.  Neck: Neck supple. No JVD present. No thyromegaly present.  Cardiovascular: Normal rate, regular rhythm, normal heart sounds and intact distal pulses.   No murmur heard. Pulmonary/Chest: Effort normal. No respiratory distress. He has no wheezes. He has no rales. He exhibits no tenderness.  Abdominal: Soft. Bowel sounds are normal. He exhibits no distension and no mass. There is no tenderness. There is no rebound and no guarding.  Genitourinary: Prostate normal.  Musculoskeletal: He exhibits no edema.  Lymphadenopathy:    He has no cervical adenopathy.  Neurological: He is alert and oriented to person, place, and time. He has normal reflexes. No cranial nerve deficit. Coordination normal.  Skin: Skin is warm and dry. He is not diaphoretic.  He has a leg abrasion. No evidence of drainage but is red about abraded area   Psychiatric: He has a normal mood and affect. His behavior is normal. Judgment and thought content normal.  Vitals reviewed.         Assessment & Plan:  Esophageal varices  Insulin-dependent diabetes  History of cirrhosis  Thrombocytopenia and leukopenia followed by Dr. Marin Olp  GE reflux  Early satiety-? Gastroparesis. Trial of Reglan  Essential hypertension  Hyperlipidemia  Obesity  Fatty liver  Multiple admissions for GI bleed  Abrasion of leg-treat with Lasix and cleaned with peroxide twice  daily  History of alcohol abuse  Plan: Continue same medications. Add Reglan for possible gastroparesis. Return in 6 months.

## 2015-09-12 LAB — MICROALBUMIN, URINE: Microalb, Ur: 1.5 mg/dL

## 2015-09-12 LAB — PSA: PSA: 0.35 ng/mL (ref <=4.00)

## 2015-09-24 ENCOUNTER — Ambulatory Visit (HOSPITAL_BASED_OUTPATIENT_CLINIC_OR_DEPARTMENT_OTHER): Payer: BLUE CROSS/BLUE SHIELD | Admitting: Family

## 2015-09-24 ENCOUNTER — Encounter: Payer: Self-pay | Admitting: Internal Medicine

## 2015-09-24 ENCOUNTER — Other Ambulatory Visit (HOSPITAL_BASED_OUTPATIENT_CLINIC_OR_DEPARTMENT_OTHER): Payer: BLUE CROSS/BLUE SHIELD

## 2015-09-24 ENCOUNTER — Encounter: Payer: Self-pay | Admitting: Hematology & Oncology

## 2015-09-24 DIAGNOSIS — D693 Immune thrombocytopenic purpura: Secondary | ICD-10-CM | POA: Diagnosis not present

## 2015-09-24 DIAGNOSIS — K920 Hematemesis: Secondary | ICD-10-CM

## 2015-09-24 DIAGNOSIS — D61818 Other pancytopenia: Secondary | ICD-10-CM

## 2015-09-24 DIAGNOSIS — K7581 Nonalcoholic steatohepatitis (NASH): Secondary | ICD-10-CM | POA: Diagnosis not present

## 2015-09-24 DIAGNOSIS — K7460 Unspecified cirrhosis of liver: Secondary | ICD-10-CM

## 2015-09-24 LAB — COMPREHENSIVE METABOLIC PANEL (CC13)
ALT: 20 IU/L (ref 0–44)
AST: 22 IU/L (ref 0–40)
Albumin, Serum: 3.5 g/dL (ref 3.5–5.5)
Albumin/Globulin Ratio: 1.1 — ABNORMAL LOW (ref 1.2–2.2)
Alkaline Phosphatase, S: 127 IU/L — ABNORMAL HIGH (ref 39–117)
BILIRUBIN TOTAL: 1.1 mg/dL (ref 0.0–1.2)
BUN/Creatinine Ratio: 14 (ref 9–20)
BUN: 12 mg/dL (ref 6–24)
CALCIUM: 9.3 mg/dL (ref 8.7–10.2)
CHLORIDE: 100 mmol/L (ref 96–106)
Carbon Dioxide, Total: 22 mmol/L (ref 18–29)
Creatinine, Ser: 0.85 mg/dL (ref 0.76–1.27)
GFR, EST AFRICAN AMERICAN: 118 mL/min/{1.73_m2} (ref >59)
GFR, EST NON AFRICAN AMERICAN: 102 mL/min/{1.73_m2} (ref >59)
GLOBULIN, TOTAL: 3.1 g/dL (ref 1.5–4.5)
Glucose: 340 mg/dL — ABNORMAL HIGH (ref 65–99)
POTASSIUM: 4.1 mmol/L (ref 3.5–5.2)
SODIUM: 132 mmol/L — AB (ref 134–144)
TOTAL PROTEIN: 6.6 g/dL (ref 6.0–8.5)

## 2015-09-24 LAB — CBC WITH DIFFERENTIAL (CANCER CENTER ONLY)
BASO#: 0 10*3/uL (ref 0.0–0.2)
BASO%: 0.3 % (ref 0.0–2.0)
EOS ABS: 0.1 10*3/uL (ref 0.0–0.5)
EOS%: 3.1 % (ref 0.0–7.0)
HCT: 37.5 % — ABNORMAL LOW (ref 38.7–49.9)
HGB: 13.3 g/dL (ref 13.0–17.1)
LYMPH#: 1.1 10*3/uL (ref 0.9–3.3)
LYMPH%: 36.3 % (ref 14.0–48.0)
MCH: 30.6 pg (ref 28.0–33.4)
MCHC: 35.5 g/dL (ref 32.0–35.9)
MCV: 86 fL (ref 82–98)
MONO#: 0.2 10*3/uL (ref 0.1–0.9)
MONO%: 8.2 % (ref 0.0–13.0)
NEUT#: 1.5 10*3/uL (ref 1.5–6.5)
NEUT%: 52.1 % (ref 40.0–80.0)
PLATELETS: 79 10*3/uL — AB (ref 145–400)
RBC: 4.35 10*6/uL (ref 4.20–5.70)
RDW: 14.4 % (ref 11.1–15.7)
WBC: 2.9 10*3/uL — AB (ref 4.0–10.0)

## 2015-09-24 LAB — CHCC SATELLITE - SMEAR

## 2015-09-24 NOTE — Progress Notes (Signed)
Hematology and Oncology Follow Up Visit  SERIGNE JAMES [id number] [birth year] 50 y.o. 09/24/2015   Principle Diagnosis:   Chronic immune thrombocytopenia  Cirrhosis  Leukopenia  Anemia secondary to intermittent GI bleeding  Current Therapy:    Nplate as needed for any procedure  IV iron as indicated    Interim History:  Mr. Alongi is here today for a follow-up. He is doing well and has had no episodes of bleeding or bruising. His platelet count is stable at 79. No anemia or leukopenia.  He is currently on Keflex for a cut on his right shin that appears to be healing nicely. It has scabbed over and is dry. No redness or erythema to indicate infection.  No fever, chills, n/v, cough, rash, dizziness, SOB, chest pain, palpitations, abdominal pain or changes in bowel or bladder habits.  His blood sugars are not well controlled on insulin (160-200's). He would like to switch to a different endocrinologist and plans to speak with his PCP about this.  His appetite comes and goes. He is staying hydrated. His weight is stable.  The neuropathy in his hands and feet is unchanged. No swelling or tenderness in his extremities. No c/o joint aches or "bone" pain.   Medications:    Medication List       This list is accurate as of: 09/24/15  3:26 PM.  Always use your most recent med list.               atenolol 50 MG tablet  Commonly known as:  TENORMIN  TAKE 1 TABLET DAILY     cephALEXin 500 MG capsule  Commonly known as:  KEFLEX  Take 1 capsule (500 mg total) by mouth 2 (two) times daily.     diphenhydrAMINE 25 mg capsule  Commonly known as:  BENADRYL  Take 25 mg by mouth every 6 (six) hours as needed for itching.     docusate sodium 100 MG capsule  Commonly known as:  COLACE  Take 100 mg by mouth daily as needed for mild constipation.     esomeprazole 40 MG capsule  Commonly known as:  NEXIUM  Take 1 capsule (40 mg total) by mouth 2 (two) times daily before a  meal.     gabapentin 300 MG capsule  Commonly known as:  NEURONTIN  2 capsules by mouth daily at bedtime for peripheral neuropathy     hydrochlorothiazide 25 MG tablet  Commonly known as:  HYDRODIURIL  TAKE 1 TABLET EVERY MORNING     insulin NPH-regular Human (70-30) 100 UNIT/ML injection  Commonly known as:  NOVOLIN 70/30  Inject 10 Units into the skin at bedtime.     JANUMET 50-1000 MG tablet  Generic drug:  sitaGLIPtin-metformin  TAKE 1 TABLET TWICE A DAY WITH MEALS     lisinopril 20 MG tablet  Commonly known as:  PRINIVIL,ZESTRIL  TAKE 1 TABLET DAILY     metoCLOPramide 5 MG tablet  Commonly known as:  REGLAN  Take 1 tablet (5 mg total) by mouth 3 (three) times daily before meals.     NOVOLOG FLEXPEN 100 UNIT/ML FlexPen  Generic drug:  insulin aspart  Inject 5-10 Units into the skin 3 (three) times daily.     propranolol 10 MG tablet  Commonly known as:  INDERAL  Take 10 mg by mouth 2 (two) times daily.     sodium chloride 0.65 % Soln nasal spray  Commonly known as:  OCEAN  Place 2 sprays into both  nostrils daily as needed for congestion.        Allergies:  Allergies  Allergen Reactions  . Byetta 10 Mcg Pen [Exenatide] Nausea And Vomiting    Past Medical History, Surgical history, Social history, and Family History were reviewed and updated.  Review of Systems: All other 10 point review of systems is negative.   Physical Exam:  vitals were not taken for this visit.  Wt Readings from Last 3 Encounters:  09/11/15 267 lb 8 oz (121.337 kg)  03/24/15 277 lb (125.646 kg)  03/17/15 267 lb (121.11 kg)    Ocular: Sclerae unicteric, pupils equal, round and reactive to light Ear-nose-throat: Oropharynx clear, dentition fair Lymphatic: No cervical supraclavicular or axillary adenopathy Lungs no rales or rhonchi, good excursion bilaterally Heart regular rate and rhythm, no murmur appreciated Abd soft, nontender, positive bowel sounds, no liver or spleen tip  palpated on exam, no fluid wave  MSK no focal spinal tenderness, no joint edema Neuro: non-focal, well-oriented, appropriate affect Breasts: Deferred  Lab Results  Component Value Date   WBC 2.9* 09/24/2015   HGB 13.3 09/24/2015   HCT 37.5* 09/24/2015   MCV 86 09/24/2015   PLT 79* 09/24/2015   Lab Results  Component Value Date   FERRITIN 14* 09/20/2014   IRON 84 09/20/2014   TIBC 521* 09/20/2014   UIBC 437* 09/20/2014   IRONPCTSAT 16* 09/20/2014   Lab Results  Component Value Date   RETICCTPCT 1.5 09/20/2014   RBC 4.35 09/24/2015   RETICCTABS 69.5 09/20/2014   No results found for: KPAFRELGTCHN, LAMBDASER, KAPLAMBRATIO No results found for: IGGSERUM, IGA, IGMSERUM No results found for: Odetta Pink, SPEI   Chemistry      Component Value Date/Time   NA 137 09/11/2015 1227   NA 138 03/24/2015 1548   K 3.9 09/11/2015 1227   K 4.5 03/24/2015 1548   CL 103 09/11/2015 1227   CL 104 03/24/2015 1548   CO2 24 09/11/2015 1227   CO2 27 03/24/2015 1548   BUN 9 09/11/2015 1227   BUN 12 03/24/2015 1548   CREATININE 0.63 09/11/2015 1227   CREATININE 0.70 09/20/2014 1021      Component Value Date/Time   CALCIUM 8.7 09/11/2015 1227   CALCIUM 9.4 03/24/2015 1548   ALKPHOS 100 09/11/2015 1227   ALKPHOS 104* 03/24/2015 1548   AST 24 09/11/2015 1227   AST 30 03/24/2015 1548   ALT 19 09/11/2015 1227   ALT 28 03/24/2015 1548   BILITOT 1.7* 09/11/2015 1227   BILITOT 1.50 03/24/2015 1548     Impression and Plan: Mr. Stueck is a very pleasant 50 yo male with chronic immune thrombocytopenia and NASH related cirrhosis. He continues to do well and has had no episodes of bleeding. His platelet count is 79 at this time and no anemia.  He has no procedure planned at this time. He is due for a colonoscopy but this has not been scheduled yet. He will notify us once this is schedule so we can plan to check his lab work and administer  Nplate if needed.  We will see what his iron studies show and bring him in next week for an infusion if needed.  We will plan to see him back in 6 months for labs and follow-up.  Both he and his wife know to contact us with any questions or concerns. We can certainly see her sooner if need be.   Eliezer Bottom, NP 5/31/20173:26 PM

## 2015-09-25 LAB — IRON AND TIBC
%SAT: 24 % (ref 20–55)
Iron: 112 ug/dL (ref 42–163)
TIBC: 463 ug/dL — ABNORMAL HIGH (ref 202–409)
UIBC: 351 ug/dL (ref 117–376)

## 2015-09-25 LAB — FERRITIN: Ferritin: 15 ng/ml — ABNORMAL LOW (ref 22–316)

## 2015-09-25 LAB — RETICULOCYTES: Reticulocyte Count: 1.6 % (ref 0.6–2.6)

## 2015-10-09 DIAGNOSIS — M542 Cervicalgia: Secondary | ICD-10-CM | POA: Diagnosis not present

## 2015-10-09 DIAGNOSIS — E1165 Type 2 diabetes mellitus with hyperglycemia: Secondary | ICD-10-CM | POA: Diagnosis not present

## 2015-10-09 DIAGNOSIS — Z794 Long term (current) use of insulin: Secondary | ICD-10-CM | POA: Diagnosis not present

## 2015-10-09 DIAGNOSIS — M5412 Radiculopathy, cervical region: Secondary | ICD-10-CM | POA: Diagnosis not present

## 2015-11-01 ENCOUNTER — Other Ambulatory Visit: Payer: Self-pay | Admitting: Internal Medicine

## 2015-11-05 ENCOUNTER — Ambulatory Visit (HOSPITAL_BASED_OUTPATIENT_CLINIC_OR_DEPARTMENT_OTHER): Payer: BLUE CROSS/BLUE SHIELD | Admitting: Family

## 2015-11-05 ENCOUNTER — Encounter: Payer: Self-pay | Admitting: Family

## 2015-11-05 ENCOUNTER — Ambulatory Visit: Payer: BLUE CROSS/BLUE SHIELD

## 2015-11-05 ENCOUNTER — Other Ambulatory Visit (HOSPITAL_BASED_OUTPATIENT_CLINIC_OR_DEPARTMENT_OTHER): Payer: BLUE CROSS/BLUE SHIELD

## 2015-11-05 VITALS — BP 115/69 | HR 69 | Temp 98.0°F | Resp 18 | Ht 68.0 in | Wt 276.0 lb

## 2015-11-05 DIAGNOSIS — D693 Immune thrombocytopenic purpura: Secondary | ICD-10-CM

## 2015-11-05 DIAGNOSIS — G629 Polyneuropathy, unspecified: Secondary | ICD-10-CM | POA: Diagnosis not present

## 2015-11-05 DIAGNOSIS — K7581 Nonalcoholic steatohepatitis (NASH): Secondary | ICD-10-CM | POA: Diagnosis not present

## 2015-11-05 DIAGNOSIS — K7460 Unspecified cirrhosis of liver: Secondary | ICD-10-CM

## 2015-11-05 LAB — CBC WITH DIFFERENTIAL (CANCER CENTER ONLY)
BASO#: 0 10*3/uL (ref 0.0–0.2)
BASO%: 0.4 % (ref 0.0–2.0)
EOS ABS: 0.1 10*3/uL (ref 0.0–0.5)
EOS%: 2.9 % (ref 0.0–7.0)
HCT: 37.2 % — ABNORMAL LOW (ref 38.7–49.9)
HGB: 13.5 g/dL (ref 13.0–17.1)
LYMPH#: 1.1 10*3/uL (ref 0.9–3.3)
LYMPH%: 39.9 % (ref 14.0–48.0)
MCH: 31.4 pg (ref 28.0–33.4)
MCHC: 36.3 g/dL — AB (ref 32.0–35.9)
MCV: 87 fL (ref 82–98)
MONO#: 0.2 10*3/uL (ref 0.1–0.9)
MONO%: 7.6 % (ref 0.0–13.0)
NEUT#: 1.4 10*3/uL — ABNORMAL LOW (ref 1.5–6.5)
NEUT%: 49.2 % (ref 40.0–80.0)
PLATELETS: 81 10*3/uL — AB (ref 145–400)
RBC: 4.3 10*6/uL (ref 4.20–5.70)
RDW: 14.5 % (ref 11.1–15.7)
WBC: 2.8 10*3/uL — AB (ref 4.0–10.0)

## 2015-11-05 LAB — COMPREHENSIVE METABOLIC PANEL (CC13)
A/G RATIO: 1.1 — AB (ref 1.2–2.2)
ALK PHOS: 91 IU/L (ref 39–117)
ALT: 21 IU/L (ref 0–44)
AST: 23 IU/L (ref 0–40)
Albumin, Serum: 3.6 g/dL (ref 3.5–5.5)
BILIRUBIN TOTAL: 1.2 mg/dL (ref 0.0–1.2)
BUN/Creatinine Ratio: 15 (ref 9–20)
BUN: 13 mg/dL (ref 6–24)
CHLORIDE: 102 mmol/L (ref 96–106)
Calcium, Ser: 8.7 mg/dL (ref 8.7–10.2)
Carbon Dioxide, Total: 23 mmol/L (ref 18–29)
Creatinine, Ser: 0.84 mg/dL (ref 0.76–1.27)
GFR calc non Af Amer: 103 mL/min/{1.73_m2} (ref >59)
GFR, EST AFRICAN AMERICAN: 119 mL/min/{1.73_m2} (ref >59)
Globulin, Total: 3.2 g/dL (ref 1.5–4.5)
Glucose: 167 mg/dL — ABNORMAL HIGH (ref 65–99)
POTASSIUM: 3.7 mmol/L (ref 3.5–5.2)
Sodium: 134 mmol/L (ref 134–144)
Total Protein: 6.8 g/dL (ref 6.0–8.5)

## 2015-11-05 LAB — CHCC SATELLITE - SMEAR

## 2015-11-05 NOTE — Progress Notes (Signed)
Hematology and Oncology Follow Up Visit  Oscar [name] [id number] 04/20/1966 50 y.o. 11/05/2015   Principle Diagnosis:   Chronic immune thrombocytopenia  Cirrhosis  Leukopenia  Anemia secondary to intermittent GI bleeding  Current Therapy:    Nplate as needed for any procedure  IV iron as indicated    Interim History:  Oscar [name] is here today for a follow-up. He will be having an epidural followed by an injection in the neck on 26th of this month for neck and shoulder pain. His platelet count is 81 at this time. He will need Nplate prior to his procedure to help prevent the possibility of bleeding. We are working on Government social research officer for this and will then schedule him later this week.  No fever, chills, n/v, cough, rash, dizziness, SOB, chest pain, palpitations, abdominal pain or changes in bowel or bladder habits.  The neuropathy in his hands and feet is better since starting Neurontin. No swelling or tenderness in his extremities. No c/o joint aches or "bone" pain.  His appetite is ok. He is staying hydrated. His weight is up 9 lbs since his last visit. His blood sugars are still not well controlled.   Medications:    Medication List       This list is accurate as of: 11/05/15  3:42 PM.  Always use your most recent med list.               atenolol 50 MG tablet  Commonly known as:  TENORMIN  TAKE 1 TABLET DAILY     cephALEXin 500 MG capsule  Commonly known as:  KEFLEX  Take 1 capsule (500 mg total) by mouth 2 (two) times daily.     diphenhydrAMINE 25 mg capsule  Commonly known as:  BENADRYL  Take 25 mg by mouth every 6 (six) hours as needed for itching.     docusate sodium 100 MG capsule  Commonly known as:  COLACE  Take 100 mg by mouth daily as needed for mild constipation.     esomeprazole 40 MG capsule  Commonly known as:  NEXIUM  Take 1 capsule (40 mg total) by mouth 2 (two) times daily before a meal.     gabapentin 300 MG capsule    Commonly known as:  NEURONTIN  2 capsules by mouth daily at bedtime for peripheral neuropathy     hydrochlorothiazide 25 MG tablet  Commonly known as:  HYDRODIURIL  TAKE 1 TABLET EVERY MORNING     insulin NPH-regular Human (70-30) 100 UNIT/ML injection  Commonly known as:  NOVOLIN 70/30  Inject 10 Units into the skin at bedtime.     JANUMET 50-1000 MG tablet  Generic drug:  sitaGLIPtin-metformin  TAKE 1 TABLET TWICE A DAY WITH MEALS     lisinopril 20 MG tablet  Commonly known as:  PRINIVIL,ZESTRIL  TAKE 1 TABLET DAILY     metoCLOPramide 5 MG tablet  Commonly known as:  REGLAN  Take 1 tablet (5 mg total) by mouth 3 (three) times daily before meals.     NOVOLOG FLEXPEN 100 UNIT/ML FlexPen  Generic drug:  insulin aspart  Inject 5-10 Units into the skin 3 (three) times daily.     propranolol 10 MG tablet  Commonly known as:  INDERAL  Take 10 mg by mouth 2 (two) times daily.     sodium chloride 0.65 % Soln nasal spray  Commonly known as:  OCEAN  Place 2 sprays into both nostrils daily as needed for congestion.  Allergies:  Allergies  Allergen Reactions  . Byetta 10 Mcg Pen [Exenatide] Nausea And Vomiting    Past Medical History, Surgical history, Social history, and Family History were reviewed and updated.  Review of Systems: All other 10 point review of systems is negative.   Physical Exam:  vitals were not taken for this visit.  Wt Readings from Last 3 Encounters:  09/11/15 267 lb 8 oz (121.337 kg)  03/24/15 277 lb (125.646 kg)  03/17/15 267 lb (121.11 kg)    Ocular: Sclerae unicteric, pupils equal, round and reactive to light Ear-nose-throat: Oropharynx clear, dentition fair Lymphatic: No cervical supraclavicular or axillary adenopathy Lungs no rales or rhonchi, good excursion bilaterally Heart regular rate and rhythm, no murmur appreciated Abd soft, nontender, positive bowel sounds, no liver or spleen tip palpated on exam, no fluid wave  MSK no  focal spinal tenderness, no joint edema Neuro: non-focal, well-oriented, appropriate affect Breasts: Deferred  Lab Results  Component Value Date   WBC 2.8* 11/05/2015   HGB 13.5 11/05/2015   HCT 37.2* 11/05/2015   MCV 87 11/05/2015   PLT 81* 11/05/2015   Lab Results  Component Value Date   FERRITIN 15* 09/24/2015   IRON 112 09/24/2015   TIBC 463* 09/24/2015   UIBC 351 09/24/2015   IRONPCTSAT 24 09/24/2015   Lab Results  Component Value Date   RETICCTPCT 1.5 09/20/2014   RBC 4.30 11/05/2015   RETICCTABS 69.5 09/20/2014   No results found for: KPAFRELGTCHN, LAMBDASER, KAPLAMBRATIO No results found for: IGGSERUM, IGA, IGMSERUM No results found for: Odetta Pink, SPEI   Chemistry      Component Value Date/Time   NA 132* 09/24/2015 1458   NA 137 09/11/2015 1227   NA 138 03/24/2015 1548   K 4.1 09/24/2015 1458   K 3.9 09/11/2015 1227   K 4.5 03/24/2015 1548   CL 100 09/24/2015 1458   CL 103 09/11/2015 1227   CL 104 03/24/2015 1548   CO2 22 09/24/2015 1458   CO2 24 09/11/2015 1227   CO2 27 03/24/2015 1548   BUN 12 09/24/2015 1458   BUN 9 09/11/2015 1227   BUN 12 03/24/2015 1548   CREATININE 0.85 09/24/2015 1458   CREATININE 0.63 09/11/2015 1227   CREATININE 0.70 09/20/2014 1021      Component Value Date/Time   CALCIUM 9.3 09/24/2015 1458   CALCIUM 8.7 09/11/2015 1227   CALCIUM 9.4 03/24/2015 1548   ALKPHOS 127* 09/24/2015 1458   ALKPHOS 100 09/11/2015 1227   ALKPHOS 104* 03/24/2015 1548   AST 22 09/24/2015 1458   AST 24 09/11/2015 1227   AST 30 03/24/2015 1548   ALT 20 09/24/2015 1458   ALT 19 09/11/2015 1227   ALT 28 03/24/2015 1548   BILITOT 1.1 09/24/2015 1458   BILITOT 1.7* 09/11/2015 1227   BILITOT 1.50 03/24/2015 1548     Impression and Plan: Oscar [name] is a very pleasant 50 yo male with chronic immune thrombocytopenia and NASH related cirrhosis. He will be receiving an epidural and injection  in his neck later this month and needs Nplate before hand to prevent the possibility of bleeding. His platelet count at this time is 81. No episodes of bleeding or bruising. We will set him up for his injection once his prior authorization is complete.  We will see what his iron studies show and bring him in next week for an infusion if needed.  We will plan to see him back  in 6 months for labs and follow-up.  Both he and his wife know to contact us with any questions or concerns. We can certainly see her sooner if need be.   Eliezer Bottom, NP 7/12/20173:42 PM

## 2015-11-06 LAB — RETICULOCYTES: Reticulocyte Count: 1.5 % (ref 0.6–2.6)

## 2015-11-06 LAB — IRON AND TIBC
%SAT: 20 % (ref 20–55)
IRON: 98 ug/dL (ref 42–163)
TIBC: 494 ug/dL — AB (ref 202–409)
UIBC: 396 ug/dL — AB (ref 117–376)

## 2015-11-06 LAB — FERRITIN: Ferritin: 16 ng/ml — ABNORMAL LOW (ref 22–316)

## 2015-11-14 ENCOUNTER — Ambulatory Visit: Payer: BLUE CROSS/BLUE SHIELD

## 2015-11-19 DIAGNOSIS — M5412 Radiculopathy, cervical region: Secondary | ICD-10-CM | POA: Diagnosis not present

## 2015-11-19 DIAGNOSIS — Z6839 Body mass index (BMI) 39.0-39.9, adult: Secondary | ICD-10-CM | POA: Diagnosis not present

## 2015-11-26 ENCOUNTER — Other Ambulatory Visit: Payer: Self-pay | Admitting: Internal Medicine

## 2015-11-26 ENCOUNTER — Telehealth: Payer: Self-pay

## 2015-11-26 ENCOUNTER — Encounter: Payer: Self-pay | Admitting: Internal Medicine

## 2015-11-26 NOTE — Telephone Encounter (Signed)
Spouse called in scheduled appt in response to mychart reply from Dr. Renold Genta. Scheduled per patient's work schedule. Spouse verbalized understanding.

## 2015-12-01 ENCOUNTER — Ambulatory Visit (INDEPENDENT_AMBULATORY_CARE_PROVIDER_SITE_OTHER): Payer: BLUE CROSS/BLUE SHIELD | Admitting: Internal Medicine

## 2015-12-01 ENCOUNTER — Encounter: Payer: Self-pay | Admitting: Internal Medicine

## 2015-12-01 VITALS — BP 136/74 | HR 96 | Temp 97.8°F | Ht 68.0 in | Wt 263.0 lb

## 2015-12-01 DIAGNOSIS — B958 Unspecified staphylococcus as the cause of diseases classified elsewhere: Secondary | ICD-10-CM

## 2015-12-01 DIAGNOSIS — L0293 Carbuncle, unspecified: Secondary | ICD-10-CM | POA: Diagnosis not present

## 2015-12-01 MED ORDER — DOXYCYCLINE MONOHYDRATE 100 MG PO CAPS: 100.0000 mg | ORAL_CAPSULE | Freq: Two times a day (BID) | ORAL | 1 refills | Status: DC

## 2015-12-01 MED ORDER — MUPIROCIN 2 % EX OINT: 1.0000 "application " | TOPICAL_OINTMENT | Freq: Two times a day (BID) | CUTANEOUS | 0 refills | Status: DC

## 2015-12-01 NOTE — Patient Instructions (Signed)
Purchase Hibiclens soap and use daily. I'll allow soap stay on scan for 5 minutes from the neck down. Use washcloth to bathe. Rinse off well. Uses soap for 2-4 weeks. Bactroban ointment and nostrils at least once daily. Doxycycline 100 mg twice daily for 10 days with one refill. If not better in 10 days have prescription refilled.

## 2015-12-01 NOTE — Progress Notes (Signed)
   Subjective:    Patient ID: Oscar [name], male    DOB: 01/22/1966, 50 y.o.   MRN: [id number]  HPI Patient contacted [place] last week regarding lesions on abdomen. Says they tend to come up during the summer. Are somewhat painful. Have not drained. No fever or chills.    Review of Systems     Objective:   Physical Exam He has 2 carbuncles on his lower abdomen one on the left side and one on the right side. The one on the left side is much bigger. It's inflamed red but not draining.       Assessment & Plan:  Carbuncles on abdomen  Probable MRSA  Plan: Bactroban ointment to use in nostrils once daily for 2-4 weeks. Doxycycline 100 mg twice daily for 10 days with one refill. If not better in 10 days have prescription refilled. He is to bathe in Hibiclens soap, allowing soap to sit on scan for 5 minutes before rinsing off daily from the neck down. He is to use a washcloth daily.

## 2015-12-26 ENCOUNTER — Encounter: Payer: Self-pay | Admitting: Internal Medicine

## 2016-01-21 ENCOUNTER — Other Ambulatory Visit: Payer: Self-pay | Admitting: Internal Medicine

## 2016-01-27 DIAGNOSIS — I10 Essential (primary) hypertension: Secondary | ICD-10-CM | POA: Diagnosis not present

## 2016-01-27 DIAGNOSIS — E7800 Pure hypercholesterolemia, unspecified: Secondary | ICD-10-CM | POA: Diagnosis not present

## 2016-01-27 DIAGNOSIS — K769 Liver disease, unspecified: Secondary | ICD-10-CM | POA: Diagnosis not present

## 2016-01-27 DIAGNOSIS — E1165 Type 2 diabetes mellitus with hyperglycemia: Secondary | ICD-10-CM | POA: Diagnosis not present

## 2016-01-28 ENCOUNTER — Other Ambulatory Visit: Payer: Self-pay | Admitting: Internal Medicine

## 2016-01-28 NOTE — Telephone Encounter (Signed)
Refill x 90 days

## 2016-01-29 NOTE — Telephone Encounter (Signed)
This has been completed.

## 2016-02-03 DIAGNOSIS — E1165 Type 2 diabetes mellitus with hyperglycemia: Secondary | ICD-10-CM | POA: Diagnosis not present

## 2016-03-08 ENCOUNTER — Ambulatory Visit: Payer: BLUE CROSS/BLUE SHIELD | Admitting: Internal Medicine

## 2016-03-25 ENCOUNTER — Other Ambulatory Visit: Payer: BLUE CROSS/BLUE SHIELD

## 2016-03-25 ENCOUNTER — Ambulatory Visit: Payer: BLUE CROSS/BLUE SHIELD | Admitting: Family

## 2016-04-08 ENCOUNTER — Ambulatory Visit (HOSPITAL_BASED_OUTPATIENT_CLINIC_OR_DEPARTMENT_OTHER): Payer: BLUE CROSS/BLUE SHIELD | Admitting: Family

## 2016-04-08 ENCOUNTER — Other Ambulatory Visit (HOSPITAL_BASED_OUTPATIENT_CLINIC_OR_DEPARTMENT_OTHER): Payer: BLUE CROSS/BLUE SHIELD

## 2016-04-08 VITALS — BP 139/73 | HR 70 | Temp 97.9°F | Resp 20 | Wt 276.4 lb

## 2016-04-08 DIAGNOSIS — D509 Iron deficiency anemia, unspecified: Secondary | ICD-10-CM | POA: Diagnosis not present

## 2016-04-08 DIAGNOSIS — K7581 Nonalcoholic steatohepatitis (NASH): Secondary | ICD-10-CM | POA: Diagnosis not present

## 2016-04-08 DIAGNOSIS — D693 Immune thrombocytopenic purpura: Secondary | ICD-10-CM

## 2016-04-08 DIAGNOSIS — G629 Polyneuropathy, unspecified: Secondary | ICD-10-CM | POA: Diagnosis not present

## 2016-04-08 DIAGNOSIS — D508 Other iron deficiency anemias: Secondary | ICD-10-CM

## 2016-04-08 DIAGNOSIS — D72819 Decreased white blood cell count, unspecified: Secondary | ICD-10-CM

## 2016-04-08 LAB — CBC WITH DIFFERENTIAL (CANCER CENTER ONLY)
BASO#: 0 10*3/uL (ref 0.0–0.2)
BASO%: 0.3 % (ref 0.0–2.0)
EOS%: 2.5 % (ref 0.0–7.0)
Eosinophils Absolute: 0.1 10*3/uL (ref 0.0–0.5)
HEMATOCRIT: 38.1 % — AB (ref 38.7–49.9)
HGB: 13.9 g/dL (ref 13.0–17.1)
LYMPH#: 1.1 10*3/uL (ref 0.9–3.3)
LYMPH%: 35.3 % (ref 14.0–48.0)
MCH: 31.4 pg (ref 28.0–33.4)
MCHC: 36.5 g/dL — ABNORMAL HIGH (ref 32.0–35.9)
MCV: 86 fL (ref 82–98)
MONO#: 0.3 10*3/uL (ref 0.1–0.9)
MONO%: 8.1 % (ref 0.0–13.0)
NEUT#: 1.7 10*3/uL (ref 1.5–6.5)
NEUT%: 53.8 % (ref 40.0–80.0)
PLATELETS: 82 10*3/uL — AB (ref 145–400)
RBC: 4.43 10*6/uL (ref 4.20–5.70)
RDW: 14.3 % (ref 11.1–15.7)
WBC: 3.2 10*3/uL — ABNORMAL LOW (ref 4.0–10.0)

## 2016-04-08 LAB — COMPREHENSIVE METABOLIC PANEL (CC13)
ALK PHOS: 93 IU/L (ref 39–117)
ALT: 23 IU/L (ref 0–44)
AST: 25 IU/L (ref 0–40)
Albumin, Serum: 3.6 g/dL (ref 3.5–5.5)
Albumin/Globulin Ratio: 1 — ABNORMAL LOW (ref 1.2–2.2)
BUN/Creatinine Ratio: 17 (ref 9–20)
BUN: 12 mg/dL (ref 6–24)
Bilirubin Total: 1.1 mg/dL (ref 0.0–1.2)
CO2: 24 mmol/L (ref 18–29)
CREATININE: 0.69 mg/dL — AB (ref 0.76–1.27)
Calcium, Ser: 9.3 mg/dL (ref 8.7–10.2)
Chloride, Ser: 103 mmol/L (ref 96–106)
GFR calc Af Amer: 129 mL/min/{1.73_m2} (ref >59)
GFR calc non Af Amer: 111 mL/min/{1.73_m2} (ref >59)
GLUCOSE: 217 mg/dL — AB (ref 65–99)
Globulin, Total: 3.6 g/dL (ref 1.5–4.5)
Potassium, Ser: 3.8 mmol/L (ref 3.5–5.2)
Sodium: 133 mmol/L — ABNORMAL LOW (ref 134–144)
Total Protein: 7.2 g/dL (ref 6.0–8.5)

## 2016-04-08 LAB — CHCC SATELLITE - SMEAR

## 2016-04-08 NOTE — Progress Notes (Signed)
Hematology and Oncology Follow Up Visit  Oscar [name] [id number] April 25, 1966 50 y.o. 04/08/2016   Principle Diagnosis:   Chronic immune thrombocytopenia  Cirrhosis  Leukopenia  Anemia secondary to intermittent GI bleeding  Current Therapy:    Nplate as needed for any procedure  IV iron as indicated    Interim History:  Oscar [name] is here today for a follow-up. He is doing well and has had no issues with bleeding, bruising or petechiae. He was able to receive his epidural which has helped quite a bit with his neck pain. His platelet count is stable at 82.  No fever, chills, n/v, cough, rash, dizziness, SOB, chest pain, palpitations, abdominal pain or changes in bowel or bladder habits.  He has mild neuropathy in his feet. His blood sugars are still not well controlled on insulin.  No swelling or tenderness in his extremities. No c/o joint aches or "bone" pain.  He has maintained a good appetite and is staying well hydrated. His weight is stable.   Medications:    Medication List       Accurate as of 04/08/16  3:18 PM. Always use your most recent med list.          atenolol 50 MG tablet Commonly known as:  TENORMIN TAKE 1 TABLET DAILY   doxycycline 100 MG capsule Commonly known as:  MONODOX Take 1 capsule (100 mg total) by mouth 2 (two) times daily.   esomeprazole 40 MG capsule Commonly known as:  NEXIUM Take 1 capsule (40 mg total) by mouth 2 (two) times daily before a meal.   gabapentin 300 MG capsule Commonly known as:  NEURONTIN TAKE 2 CAPSULES DAILY AT BEDTIME FOR PERIPHERAL NEUROPATHY   hydrochlorothiazide 25 MG tablet Commonly known as:  HYDRODIURIL TAKE 1 TABLET EVERY MORNING   insulin NPH-regular Human (70-30) 100 UNIT/ML injection Commonly known as:  NOVOLIN 70/30 Inject 10 Units into the skin at bedtime.   JANUMET 50-1000 MG tablet Generic drug:  sitaGLIPtin-metformin TAKE 1 TABLET TWICE A DAY WITH MEALS   lisinopril 20 MG  tablet Commonly known as:  PRINIVIL,ZESTRIL TAKE 1 TABLET DAILY   metoCLOPramide 5 MG tablet Commonly known as:  REGLAN Take 1 tablet (5 mg total) by mouth 3 (three) times daily before meals.   mupirocin ointment 2 % Commonly known as:  BACTROBAN Place 1 application into the nose 2 (two) times daily.   NOVOLOG FLEXPEN 100 UNIT/ML FlexPen Generic drug:  insulin aspart Inject 5-10 Units into the skin 3 (three) times daily.   propranolol 10 MG tablet Commonly known as:  INDERAL Take 10 mg by mouth 2 (two) times daily.   sodium chloride 0.65 % Soln nasal spray Commonly known as:  OCEAN Place 2 sprays into both nostrils daily as needed for congestion.       Allergies:  Allergies  Allergen Reactions  . Byetta 10 Mcg Pen [Exenatide] Nausea And Vomiting    Past Medical History, Surgical history, Social history, and Family History were reviewed and updated.  Review of Systems: All other 10 point review of systems is negative.   Physical Exam:  vitals were not taken for this visit.  Wt Readings from Last 3 Encounters:  12/01/15 263 lb (119.3 kg)  11/05/15 276 lb (125.2 kg)  09/11/15 267 lb 8 oz (121.3 kg)    Ocular: Sclerae unicteric, pupils equal, round and reactive to light Ear-nose-throat: Oropharynx clear, dentition fair Lymphatic: No cervical supraclavicular or axillary adenopathy Lungs no rales or rhonchi,  good excursion bilaterally Heart regular rate and rhythm, no murmur appreciated Abd soft, nontender, positive bowel sounds, no liver or spleen tip palpated on exam, no fluid wave  MSK no focal spinal tenderness, no joint edema Neuro: non-focal, well-oriented, appropriate affect Breasts: Deferred  Lab Results  Component Value Date   WBC 3.2 (L) 04/08/2016   HGB 13.9 04/08/2016   HCT 38.1 (L) 04/08/2016   MCV 86 04/08/2016   PLT 82 (L) 04/08/2016   Lab Results  Component Value Date   FERRITIN 16 (L) 11/05/2015   IRON 98 11/05/2015   TIBC 494 (H)  11/05/2015   UIBC 396 (H) 11/05/2015   IRONPCTSAT 20 11/05/2015   Lab Results  Component Value Date   RETICCTPCT 1.5 09/20/2014   RBC 4.43 04/08/2016   RETICCTABS 69.5 09/20/2014   No results found for: KPAFRELGTCHN, LAMBDASER, KAPLAMBRATIO No results found for: IGGSERUM, IGA, IGMSERUM No results found for: Odetta Pink, SPEI   Chemistry      Component Value Date/Time   NA 134 11/05/2015 1527   NA 138 03/24/2015 1548   K 3.7 11/05/2015 1527   K 4.5 03/24/2015 1548   CL 102 11/05/2015 1527   CL 104 03/24/2015 1548   CO2 23 11/05/2015 1527   CO2 27 03/24/2015 1548   BUN 13 11/05/2015 1527   BUN 12 03/24/2015 1548   CREATININE 0.84 11/05/2015 1527   CREATININE 0.63 09/11/2015 1227      Component Value Date/Time   CALCIUM 8.7 11/05/2015 1527   CALCIUM 9.4 03/24/2015 1548   ALKPHOS 91 11/05/2015 1527   ALKPHOS 104 (H) 03/24/2015 1548   AST 23 11/05/2015 1527   AST 30 03/24/2015 1548   ALT 21 11/05/2015 1527   ALT 28 03/24/2015 1548   BILITOT 1.2 11/05/2015 1527   BILITOT 1.50 03/24/2015 1548     Impression and Plan: Oscar [name] is a very pleasant 50 yo male with chronic immune thrombocytopenia, NASH related cirrhosis and iron deficiency anemia.  His platelet count is stable at 82 and Hgb 13.9. His epidural was a success and he has had some pain relief.  We will see what his iron studies show and bring him in next week for an infusion if needed.  We will plan to see him back in 6 months for repeat labs and follow-up.  Both he and his wife know to contact our with any questions/concerns or if he needs to have any type of surgical procedure. We can certainly see her sooner if need be.   Eliezer Bottom, NP 12/14/20173:18 PM

## 2016-04-09 LAB — IRON AND TIBC
%SAT: 21 % (ref 20–55)
Iron: 108 ug/dL (ref 42–163)
TIBC: 514 ug/dL — AB (ref 202–409)
UIBC: 405 ug/dL — AB (ref 117–376)

## 2016-04-09 LAB — RETICULOCYTES: Reticulocyte Count: 2.7 % — ABNORMAL HIGH (ref 0.6–2.6)

## 2016-04-09 LAB — FERRITIN: FERRITIN: 14 ng/mL — AB (ref 22–316)

## 2016-04-13 ENCOUNTER — Other Ambulatory Visit: Payer: Self-pay | Admitting: Internal Medicine

## 2016-04-27 DIAGNOSIS — Z6839 Body mass index (BMI) 39.0-39.9, adult: Secondary | ICD-10-CM | POA: Diagnosis not present

## 2016-04-27 DIAGNOSIS — M542 Cervicalgia: Secondary | ICD-10-CM | POA: Diagnosis not present

## 2016-04-27 DIAGNOSIS — I10 Essential (primary) hypertension: Secondary | ICD-10-CM | POA: Diagnosis not present

## 2016-05-10 ENCOUNTER — Other Ambulatory Visit (INDEPENDENT_AMBULATORY_CARE_PROVIDER_SITE_OTHER): Payer: BLUE CROSS/BLUE SHIELD

## 2016-05-10 ENCOUNTER — Encounter: Payer: Self-pay | Admitting: Internal Medicine

## 2016-05-10 ENCOUNTER — Ambulatory Visit (INDEPENDENT_AMBULATORY_CARE_PROVIDER_SITE_OTHER): Payer: BLUE CROSS/BLUE SHIELD | Admitting: Internal Medicine

## 2016-05-10 VITALS — BP 120/74 | HR 64 | Ht 67.5 in | Wt 273.5 lb

## 2016-05-10 DIAGNOSIS — K7469 Other cirrhosis of liver: Secondary | ICD-10-CM | POA: Diagnosis not present

## 2016-05-10 DIAGNOSIS — K920 Hematemesis: Secondary | ICD-10-CM

## 2016-05-10 DIAGNOSIS — R1013 Epigastric pain: Secondary | ICD-10-CM | POA: Diagnosis not present

## 2016-05-10 DIAGNOSIS — Z86010 Personal history of colonic polyps: Secondary | ICD-10-CM | POA: Diagnosis not present

## 2016-05-10 LAB — PROTIME-INR
INR: 1.2 ratio — ABNORMAL HIGH (ref 0.8–1.0)
PROTHROMBIN TIME: 13.1 s (ref 9.6–13.1)

## 2016-05-10 NOTE — Progress Notes (Signed)
Oscar [name] 51 y.o. [birth year] [id number]  Assessment & Plan:   Encounter Diagnoses  Name Primary?  . Abdominal pain, epigastric   . Hematemesis with nausea Yes  . Other cirrhosis of liver ([place])   . Hx of adenomatous colonic polyps     Complicated patient with multiple problems.  1. Epigastric abdominal pain - Complete abdominal ultrasound given his abdominal pain and cirrhosis.  Evaluate for ascites which may be contributing to his pain and nausea.  Order PT/INR to assess liver function. 2. Hematemesis - Upper endoscopy to re-evaluate esophageal varices with possible banding he has apparently not tolerated propranolol 3. Consider repeat colonoscopy 3 years after last to f/u polyps (02/2017) 4. Consider Janumet (Januvia component) as a cause of pain 5. ? If has ITP or rather leukopenia and thrombocytopenia related to portal htn 6. ? Why still iron deficient The risks and benefits as well as alternatives of endoscopic procedure(s) have been discussed and reviewed. All questions answered. The patient agrees to proceed.  Further plans pending above   Carlena Hurl, PA-S  Agree w/ note - I have seen patient w/ Ms. Taler Kushner and she has served as a Education administrator. Gatha Mayer, MD, [facility]  [id number] J, MD  Subjective:   Chief Complaint: Epigastric pain and nausea after meals  HPI 51 year old male with PMH of NASH-cirrhosis (was an alcohol user also ? contribution) and esophageal varices presents with 1.5 year history of epigastric pain which radiates to RUQ and nausea after eating.  The abdominal pain lasts 10-15 mins; he denies any correlation with eating specific foods or being in certain positions.  He was taking Reglan for nausea but is not taking anything currently because it "stopped working."  He states he vomits about once every 2 months, with the last episode occurring in Sept-Oct when a trace amount of blood was seen.  He admits to occasional diarrhea, but no melena.   Denies NSAID or excessive caffeine use.  Takes Nexium daily for GERD.  Denies all other GI ROS.    Prior HIDA scan and imaging has failed to show a cause for abdominal pain.   Endoscopic evaluations - images viewed  Previous EGD's 2014 Gr2-3 esophageal varices and portal gastropathy 09/2013 - small flattend varices (had admit for hematemesis) 02/2014 - small varices and portal htn  Colonoscopies 01/2013 - 8 and 15 mm adenomas 02/2014 6 mm  10 mm polyps 1 adenoma 1 hyperplastic  Current Meds  Medication Sig  . atenolol (TENORMIN) 50 MG tablet TAKE 1 TABLET DAILY  . esomeprazole (NEXIUM) 40 MG capsule Take 1 capsule (40 mg total) by mouth 2 (two) times daily before a meal.  . etodolac (LODINE) 400 MG tablet Take 1 tablet by mouth daily.  Marland Kitchen gabapentin (NEURONTIN) 300 MG capsule TAKE 2 CAPSULES DAILY AT BEDTIME FOR PERIPHERAL NEUROPATHY  . HUMULIN N 100 UNIT/ML injection Inject 30 Units into the skin at bedtime.  . hydrochlorothiazide (HYDRODIURIL) 25 MG tablet TAKE 1 TABLET EVERY MORNING  . JANUMET 50-1000 MG tablet TAKE 1 TABLET TWICE A DAY WITH MEALS  . lisinopril (PRINIVIL,ZESTRIL) 20 MG tablet TAKE 1 TABLET DAILY  . methocarbamol (ROBAXIN) 500 MG tablet Take 500 mg by mouth as needed for muscle spasms.  . metoCLOPramide (REGLAN) 5 MG tablet Take 1 tablet (5 mg total) by mouth 3 (three) times daily before meals.  . mupirocin ointment (BACTROBAN) 2 % Place 1 application into the nose 2 (two) times daily.  Marland Kitchen NOVOLOG FLEXPEN  100 UNIT/ML SOPN FlexPen Inject 10 Units into the skin 3 (three) times daily with meals.   . sodium chloride (OCEAN) 0.65 % SOLN nasal spray Place 2 sprays into both nostrils daily as needed for congestion.    Allergies  Allergen Reactions  . Byetta 10 Mcg Pen [Exenatide] Nausea And Vomiting    Past Medical History:  Diagnosis Date  . Anemia   . Constipation    related to pain meds and takes Colace daily  . Esophageal varices ([place])   . Fatty liver   .  Gastric varices    takes Propranlol for this  . GERD (gastroesophageal reflux disease)    takes Nexium daily  . GI bleed june 2015  . [id number])    "weekly" (10/18/2013)  . Hematemesis 10/18/2013   hospitalized  . Hepatitis yrs ago   hx of Hep A  . History of colon polyps   . History of staph infection 2014  . Hypertension    takes Atenolol,HCTZ,and Lisinopril daily  . Insomnia    but doesn't take any meds  . ITP (idiopathic thrombocytopenic purpura) 05/16/2013  . Neck pain    HNP and radiculpathy  . Numbness hands and fingers   and tingling  . Obesity   . Osteoarthritis    "hands; elbows; knees" (10/18/2013)  . Pancytopenia ([place])    but never had a blood transfusion, had to take iron transfusion  sept 2014  . Sleep apnea    sleep study in epic from 2006 but doesn't use a cpap (10/18/2013)  . Type II diabetes mellitus (HCC)    takes Novolin and Novolog and takes Janumet daily  . Urinary frequency    Past Surgical History:  Procedure Laterality Date  . ANTERIOR CERVICAL DECOMP/DISCECTOMY FUSION N/A 05/30/2013   Procedure: Cervical five-six anterior cervical decompression with interbody prosthesis plating and bonegraft;  Surgeon: Ophelia Charter, MD;  Location: [facility];  Service: Neurosurgery;  Laterality: N/A;  Cervical five-six anterior cervical decompression with interbody prosthesis plating and bonegraft  . COLONOSCOPY WITH PROPOFOL N/A 02/07/2013   Procedure: COLONOSCOPY WITH PROPOFOL;  Surgeon: Arta Silence, MD;  Location: [facility];  Service: Endoscopy;  Laterality: N/A;  . COLONOSCOPY WITH PROPOFOL N/A 03/06/2014   Procedure: COLONOSCOPY WITH PROPOFOL;  Surgeon: Arta Silence, MD;  Location: [facility];  Service: Endoscopy;  Laterality: N/A;  . ESOPHAGOGASTRODUODENOSCOPY N/A 10/19/2013   Procedure: ESOPHAGOGASTRODUODENOSCOPY (EGD);  Surgeon: Jeryl Columbia, MD;  Location: [facility] END[facility]COPY;  Service: Endoscopy;  Laterality: N/A;  . ESOPHAGOGASTRODUODENOSCOPY  (EGD) WITH PROPOFOL N/A 02/07/2013   Procedure: ESOPHAGOGASTRODUODENOSCOPY (EGD) WITH PROPOFOL;  Surgeon: Arta Silence, MD;  [facility];  Service: Endoscopy;  Laterality: N/A;  . ESOPHAGOGASTRODUODENOSCOPY (EGD) WITH PROPOFOL N/A 03/06/2014   Procedure: ESOPHAGOGASTRODUODENOSCOPY (EGD) WITH PROPOFOL;  Surgeon: Arta Silence, MD;  [facility];  Service: Endoscopy;  Laterality: N/A;  . EUS N/A 03/06/2014   Procedure: ESOPHAGEAL ENDOSCOPIC ULTRASOUND (EUS) RADIAL;  Surgeon: Arta Silence, MD;  Location: [facility];  Service: Endoscopy;  Laterality: N/A;  . INCISION AND DRAINAGE MOUTH  02/2013   " jaw area"  . WISDOM TOOTH EXTRACTION  ~  1997   Social History   Social History  . Marital status: Married    Spouse name: N/A  . Number of children: 0  . Years of education: N/A   Occupational History  . truck Advice worker    Social History Main Topics  . Smoking status: Never Smoker  . Smokeless tobacco: Never Used  Comment: never used tobacco  . Alcohol use No     Comment: quit drinking compeletely in Oct 2014  . Drug use: No  . Sexual activity: Not Currently   Other Topics Concern  . Not on file   Social History Narrative  . No narrative on file     Review of Systems All other ROS negative    Objective:   Physical Exam BP 120/74 (BP Location: Left Arm, Patient Position: Sitting, Cuff Size: Normal)   Pulse 64   Ht 5' 7.5" (1.715 m) Comment: height measured without shoes  Wt 273 lb 8 oz (124.1 kg)   BMI 42.20 kg/m  GA: Obese, pleasant male appears in no acute distress HEENT: Oral mucosa moist without ulcerations Heart: RRR. No m/r/g, trace LE edema Lungs: CTAB Abdomen: ObeseSoft, non-tender, non-distended, no rebound or guarding.  Normoactive BS.  Negative Murphy's sign Neuro: Alert and oriented x 3, no asterixis Psych: Mood and affect normal   Data reviewed:  See HPI Also reviewed recent 2017 labs [facility] records, [place],  [facility] 2016 pathology reports

## 2016-05-10 NOTE — Patient Instructions (Signed)
  Your physician has requested that you go to the basement for the following lab work before leaving today: PT/INR   You have been scheduled for an endoscopy. Please follow written instructions given to you at your visit today. If you use inhalers (even only as needed), please bring them with you on the day of your procedure. Your physician has requested that you go to www.startemmi.com and enter the access code given to you at your visit today. This web site gives a general overview about your procedure. However, you should still follow specific instructions given to you by our office regarding your preparation for the procedure.   You have been scheduled for an abdominal ultrasound at [facility] on 05/18/16 at 8:00AM. Please arrive 20 minutes prior to your appointment for registration. Make certain not to have anything to eat or drink after midnight.. Should you need to reschedule your appointment, please contact radiology at [phone]. This test typically takes about 30 minutes to perform.   Imaging is located at [address] in the [facility] building.   I appreciate the opportunity to care for you. Silvano Rusk, MD, [facility]

## 2016-05-12 NOTE — Progress Notes (Signed)
INR ok My Chart note

## 2016-05-18 ENCOUNTER — Ambulatory Visit
Admission: RE | Admit: 2016-05-18 | Discharge: 2016-05-18 | Disposition: A | Discharge status: Home / Self Care | Payer: BLUE CROSS/BLUE SHIELD | Source: Ambulatory Visit | Attending: Internal Medicine | Admitting: Internal Medicine

## 2016-05-18 DIAGNOSIS — K7460 Unspecified cirrhosis of liver: Secondary | ICD-10-CM | POA: Diagnosis not present

## 2016-05-18 DIAGNOSIS — K7469 Other cirrhosis of liver: Secondary | ICD-10-CM

## 2016-05-18 DIAGNOSIS — R1013 Epigastric pain: Secondary | ICD-10-CM

## 2016-05-18 NOTE — Progress Notes (Signed)
Please let him know that the major finding here is that the gallbladder is contracted - it has been that way before on ultrasounds Usually not a significant issue but could be bellet evaluated with an MRI of liver and MRCP which I recommend re: abnormal gallbladder on [place], epigastric pain, cirrhosis

## 2016-05-21 ENCOUNTER — Telehealth: Payer: Self-pay | Admitting: Internal Medicine

## 2016-05-21 NOTE — Telephone Encounter (Signed)
See results for additional details.  

## 2016-05-24 ENCOUNTER — Other Ambulatory Visit: Payer: Self-pay

## 2016-05-24 DIAGNOSIS — R1013 Epigastric pain: Secondary | ICD-10-CM

## 2016-05-24 DIAGNOSIS — K7469 Other cirrhosis of liver: Secondary | ICD-10-CM

## 2016-05-24 DIAGNOSIS — R933 Abnormal findings on diagnostic imaging of other parts of digestive tract: Secondary | ICD-10-CM

## 2016-05-24 NOTE — Addendum Note (Signed)
Addended by: Marlon Pel on: 05/24/2016 11:22 AM   Modules accepted: Orders

## 2016-06-01 ENCOUNTER — Other Ambulatory Visit: Payer: Self-pay | Admitting: Internal Medicine

## 2016-06-01 ENCOUNTER — Ambulatory Visit (HOSPITAL_COMMUNITY)
Admission: RE | Admit: 2016-06-01 | Discharge: 2016-06-01 | Disposition: A | Discharge status: Home / Self Care | Payer: BLUE CROSS/BLUE SHIELD | Source: Ambulatory Visit | Attending: Internal Medicine | Admitting: Internal Medicine

## 2016-06-01 DIAGNOSIS — R1013 Epigastric pain: Secondary | ICD-10-CM

## 2016-06-01 DIAGNOSIS — R933 Abnormal findings on diagnostic imaging of other parts of digestive tract: Secondary | ICD-10-CM

## 2016-06-01 DIAGNOSIS — K7460 Unspecified cirrhosis of liver: Secondary | ICD-10-CM | POA: Diagnosis not present

## 2016-06-01 DIAGNOSIS — R935 Abnormal findings on diagnostic imaging of other abdominal regions, including retroperitoneum: Secondary | ICD-10-CM | POA: Diagnosis not present

## 2016-06-01 DIAGNOSIS — K769 Liver disease, unspecified: Secondary | ICD-10-CM | POA: Insufficient documentation

## 2016-06-01 DIAGNOSIS — I517 Cardiomegaly: Secondary | ICD-10-CM | POA: Diagnosis not present

## 2016-06-01 DIAGNOSIS — K7469 Other cirrhosis of liver: Secondary | ICD-10-CM

## 2016-06-01 DIAGNOSIS — K766 Portal hypertension: Secondary | ICD-10-CM | POA: Insufficient documentation

## 2016-06-01 DIAGNOSIS — K760 Fatty (change of) liver, not elsewhere classified: Secondary | ICD-10-CM | POA: Diagnosis not present

## 2016-06-01 DIAGNOSIS — I8500 Esophageal varices without bleeding: Secondary | ICD-10-CM | POA: Diagnosis not present

## 2016-06-01 DIAGNOSIS — R161 Splenomegaly, not elsewhere classified: Secondary | ICD-10-CM | POA: Insufficient documentation

## 2016-06-01 LAB — POCT I-STAT CREATININE: Creatinine, Ser: 0.7 mg/dL (ref 0.61–1.24)

## 2016-06-01 MED ORDER — GADOBENATE DIMEGLUMINE 529 MG/ML IV SOLN
20.0000 mL | Freq: Once | INTRAVENOUS | Status: AC | PRN
  Administered 2016-06-01: 20 mL via INTRAVENOUS

## 2016-06-03 NOTE — Progress Notes (Signed)
6 month MR recall   I sent a My chart message

## 2016-06-21 ENCOUNTER — Telehealth: Payer: Self-pay

## 2016-06-21 NOTE — Telephone Encounter (Signed)
   Goran called back and he will be able to come earlier to [facility] ENDO 06/25/16.  I called Sharee Pimple in ENDO and she will change the schedule accordingly.

## 2016-06-21 NOTE — Telephone Encounter (Signed)
Left message to call me back to see if we can move her husbands EGD appointment up to a 6:30AM arrival for a 8:00AM case at [facility].

## 2016-06-22 DIAGNOSIS — K769 Liver disease, unspecified: Secondary | ICD-10-CM | POA: Diagnosis not present

## 2016-06-22 DIAGNOSIS — E1165 Type 2 diabetes mellitus with hyperglycemia: Secondary | ICD-10-CM | POA: Diagnosis not present

## 2016-06-22 DIAGNOSIS — E7800 Pure hypercholesterolemia, unspecified: Secondary | ICD-10-CM | POA: Diagnosis not present

## 2016-06-22 DIAGNOSIS — I10 Essential (primary) hypertension: Secondary | ICD-10-CM | POA: Diagnosis not present

## 2016-06-25 ENCOUNTER — Ambulatory Visit (HOSPITAL_COMMUNITY)
Admission: RE | Admit: 2016-06-25 | Discharge: 2016-06-25 | Disposition: A | Discharge status: Home / Self Care | Payer: BLUE CROSS/BLUE SHIELD | Source: Ambulatory Visit | Attending: Internal Medicine | Admitting: Internal Medicine

## 2016-06-25 ENCOUNTER — Ambulatory Visit (HOSPITAL_COMMUNITY): Payer: BLUE CROSS/BLUE SHIELD | Admitting: Certified Registered Nurse Anesthetist

## 2016-06-25 ENCOUNTER — Encounter (HOSPITAL_COMMUNITY)
Admission: RE | Disposition: A | Discharge status: Home / Self Care | Payer: Self-pay | Source: Ambulatory Visit | Attending: Internal Medicine

## 2016-06-25 ENCOUNTER — Encounter (HOSPITAL_COMMUNITY): Payer: Self-pay

## 2016-06-25 DIAGNOSIS — Z8619 Personal history of other infectious and parasitic diseases: Secondary | ICD-10-CM | POA: Diagnosis not present

## 2016-06-25 DIAGNOSIS — K7460 Unspecified cirrhosis of liver: Secondary | ICD-10-CM | POA: Insufficient documentation

## 2016-06-25 DIAGNOSIS — M1990 Unspecified osteoarthritis, unspecified site: Secondary | ICD-10-CM | POA: Diagnosis not present

## 2016-06-25 DIAGNOSIS — K3189 Other diseases of stomach and duodenum: Secondary | ICD-10-CM

## 2016-06-25 DIAGNOSIS — D649 Anemia, unspecified: Secondary | ICD-10-CM | POA: Diagnosis not present

## 2016-06-25 DIAGNOSIS — R1013 Epigastric pain: Secondary | ICD-10-CM | POA: Diagnosis present

## 2016-06-25 DIAGNOSIS — K920 Hematemesis: Secondary | ICD-10-CM | POA: Insufficient documentation

## 2016-06-25 DIAGNOSIS — G4700 Insomnia, unspecified: Secondary | ICD-10-CM | POA: Diagnosis not present

## 2016-06-25 DIAGNOSIS — Z6841 Body Mass Index (BMI) 40.0 and over, adult: Secondary | ICD-10-CM | POA: Diagnosis not present

## 2016-06-25 DIAGNOSIS — K219 Gastro-esophageal reflux disease without esophagitis: Secondary | ICD-10-CM | POA: Diagnosis not present

## 2016-06-25 DIAGNOSIS — Z794 Long term (current) use of insulin: Secondary | ICD-10-CM | POA: Diagnosis not present

## 2016-06-25 DIAGNOSIS — I10 Essential (primary) hypertension: Secondary | ICD-10-CM | POA: Diagnosis not present

## 2016-06-25 DIAGNOSIS — E649 Sequelae of unspecified nutritional deficiency: Secondary | ICD-10-CM | POA: Diagnosis not present

## 2016-06-25 DIAGNOSIS — E119 Type 2 diabetes mellitus without complications: Secondary | ICD-10-CM | POA: Insufficient documentation

## 2016-06-25 DIAGNOSIS — K766 Portal hypertension: Secondary | ICD-10-CM | POA: Diagnosis not present

## 2016-06-25 DIAGNOSIS — Z79899 Other long term (current) drug therapy: Secondary | ICD-10-CM | POA: Insufficient documentation

## 2016-06-25 DIAGNOSIS — K2950 Unspecified chronic gastritis without bleeding: Secondary | ICD-10-CM | POA: Diagnosis not present

## 2016-06-25 DIAGNOSIS — Z86010 Personal history of colonic polyps: Secondary | ICD-10-CM | POA: Insufficient documentation

## 2016-06-25 DIAGNOSIS — I8510 Secondary esophageal varices without bleeding: Secondary | ICD-10-CM | POA: Diagnosis not present

## 2016-06-25 DIAGNOSIS — E669 Obesity, unspecified: Secondary | ICD-10-CM | POA: Insufficient documentation

## 2016-06-25 DIAGNOSIS — D696 Thrombocytopenia, unspecified: Secondary | ICD-10-CM

## 2016-06-25 DIAGNOSIS — D61818 Other pancytopenia: Secondary | ICD-10-CM | POA: Insufficient documentation

## 2016-06-25 DIAGNOSIS — K7469 Other cirrhosis of liver: Secondary | ICD-10-CM | POA: Diagnosis not present

## 2016-06-25 DIAGNOSIS — G4730 Sleep apnea, unspecified: Secondary | ICD-10-CM | POA: Insufficient documentation

## 2016-06-25 DIAGNOSIS — I8500 Esophageal varices without bleeding: Secondary | ICD-10-CM | POA: Diagnosis not present

## 2016-06-25 HISTORY — DX: Thrombocytopenia, unspecified: D69.6

## 2016-06-25 HISTORY — DX: Nonalcoholic steatohepatitis (NASH): K75.81

## 2016-06-25 HISTORY — PX: ESOPHAGOGASTRODUODENOSCOPY (EGD) WITH PROPOFOL: SHX5813

## 2016-06-25 HISTORY — DX: Unspecified cirrhosis of liver: K74.60

## 2016-06-25 LAB — GLUCOSE, CAPILLARY: GLUCOSE-CAPILLARY: 225 mg/dL — AB (ref 65–99)

## 2016-06-25 SURGERY — ESOPHAGOGASTRODUODENOSCOPY (EGD) WITH PROPOFOL
Anesthesia: Monitor Anesthesia Care

## 2016-06-25 MED ORDER — PROPOFOL 10 MG/ML IV BOLUS
INTRAVENOUS | Status: AC
Start: 2016-06-25 — End: 2016-06-25
  Filled 2016-06-25: qty 20

## 2016-06-25 MED ORDER — PROPOFOL 10 MG/ML IV BOLUS
INTRAVENOUS | Status: AC
  Filled 2016-06-25: qty 40

## 2016-06-25 MED ORDER — LACTATED RINGERS IV SOLN
INTRAVENOUS | Status: DC
  Administered 2016-06-25: 1000 mL via INTRAVENOUS

## 2016-06-25 MED ORDER — SODIUM CHLORIDE 0.9 % IV SOLN: INTRAVENOUS | Status: DC

## 2016-06-25 MED ORDER — PROPOFOL 500 MG/50ML IV EMUL
INTRAVENOUS | Status: DC | PRN
  Administered 2016-06-25 (×3): 30 mg via INTRAVENOUS

## 2016-06-25 MED ORDER — PROPOFOL 500 MG/50ML IV EMUL
INTRAVENOUS | Status: DC | PRN
  Administered 2016-06-25: 150 ug/kg/min via INTRAVENOUS

## 2016-06-25 SURGICAL SUPPLY — 14 items

## 2016-06-25 NOTE — H&P (Signed)
[facility] History and Physical   Primary Care Physician:  Elby Showers, MD   Reason for Procedure:   Evaluate epigastric pain and f/u varices  Plan:    EGD, possible variceal ligation The risks and benefits as well as alternatives of endoscopic procedure(s) have been discussed and reviewed. All questions answered. The patient agrees to proceed.      HPI: Oscar [name] is a 51 year old male with PMH of NASH-cirrhosis (was an alcohol user also ? contribution) and esophageal varices presents with 1.5 year history of epigastric pain which radiates to RUQ and nausea after eating.  The abdominal pain lasts 10-15 mins; he denies any correlation with eating specific foods or being in certain positions.  He was taking Reglan for nausea but is not taking anything currently because it "stopped working."  He states he vomits about once every 2 months, with the last episode occurring in Sept-Oct when a trace amount of blood was seen.  He admits to occasional diarrhea, but no melena.  Denies NSAID or excessive caffeine use.  Takes Nexium daily for GERD.  Denies all other GI ROS.    Prior HIDA scan and imaging has failed to show a cause for abdominal pain.   Endoscopic evaluations - images viewed  Previous EGD's 2014 Gr2-3 esophageal varices and portal gastropathy 09/2013 - small flattend varices (had admit for hematemesis) 02/2014 - small varices and portal htn  Colonoscopies 01/2013 - 8 and 15 mm adenomas 02/2014 6 mm  10 mm polyps 1 adenoma 1 hyperplastic  Recent US led to MR and small liver lesion indeterminate for [place] - for 6 month f/u  Past Medical History:  Diagnosis Date  . Anemia   . Constipation    related to pain meds and takes Colace daily  . Esophageal varices ([place])   . Fatty liver   . Gastric varices    takes Propranlol for this  . GERD (gastroesophageal reflux disease)    takes Nexium daily  . GI bleed june 2015  . [id number])    "weekly"  (10/18/2013)  . Hematemesis 10/18/2013   hospitalized  . Hepatitis yrs ago   hx of Hep A  . History of colon polyps   . History of staph infection 2014  . Hypertension    takes Atenolol,HCTZ,and Lisinopril daily  . Insomnia    but doesn't take any meds  . ITP (idiopathic thrombocytopenic purpura) 05/16/2013  . Neck pain    HNP and radiculpathy  . Numbness hands and fingers   and tingling  . Obesity   . Osteoarthritis    "hands; elbows; knees" (10/18/2013)  . Pancytopenia ([place])    but never had a blood transfusion, had to take iron transfusion  sept 2014  . Sleep apnea    sleep study in epic from 2006 but doesn't use a cpap (10/18/2013)  . Type II diabetes mellitus (HCC)    takes Novolin and Novolog and takes Janumet daily  . Urinary frequency     Past Surgical History:  Procedure Laterality Date  . ANTERIOR CERVICAL DECOMP/DISCECTOMY FUSION N/A 05/30/2013   Procedure: Cervical five-six anterior cervical decompression with interbody prosthesis plating and bonegraft;  Surgeon: Ophelia Charter, MD;  Location: [facility];  Service: Neurosurgery;  Laterality: N/A;  Cervical five-six anterior cervical decompression with interbody prosthesis plating and bonegraft  . COLONOSCOPY WITH PROPOFOL N/A 02/07/2013   Procedure: COLONOSCOPY WITH PROPOFOL;  Surgeon: Arta Silence, MD;  [facility];  Service:  Endoscopy;  Laterality: N/A;  . COLONOSCOPY WITH PROPOFOL N/A 03/06/2014   Procedure: COLONOSCOPY WITH PROPOFOL;  Surgeon: Arta Silence, MD;  [facility];  Service: Endoscopy;  Laterality: N/A;  . ESOPHAGOGASTRODUODENOSCOPY N/A 10/19/2013   Procedure: ESOPHAGOGASTRODUODENOSCOPY (EGD);  Surgeon: Jeryl Columbia, MD;  Location: [facility] ENDOSCOPY;  Service: Endoscopy;  Laterality: N/A;  . ESOPHAGOGASTRODUODENOSCOPY (EGD) WITH PROPOFOL N/A 02/07/2013   Procedure: ESOPHAGOGASTRODUODENOSCOPY (EGD) WITH PROPOFOL;  Surgeon: Arta Silence, MD;  [facility];  Service:  Endoscopy;  Laterality: N/A;  . ESOPHAGOGASTRODUODENOSCOPY (EGD) WITH PROPOFOL N/A 03/06/2014   Procedure: ESOPHAGOGASTRODUODENOSCOPY (EGD) WITH PROPOFOL;  Surgeon: Arta Silence, MD;  [facility];  Service: Endoscopy;  Laterality: N/A;  . EUS N/A 03/06/2014   Procedure: ESOPHAGEAL ENDOSCOPIC ULTRASOUND (EUS) RADIAL;  Surgeon: Arta Silence, MD;  [facility];  Service: Endoscopy;  Laterality: N/A;  . INCISION AND DRAINAGE MOUTH  02/2013   " jaw area"  . WISDOM TOOTH EXTRACTION  ~  1997    Prior to Admission medications   Medication Sig Start Date End Date Taking? Authorizing Provider  acetaminophen (TYLENOL) 500 MG tablet Take 500-1,000 mg by mouth every 6 (six) hours as needed for moderate pain or headache.   Yes Historical Provider, MD  atenolol (TENORMIN) 50 MG tablet TAKE 1 TABLET DAILY 04/14/16  Yes Elby Showers, MD  esomeprazole (NEXIUM) 40 MG capsule Take 1 capsule (40 mg total) by mouth 2 (two) times daily before a meal. Patient taking differently: Take 20 mg by mouth daily.  10/19/13  Yes Reyne Dumas, MD  etodolac (LODINE) 400 MG tablet Take 400 mg by mouth daily as needed for mild pain.  04/12/16  Yes Historical Provider, MD  gabapentin (NEURONTIN) 300 MG capsule TAKE 2 CAPSULES DAILY AT BEDTIME FOR PERIPHERAL NEUROPATHY 01/28/16  Yes Elby Showers, MD  HUMULIN N 100 UNIT/ML injection Inject 20 Units into the skin 2 (two) times daily.  04/01/16  Yes Historical Provider, MD  hydrochlorothiazide (HYDRODIURIL) 25 MG tablet TAKE 1 TABLET EVERY MORNING 04/14/16  Yes Elby Showers, MD  JANUMET 50-1000 MG tablet TAKE 1 TABLET TWICE A DAY WITH MEALS 04/14/16  Yes Elby Showers, MD  lisinopril (PRINIVIL,ZESTRIL) 20 MG tablet TAKE 1 TABLET DAILY 04/14/16  Yes Elby Showers, MD  NOVOLOG FLEXPEN 100 UNIT/ML SOPN FlexPen Inject 10-30 Units into the skin 3 (three) times daily with meals. If blood sugar level is over 200 inject 30 units if under 200 inject 10 units 10/12/12   Yes Historical Provider, MD  sodium chloride (OCEAN) 0.65 % SOLN nasal spray Place 2 sprays into both nostrils daily as needed for congestion.   Yes Historical Provider, MD    Current Facility-Administered Medications  Medication Dose Route Frequency Provider Last Rate Last Dose  . 0.9 %  sodium chloride infusion   Intravenous Continuous Gatha Mayer, MD      . lactated ringers infusion   Intravenous Continuous Gatha Mayer, MD 125 mL/hr at 06/25/16 0657 1,000 mL at 06/25/16 0657    Allergies as of 05/10/2016 - Review Complete 05/10/2016  Allergen Reaction Noted  . Byetta 10 mcg pen [exenatide] Nausea And Vomiting 03/15/2013    Family History  Problem Relation Age of Onset  . Hypertension Mother   . Heart failure Mother   . Pancreatitis Father   . Kidney cancer Father   . Brain cancer Sister   . Lung cancer Sister   . Kidney disease Brother   . Diabetes  Brother     Social History   Social History  . Marital status: Married    Spouse name: N/A  . Number of children: 0  . Years of education: N/A   Occupational History  . truck Advice worker    Social History Main Topics  . Smoking status: Never Smoker  . Smokeless tobacco: Never Used     Comment: never used tobacco  . Alcohol use No     Comment: quit drinking compeletely in Oct 2014  . Drug use: No  . Sexual activity: Not Currently   Other Topics Concern  . Not on file   Social History Narrative  . No narrative on file    Review of Systems:  All other review of systems negative except as mentioned in the HPI.  Physical Exam: Vital signs in last 24 hours: Temp:  [98.2 F (36.8 C)] 98.2 F (36.8 C) (03/02 0646) Pulse Rate:  [72] 72 (03/02 0646) Resp:  [24] 24 (03/02 0646) BP: (135)/(81) 135/81 (03/02 0646) SpO2:  [99 %] 99 % (03/02 0646) Weight:  [273 lb (123.8 kg)] 273 lb (123.8 kg) (03/02 0646)   General:   Alert,  Well-developed, well-nourished, pleasant and cooperative in NAD Lungs:   Clear throughout to auscultation.   Heart:  Regular rate and rhythm; no murmurs, clicks, rubs,  or gallops. Abdomen:  Soft, obese and mildly tender epigastrium and nondistended. Normal bowel sounds.   Neuro/Psych:  Alert and cooperative. Normal mood and affect. A and O x 3   @Jeanine Caven  Simonne Maffucci, MD, [facility] [phone] (pager) 06/25/2016 8:10 AM@

## 2016-06-25 NOTE — Op Note (Signed)
[facility] Patient Name: Oscar [name] Procedure Date: 06/25/2016 MRN: [id number] Attending MD: Gatha Mayer , MD Date of Birth: [birth year] CSN: [id number] Age: 51 Admit Type: Outpatient Procedure:                Upper GI endoscopy Indications:              Epigastric abdominal pain, Remote Hematemesis? mild                            in past, Follow-up of esophageal varices Providers:                Gatha Mayer, MD, Cleda Daub, RN, William Dalton, Technician Referring MD:              Medicines:                Propofol per Anesthesia, Monitored Anesthesia Care Complications:            No immediate complications. Estimated Blood Loss:     Estimated blood loss was minimal. Procedure:                Pre-Anesthesia Assessment:                           - Prior to the procedure, a History and Physical                            was performed, and patient medications and                            allergies were reviewed. The patient's tolerance of                            previous anesthesia was also reviewed. The risks                            and benefits of the procedure and the sedation                            options and risks were discussed with the patient.                            All questions were answered, and informed consent                            was obtained. Prior Anticoagulants: The patient has                            taken no previous anticoagulant or antiplatelet                            agents. ASA Grade Assessment: III - A patient with  severe systemic disease. After reviewing the risks                            and benefits, the patient was deemed in                            satisfactory condition to undergo the procedure.                           After obtaining informed consent, the endoscope was                            passed under direct vision.  Throughout the                            procedure, the patient's blood pressure, pulse, and                            oxygen saturations were monitored continuously. The                            Endoscope was introduced through the mouth, and                            advanced to the second part of duodenum. The upper                            GI endoscopy was accomplished without difficulty.                            The patient tolerated the procedure well. Scope In: Scope Out: Findings:      Grade II, small (< 5 mm) varices were found in the lower third of the       esophagus. They were 4 mm in largest diameter. Estimated blood loss:       none.      Multiple small submucosal papules (nodules) and some enlarged folds with       erythematous mucosa (with no bleeding and no stigmata of recent bleeding       were found in the prepyloric region of the stomach. Biopsies were taken       of the firm flatter nodules with a cold forceps for histology.       Verification of patient identification for the specimen was done.       Estimated blood loss was minimal.      Mild portal hypertensive gastropathy was found in the cardia, in the       gastric fundus and in the gastric body. Snakeskin mucosa      The exam was otherwise without abnormality.      The cardia and gastric fundus were normal on retroflexion. Impression:               - Grade II and small (< 5 mm) esophageal varices.                            Do not think he has bled  from these.                           - Multiple submucosal papules (nodules), polypoid                            mucosal changes and some enlarged folds found in                            the stomach. Biopsied the flatter and firmer                            nodules - a few and avoided the soft compressible                            areas that could have been vascular based upon                            prior EUS. I have seen changes like this  associated                            with portal gastropathy in past - unusual location                            for varices - no varices seen in the proximal                            stomach. Could be gastritis also. Could have had                            the ?able minor hematenesis from these.                           - Portal hypertensive gastropathy.                           - The examination was otherwise normal. Moderate Sedation:      N/A- Per Anesthesia Care Recommendation:           - Patient has a contact number available for                            emergencies. The signs and symptoms of potential                            delayed complications were discussed with the                            patient. Return to normal activities tomorrow.                            Written discharge instructions were provided to the  patient.                           - Resume previous diet.                           - Continue present medications.                           - Await pathology results.                           - Repeat upper endoscopy Will decide after                            pathology review.                           - Return to my office Will arrange after pathology                            review. Procedure Code(s):        --- Professional ---                           [phone], Esophagogastroduodenoscopy, flexible,                            transoral; with biopsy, single or multiple Diagnosis Code(s):        --- Professional ---                           I85.00, Esophageal varices without bleeding                           K31.89, Other diseases of stomach and duodenum                           K76.6, Portal hypertension                           R10.13, Epigastric pain                           K92.0, Hematemesis CPT copyright 2016 American Medical Association. All rights reserved. The codes documented in this report are  preliminary and upon coder review may  be revised to meet current compliance requirements. Gatha Mayer, MD 06/25/2016 8:57:37 AM This report has been signed electronically. Number of Addenda: 0

## 2016-06-25 NOTE — Anesthesia Preprocedure Evaluation (Signed)
Anesthesia Evaluation  Patient identified by MRN, date of birth, ID band Patient awake    Reviewed: Allergy & Precautions, NPO status , Patient's Chart, lab work & pertinent test results  Airway Mallampati: II  TM Distance: >3 FB Neck ROM: Full    Dental no notable dental hx.    Pulmonary sleep apnea ,    Pulmonary exam normal breath sounds clear to auscultation       Cardiovascular hypertension, Normal cardiovascular exam Rhythm:Regular Rate:Normal     Neuro/Psych negative neurological ROS  negative psych ROS   GI/Hepatic negative GI ROS, (+) Cirrhosis   Esophageal Varices    ,   Endo/Other  diabetesMorbid obesity  Renal/GU negative Renal ROS  negative genitourinary   Musculoskeletal negative musculoskeletal ROS (+)   Abdominal   Peds negative pediatric ROS (+)  Hematology negative hematology ROS (+)   Anesthesia Other Findings   Reproductive/Obstetrics negative OB ROS                             Anesthesia Physical Anesthesia Plan  ASA: III  Anesthesia Plan: MAC   Post-op Pain Management:    Induction: Intravenous  Airway Management Planned: Nasal Cannula  Additional Equipment:   Intra-op Plan:   Post-operative Plan: Extubation in OR  Informed Consent: I have reviewed the patients History and Physical, chart, labs and discussed the procedure including the risks, benefits and alternatives for the proposed anesthesia with the patient or authorized representative who has indicated his/her understanding and acceptance.   Dental advisory given  Plan Discussed with: CRNA and Surgeon  Anesthesia Plan Comments:         Anesthesia Quick Evaluation

## 2016-06-25 NOTE — Anesthesia Postprocedure Evaluation (Signed)
Anesthesia Post Note  Patient: Oscar [name]  Procedure(s) Performed: Procedure(s) (LRB): ESOPHAGOGASTRODUODENOSCOPY (EGD) WITH PROPOFOL (N/A)  Patient location during evaluation: PACU Anesthesia Type: MAC Level of consciousness: awake and alert Pain management: pain level controlled Vital Signs Assessment: post-procedure vital signs reviewed and stable Respiratory status: spontaneous breathing, nonlabored ventilation, respiratory function stable and patient connected to nasal cannula oxygen Cardiovascular status: stable and blood pressure returned to baseline Anesthetic complications: no       Last Vitals:  Vitals:   06/25/16 0835 06/25/16 0840  BP:  108/67  Pulse: 79 73  Resp: (!) 30 (!) 26  Temp:      Last Pain:  Vitals:   06/25/16 0833  TempSrc: Oral                 Korissa Horsford S

## 2016-06-25 NOTE — Discharge Instructions (Signed)
° °  I found the dilated veins - esophageal varices - they look small and did not band them.  There were nodules and prominent folds in the stomach as before - I took a few biopsies of some of the nodules to see if might be related to pain and problems.  Will call with results and plans.  I appreciate the opportunity to care for you. Gatha Mayer, MD, FACG  YOU HAD AN ENDOSCOPIC PROCEDURE TODAY: Refer to the procedure report and other information in the discharge instructions given to you for any specific questions about what was found during the examination. If this information does not answer your questions, please call Dr. Celesta Aver office at [phone] to clarify.   YOU SHOULD EXPECT: Some feelings of bloating in the abdomen. Passage of more gas than usual. Walking can help get rid of the air that was put into your GI tract during the procedure and reduce the bloating. If you had a lower endoscopy (such as a colonoscopy or flexible sigmoidoscopy) you may notice spotting of blood in your stool or on the toilet paper. Some abdominal soreness may be present for a day or two, also.  DIET: Your first meal following the procedure should be a light meal and then it is ok to progress to your normal diet. A half-sandwich or bowl of soup is an example of a good first meal. Heavy or fried foods are harder to digest and may make you feel nauseous or bloated. Drink plenty of fluids but you should avoid alcoholic beverages for 24 hours.   ACTIVITY: Your care partner should take you home directly after the procedure. You should plan to take it easy, moving slowly for the rest of the day. You can resume normal activity the day after the procedure however YOU SHOULD NOT DRIVE, use power tools, machinery or perform tasks that involve climbing or major physical exertion for 24 hours (because of the sedation medicines used during the test).   SYMPTOMS TO REPORT IMMEDIATELY: A gastroenterologist can be  reached at any hour. Please call [phone]  for any of the following symptoms:  Following lower endoscopy (colonoscopy, flexible sigmoidoscopy) Excessive amounts of blood in the stool  Significant tenderness, worsening of abdominal pains  Swelling of the abdomen that is new, acute  Fever of 100 or higher  Following upper endoscopy (EGD, EUS, ERCP, esophageal dilation) Vomiting of blood or coffee ground material  New, significant abdominal pain  New, significant chest pain or pain under the shoulder blades  Painful or persistently difficult swallowing  New shortness of breath  Black, tarry-looking or red, bloody stools  FOLLOW UP:  If any biopsies were taken you will be contacted by phone or by letter within the next 1-3 weeks. Call [phone]  if you have not heard about the biopsies in 3 weeks.  Please also call with any specific questions about appointments or follow up tests.

## 2016-06-25 NOTE — Transfer of Care (Signed)
Immediate Anesthesia Transfer of Care Note  Patient: Oscar [name]  Procedure(s) Performed: Procedure(s): ESOPHAGOGASTRODUODENOSCOPY (EGD) WITH PROPOFOL (N/A)  Patient Location: PACU  Anesthesia Type:MAC  Level of Consciousness: sedated, patient cooperative and responds to stimulation  Airway & Oxygen Therapy: Patient Spontanous Breathing and Patient connected to nasal cannula oxygen  Post-op Assessment: Report given to RN and Post -op Vital signs reviewed and stable  Post vital signs: Reviewed and stable  Last Vitals:  Vitals:   06/25/16 0646  BP: 135/81  Pulse: 72  Resp: (!) 24  Temp: 36.8 C    Last Pain:  Vitals:   06/25/16 0646  TempSrc: Oral         Complications: No apparent anesthesia complications

## 2016-06-28 ENCOUNTER — Encounter (HOSPITAL_COMMUNITY): Payer: Self-pay | Admitting: Internal Medicine

## 2016-07-01 ENCOUNTER — Other Ambulatory Visit: Payer: Self-pay | Admitting: Internal Medicine

## 2016-07-04 ENCOUNTER — Other Ambulatory Visit: Payer: Self-pay | Admitting: Internal Medicine

## 2016-07-04 MED ORDER — DICYCLOMINE HCL 20 MG PO TABS: 20.0000 mg | ORAL_TABLET | Freq: Three times a day (TID) | ORAL | 0 refills | Status: DC

## 2016-07-04 NOTE — Progress Notes (Signed)
My Chart note to patient Please place EGD recall for 1 year Will try dicyclomine for pain

## 2016-07-05 ENCOUNTER — Other Ambulatory Visit: Payer: Self-pay | Admitting: Internal Medicine

## 2016-08-01 ENCOUNTER — Other Ambulatory Visit: Payer: Self-pay | Admitting: Internal Medicine

## 2016-08-02 NOTE — Telephone Encounter (Signed)
OK to refill x 6 mos

## 2016-08-02 NOTE — Telephone Encounter (Signed)
Okay to refill Sir? 

## 2016-09-23 ENCOUNTER — Other Ambulatory Visit: Payer: Self-pay | Admitting: Internal Medicine

## 2016-09-27 NOTE — Anesthesia Postprocedure Evaluation (Signed)
Anesthesia Post Note  Patient: Oscar [name]  Procedure(s) Performed: Procedure(s) (LRB): ESOPHAGOGASTRODUODENOSCOPY (EGD) WITH PROPOFOL (N/A)     Anesthesia Post Evaluation  Last Vitals:  Vitals:   06/25/16 0900 06/25/16 0905  BP: 121/69   Pulse: 68 70  Resp: (!) 28 (!) 26  Temp:      Last Pain:  Vitals:   06/28/16 1212  TempSrc:   PainSc: 0-No pain                 Oliveah Zwack S

## 2016-09-27 NOTE — Addendum Note (Signed)
Addendum  created 09/27/16 1152 by Myrtie Soman, MD   Sign clinical note

## 2016-09-28 ENCOUNTER — Telehealth: Payer: Self-pay

## 2016-09-28 MED ORDER — SITAGLIPTIN PHOS-METFORMIN HCL 50-1000 MG PO TABS: 1.0000 | ORAL_TABLET | Freq: Two times a day (BID) | ORAL | 0 refills | Status: DC

## 2016-09-28 MED ORDER — GABAPENTIN 300 MG PO CAPS: ORAL_CAPSULE | ORAL | 0 refills | Status: DC

## 2016-09-28 MED ORDER — ATENOLOL 50 MG PO TABS: 50.0000 mg | ORAL_TABLET | Freq: Every day | ORAL | 0 refills | Status: DC

## 2016-09-28 MED ORDER — HYDROCHLOROTHIAZIDE 25 MG PO TABS: 25.0000 mg | ORAL_TABLET | Freq: Every morning | ORAL | 0 refills | Status: DC

## 2016-09-28 MED ORDER — LISINOPRIL 20 MG PO TABS: 20.0000 mg | ORAL_TABLET | Freq: Every day | ORAL | 0 refills | Status: DC

## 2016-09-28 NOTE — Telephone Encounter (Signed)
Medication was refilled per Dr. Verlene Mayer request. Sent 90 day supply of HCTZ, Lisinopril, Gabapentin, Atenolol, Janumet to [facility]

## 2016-10-07 ENCOUNTER — Other Ambulatory Visit (HOSPITAL_BASED_OUTPATIENT_CLINIC_OR_DEPARTMENT_OTHER): Payer: BLUE CROSS/BLUE SHIELD

## 2016-10-07 ENCOUNTER — Ambulatory Visit (HOSPITAL_BASED_OUTPATIENT_CLINIC_OR_DEPARTMENT_OTHER): Payer: BLUE CROSS/BLUE SHIELD | Admitting: Family

## 2016-10-07 VITALS — BP 132/68 | HR 85 | Temp 98.1°F | Resp 18 | Wt 273.0 lb

## 2016-10-07 DIAGNOSIS — D508 Other iron deficiency anemias: Secondary | ICD-10-CM | POA: Diagnosis not present

## 2016-10-07 DIAGNOSIS — D509 Iron deficiency anemia, unspecified: Secondary | ICD-10-CM | POA: Diagnosis not present

## 2016-10-07 DIAGNOSIS — D696 Thrombocytopenia, unspecified: Secondary | ICD-10-CM

## 2016-10-07 DIAGNOSIS — D693 Immune thrombocytopenic purpura: Secondary | ICD-10-CM

## 2016-10-07 DIAGNOSIS — G629 Polyneuropathy, unspecified: Secondary | ICD-10-CM

## 2016-10-07 DIAGNOSIS — D72819 Decreased white blood cell count, unspecified: Secondary | ICD-10-CM

## 2016-10-07 DIAGNOSIS — K7581 Nonalcoholic steatohepatitis (NASH): Secondary | ICD-10-CM | POA: Diagnosis not present

## 2016-10-07 LAB — CBC WITH DIFFERENTIAL (CANCER CENTER ONLY)
BASO#: 0 10*3/uL (ref 0.0–0.2)
BASO%: 0.4 % (ref 0.0–2.0)
EOS ABS: 0.1 10*3/uL (ref 0.0–0.5)
EOS%: 2.5 % (ref 0.0–7.0)
HEMATOCRIT: 39.7 % (ref 38.7–49.9)
HEMOGLOBIN: 14.4 g/dL (ref 13.0–17.1)
LYMPH#: 1.1 10*3/uL (ref 0.9–3.3)
LYMPH%: 37.5 % (ref 14.0–48.0)
MCH: 32.4 pg (ref 28.0–33.4)
MCHC: 36.3 g/dL — AB (ref 32.0–35.9)
MCV: 89 fL (ref 82–98)
MONO#: 0.2 10*3/uL (ref 0.1–0.9)
MONO%: 8.2 % (ref 0.0–13.0)
NEUT%: 51.4 % (ref 40.0–80.0)
NEUTROS ABS: 1.4 10*3/uL — AB (ref 1.5–6.5)
Platelets: 81 10*3/uL — ABNORMAL LOW (ref 145–400)
RBC: 4.44 10*6/uL (ref 4.20–5.70)
RDW: 14.2 % (ref 11.1–15.7)
WBC: 2.8 10*3/uL — ABNORMAL LOW (ref 4.0–10.0)

## 2016-10-07 LAB — CMP (CANCER CENTER ONLY)
ALBUMIN: 3.5 g/dL (ref 3.3–5.5)
ALT(SGPT): 35 U/L (ref 10–47)
AST: 33 U/L (ref 11–38)
Alkaline Phosphatase: 129 U/L — ABNORMAL HIGH (ref 26–84)
BUN, Bld: 11 mg/dL (ref 7–22)
CALCIUM: 9.6 mg/dL (ref 8.0–10.3)
CHLORIDE: 102 meq/L (ref 98–108)
CO2: 25 mEq/L (ref 18–33)
CREATININE: 1 mg/dL (ref 0.6–1.2)
Glucose, Bld: 366 mg/dL — ABNORMAL HIGH (ref 73–118)
POTASSIUM: 4.1 meq/L (ref 3.3–4.7)
SODIUM: 136 meq/L (ref 128–145)
TOTAL PROTEIN: 6.8 g/dL (ref 6.4–8.1)
Total Bilirubin: 1.7 mg/dl — ABNORMAL HIGH (ref 0.20–1.60)

## 2016-10-07 LAB — CHCC SATELLITE - SMEAR

## 2016-10-07 NOTE — Progress Notes (Signed)
Hematology and Oncology Follow Up Visit  Oscar [name] [id number] [birth year] 51 y.o. 10/07/2016   Principle Diagnosis:  Chronic immune thrombocytopenia Cirrhosis Leukopenia Anemia secondary to intermittent GI bleeding  Current Therapy:   Nplate as needed for any procedure IV iron as indicated   Interim History:  Oscar [name] is here today for follow up. He is staying busy putting a deck on his house. He has no c/o fatigue at this time. Platelet count remains stable at 81. Hgb is 14.4 with an MCV of 89.  No episodes of bleeding, bruising or petechiae. No lymphadenopathy.  No issue with infection. No fever, chills, n/v, cough, rash, dizziness, SOB, chest pain, palpitations, abdominal pain or changes in bowel or bladder habits.  No swelling or tenderness in his extremities. The neuropathy in his feet is still present.  Blood sugars are still not well controlled. His last Hgb A1c was 10.2.  He has maintained a good appetite and is staying well hydrated. His weight is stable.   ECOG Performance Status: 1 - Symptomatic but completely ambulatory  Medications:  Allergies as of 10/07/2016      Reactions   Byetta 10 Mcg Pen [exenatide] Nausea And Vomiting      Medication List       Accurate as of 10/07/16  4:06 PM. Always use your most recent med list.          acetaminophen 500 MG tablet Commonly known as:  TYLENOL Take 500-1,000 mg by mouth every 6 (six) hours as needed for moderate pain or headache.   atenolol 50 MG tablet Commonly known as:  TENORMIN Take 1 tablet (50 mg total) by mouth daily.   dicyclomine 20 MG tablet Commonly known as:  BENTYL TAKE 1 TABLET (20 MG TOTAL) BY MOUTH 3 (THREE) TIMES DAILY BEFORE MEALS.   esomeprazole 40 MG capsule Commonly known as:  NEXIUM Take 1 capsule (40 mg total) by mouth 2 (two) times daily before a meal.   etodolac 400 MG tablet Commonly known as:  LODINE Take 400 mg by mouth daily as needed for mild pain.     gabapentin 300 MG capsule Commonly known as:  NEURONTIN TAKE 2 CAPSULES DAILY AT BEDTIME FOR PERIPHERAL NEUROPATHY   HUMULIN N 100 UNIT/ML injection Generic drug:  insulin NPH Human Inject 20 Units into the skin 2 (two) times daily.   hydrochlorothiazide 25 MG tablet Commonly known as:  HYDRODIURIL Take 1 tablet (25 mg total) by mouth every morning.   lisinopril 20 MG tablet Commonly known as:  PRINIVIL,ZESTRIL Take 1 tablet (20 mg total) by mouth daily.   NOVOLOG FLEXPEN 100 UNIT/ML FlexPen Generic drug:  insulin aspart Inject 10-30 Units into the skin 3 (three) times daily with meals. If blood sugar level is over 200 inject 30 units if under 200 inject 10 units   sitaGLIPtin-metformin 50-1000 MG tablet Commonly known as:  JANUMET Take 1 tablet by mouth 2 (two) times daily with a meal.   sodium chloride 0.65 % Soln nasal spray Commonly known as:  OCEAN Place 2 sprays into both nostrils daily as needed for congestion.       Allergies:  Allergies  Allergen Reactions  . Byetta 10 Mcg Pen [Exenatide] Nausea And Vomiting    Past Medical History, Surgical history, Social history, and Family History were reviewed and updated.  Review of Systems: All other 10 point review of systems is negative.   Physical Exam:  weight is 273 lb (123.8 kg). His oral temperature  is 98.1 F (36.7 C). His blood pressure is 132/68 and his pulse is 85. His respiration is 18 and oxygen saturation is 96%.   Wt Readings from Last 3 Encounters:  10/07/16 273 lb (123.8 kg)  06/25/16 273 lb (123.8 kg)  05/10/16 273 lb 8 oz (124.1 kg)    Ocular: Sclerae unicteric, pupils equal, round and reactive to light Ear-nose-throat: Oropharynx clear, dentition fair Lymphatic: No cervical, supraclavicular or axillary adenopathy Lungs no rales or rhonchi, good excursion bilaterally Heart regular rate and rhythm, no murmur appreciated Abd soft, nontender, positive bowel sounds, no liver or spleen tip  palpated on exam, no fluid wave  MSK no focal spinal tenderness, no joint edema Neuro: non-focal, well-oriented, appropriate affect Breasts: Deferred   Lab Results  Component Value Date   WBC 2.8 (L) 10/07/2016   HGB 14.4 10/07/2016   HCT 39.7 10/07/2016   MCV 89 10/07/2016   PLT 81 (L) 10/07/2016   Lab Results  Component Value Date   FERRITIN 14 (L) 04/08/2016   IRON 108 04/08/2016   TIBC 514 (H) 04/08/2016   UIBC 405 (H) 04/08/2016   IRONPCTSAT 21 04/08/2016   Lab Results  Component Value Date   RETICCTPCT 1.5 09/20/2014   RBC 4.44 10/07/2016   RETICCTABS 69.5 09/20/2014   No results found for: KPAFRELGTCHN, LAMBDASER, KAPLAMBRATIO No results found for: IGGSERUM, IGA, IGMSERUM No results found for: Ronnald Ramp, A1GS, A2GS, Violet Baldy, MSPIKE, SPEI   Chemistry      Component Value Date/Time   NA 136 10/07/2016 1517   K 4.1 10/07/2016 1517   CL 102 10/07/2016 1517   CO2 25 10/07/2016 1517   BUN 11 10/07/2016 1517   CREATININE 1.0 10/07/2016 1517      Component Value Date/Time   CALCIUM 9.6 10/07/2016 1517   ALKPHOS 129 (H) 10/07/2016 1517   AST 33 10/07/2016 1517   ALT 35 10/07/2016 1517   BILITOT 1.70 (H) 10/07/2016 1517      Impression and Plan: Oscar [name] is a very pleasant 51 yo gentleman with chronic ITP, NASH related. He also has history of iron deficeincy anemia. He is asymptomatic and has no complaints at this time. Platelet count is 82. No anemia. We will plan to see him back in another 6 months for repeat lab work and follow-up.   He will contact our office with any questions or concerns or if he plans to have surgery.   Eliezer Bottom, NP 6/14/20184:06 PM

## 2016-10-08 LAB — FERRITIN: FERRITIN: 15 ng/mL — AB (ref 22–316)

## 2016-10-08 LAB — RETICULOCYTES: RETICULOCYTE COUNT: 2.2 % (ref 0.6–2.6)

## 2016-10-08 LAB — IRON AND TIBC
%SAT: 21 % (ref 20–55)
Iron: 105 ug/dL (ref 42–163)
TIBC: 511 ug/dL — ABNORMAL HIGH (ref 202–409)
UIBC: 406 ug/dL — ABNORMAL HIGH (ref 117–376)

## 2016-10-19 DIAGNOSIS — E1165 Type 2 diabetes mellitus with hyperglycemia: Secondary | ICD-10-CM | POA: Diagnosis not present

## 2016-10-19 DIAGNOSIS — E7800 Pure hypercholesterolemia, unspecified: Secondary | ICD-10-CM | POA: Diagnosis not present

## 2016-10-19 DIAGNOSIS — K769 Liver disease, unspecified: Secondary | ICD-10-CM | POA: Diagnosis not present

## 2016-10-19 DIAGNOSIS — I10 Essential (primary) hypertension: Secondary | ICD-10-CM | POA: Diagnosis not present

## 2016-11-22 ENCOUNTER — Other Ambulatory Visit: Payer: BLUE CROSS/BLUE SHIELD | Admitting: Internal Medicine

## 2016-11-22 DIAGNOSIS — Z1329 Encounter for screening for other suspected endocrine disorder: Secondary | ICD-10-CM

## 2016-11-22 DIAGNOSIS — E8881 Metabolic syndrome: Secondary | ICD-10-CM | POA: Diagnosis not present

## 2016-11-22 DIAGNOSIS — E669 Obesity, unspecified: Secondary | ICD-10-CM

## 2016-11-22 DIAGNOSIS — I10 Essential (primary) hypertension: Secondary | ICD-10-CM

## 2016-11-22 DIAGNOSIS — E119 Type 2 diabetes mellitus without complications: Secondary | ICD-10-CM

## 2016-11-22 DIAGNOSIS — E785 Hyperlipidemia, unspecified: Secondary | ICD-10-CM | POA: Diagnosis not present

## 2016-11-22 DIAGNOSIS — Z0000 Encounter for general adult medical examination without abnormal findings: Secondary | ICD-10-CM

## 2016-11-22 LAB — COMPLETE METABOLIC PANEL WITH GFR
ALT: 18 U/L (ref 9–46)
AST: 20 U/L (ref 10–35)
Albumin: 3.7 g/dL (ref 3.6–5.1)
Alkaline Phosphatase: 123 U/L — ABNORMAL HIGH (ref 40–115)
BILIRUBIN TOTAL: 0.9 mg/dL (ref 0.2–1.2)
BUN: 12 mg/dL (ref 7–25)
CHLORIDE: 103 mmol/L (ref 98–110)
CO2: 23 mmol/L (ref 20–31)
CREATININE: 0.73 mg/dL (ref 0.70–1.33)
Calcium: 8.8 mg/dL (ref 8.6–10.3)
GFR, Est Non African American: >89 mL/min (ref >=60)
GLUCOSE: 184 mg/dL — AB (ref 65–99)
POTASSIUM: 4.2 mmol/L (ref 3.5–5.3)
SODIUM: 138 mmol/L (ref 135–146)
TOTAL PROTEIN: 6.5 g/dL (ref 6.1–8.1)

## 2016-11-22 LAB — CBC WITH DIFFERENTIAL/PLATELET
BASOS ABS: 0 {cells}/uL (ref 0–200)
Basophils Relative: 0 %
EOS ABS: 60 {cells}/uL (ref 15–500)
EOS PCT: 2 %
HCT: 43 % (ref 38.5–50.0)
Hemoglobin: 14.7 g/dL (ref 13.2–17.1)
LYMPHS PCT: 29 %
Lymphs Abs: 870 cells/uL (ref 850–3900)
MCH: 31.5 pg (ref 27.0–33.0)
MCHC: 34.2 g/dL (ref 32.0–36.0)
MCV: 92.3 fL (ref 80.0–100.0)
MONOS PCT: 8 %
MPV: 9.6 fL (ref 7.5–12.5)
Monocytes Absolute: 240 cells/uL (ref 200–950)
Neutro Abs: 1830 cells/uL (ref 1500–7800)
Neutrophils Relative %: 61 %
Platelets: 82 10*3/uL — ABNORMAL LOW (ref 140–400)
RBC: 4.66 MIL/uL (ref 4.20–5.80)
RDW: 15 % (ref 11.0–15.0)
WBC: 3 10*3/uL — ABNORMAL LOW (ref 3.8–10.8)

## 2016-11-22 LAB — LIPID PANEL
CHOL/HDL RATIO: 2.8 ratio (ref <5.0)
Cholesterol: 142 mg/dL (ref <200)
HDL: 51 mg/dL (ref >40)
LDL Cholesterol: 82 mg/dL (ref <100)
Triglycerides: 44 mg/dL (ref <150)
VLDL: 9 mg/dL (ref <30)

## 2016-11-22 LAB — TSH: TSH: 1.22 mIU/L (ref 0.40–4.50)

## 2016-11-23 ENCOUNTER — Ambulatory Visit (INDEPENDENT_AMBULATORY_CARE_PROVIDER_SITE_OTHER): Payer: BLUE CROSS/BLUE SHIELD | Admitting: Internal Medicine

## 2016-11-23 ENCOUNTER — Encounter: Payer: Self-pay | Admitting: Internal Medicine

## 2016-11-23 VITALS — BP 128/70 | HR 91 | Temp 99.1°F | Ht 68.0 in | Wt 267.0 lb

## 2016-11-23 DIAGNOSIS — K219 Gastro-esophageal reflux disease without esophagitis: Secondary | ICD-10-CM

## 2016-11-23 DIAGNOSIS — Z86010 Personal history of colonic polyps: Secondary | ICD-10-CM | POA: Diagnosis not present

## 2016-11-23 DIAGNOSIS — E784 Other hyperlipidemia: Secondary | ICD-10-CM

## 2016-11-23 DIAGNOSIS — K7030 Alcoholic cirrhosis of liver without ascites: Secondary | ICD-10-CM

## 2016-11-23 DIAGNOSIS — D708 Other neutropenia: Secondary | ICD-10-CM | POA: Diagnosis not present

## 2016-11-23 DIAGNOSIS — Z794 Long term (current) use of insulin: Secondary | ICD-10-CM

## 2016-11-23 DIAGNOSIS — E1142 Type 2 diabetes mellitus with diabetic polyneuropathy: Secondary | ICD-10-CM

## 2016-11-23 DIAGNOSIS — Z0000 Encounter for general adult medical examination without abnormal findings: Secondary | ICD-10-CM

## 2016-11-23 DIAGNOSIS — E8881 Metabolic syndrome: Secondary | ICD-10-CM | POA: Diagnosis not present

## 2016-11-23 DIAGNOSIS — E559 Vitamin D deficiency, unspecified: Secondary | ICD-10-CM

## 2016-11-23 DIAGNOSIS — K7460 Unspecified cirrhosis of liver: Secondary | ICD-10-CM | POA: Diagnosis not present

## 2016-11-23 DIAGNOSIS — E7849 Other hyperlipidemia: Secondary | ICD-10-CM

## 2016-11-23 DIAGNOSIS — E119 Type 2 diabetes mellitus without complications: Secondary | ICD-10-CM

## 2016-11-23 DIAGNOSIS — D696 Thrombocytopenia, unspecified: Secondary | ICD-10-CM | POA: Diagnosis not present

## 2016-11-23 DIAGNOSIS — I8510 Secondary esophageal varices without bleeding: Secondary | ICD-10-CM | POA: Diagnosis not present

## 2016-11-23 DIAGNOSIS — I10 Essential (primary) hypertension: Secondary | ICD-10-CM | POA: Diagnosis not present

## 2016-11-23 DIAGNOSIS — IMO0001 Reserved for inherently not codable concepts without codable children: Secondary | ICD-10-CM

## 2016-11-23 LAB — MICROALBUMIN / CREATININE URINE RATIO: CREATININE, URINE: 67 mg/dL (ref 20–370)

## 2016-11-23 LAB — VITAMIN D 25 HYDROXY (VIT D DEFICIENCY, FRACTURES): Vit D, 25-Hydroxy: 14 ng/mL — ABNORMAL LOW (ref 30–100)

## 2016-11-23 LAB — POCT URINALYSIS DIPSTICK
BILIRUBIN UA: NEGATIVE
Glucose, UA: 200
Ketones, UA: NEGATIVE
Leukocytes, UA: NEGATIVE
NITRITE UA: NEGATIVE
PH UA: 5 (ref 5.0–8.0)
Protein, UA: NEGATIVE
RBC UA: NEGATIVE
SPEC GRAV UA: 1.025 (ref 1.010–1.025)
UROBILINOGEN UA: 0.2 U/dL

## 2016-11-23 LAB — HEMOGLOBIN A1C
Hgb A1c MFr Bld: 9 % — ABNORMAL HIGH (ref <5.7)
Mean Plasma Glucose: 212 mg/dL

## 2016-11-23 NOTE — Progress Notes (Addendum)
Subjective:    Patient ID: Oscar [name], male    DOB: May 31, 1965, 51 y.o.   MRN: [id number]  HPI 51 year old White Male for health maintenance exam and evaluation of medical problems. He has a history of hypertension, insulin-dependent diabetes mellitus treated by Dr. Chalmers Cater, endocrinologist. History of cirrhosis. Has esophageal varices. History of leukopenia and thrombocytopenia thought to be related to alcohol. He sees Dr.Ennever for this. History of obstructive sleep apnea, obesity, GE reflux. Multiple admissions in the past for GI bleeding but none recently.  Had upper endoscopy for some violence [employer] season March. Had grade 2 and small-- less than 5 mm esophageal varices. No varices in proximal stomach. Thought to have portal hypertensive gastropathy. Had colonoscopy in 2015. On colonoscopy had polyp removed and post polypectomy bleeding due to cirrhosis. Hemostasis achieved after placement of clips. These polyps work tubular adenomas and hyperplastic polyps with 3-5 year follow-up recommended.  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  Social history: Married. Says he no longer drinks alcohol and does not use drugs. Nonsmoker.  Prevnar pneumococcal vaccines are up-to-date.  In 2015 he had a C5-C6 anterior dissector me by Dr. Arnoldo Morale  History of sleep apnea treated by Dr. Annamaria Boots in 2006 with C Pap  Intolerant of Byetta-it causes nausea and vomiting.  Dr. Bing Plume was his eye physician but his insurance has changed any says he needs to find another provider. Needs to have annual diabetic eye exam. His been about a year since he had that.  No known drug allergies  Diabetes treated with NovoLog 70/30 mix-says he takes 40 units before meals and Janumet 50/1000 twice daily. Hypertension regimen consist of atenolol HCTZ and lisinopril. He also takes Nexium. Takes Lodine for musculoskeletal pain. Takes  Neurontin for peripheral neuropathy.  Family history: Father died from complications of surgery for kidney cancer. Mother with history of hypertension. 9 siblings. No children.    Review of Systems  Constitutional: Negative.   Respiratory: Negative.   Cardiovascular: Negative.   Gastrointestinal: Negative.   Neurological: Negative.   Psychiatric/Behavioral: Negative.        Objective:   Physical Exam  Constitutional: He is oriented to person, place, and time. He appears well-developed and well-nourished. No distress.  HENT:  Head: Normocephalic and atraumatic.  Right Ear: External ear normal.  Left Ear: External ear normal.  Mouth/Throat: Oropharynx is clear and moist.  Eyes: Right eye exhibits no discharge. Left eye exhibits no discharge.  Neck: Neck supple. No JVD present. No thyromegaly present.  Cardiovascular: Normal rate, regular rhythm, normal heart sounds and intact distal pulses.   No murmur heard. Pulmonary/Chest: Effort normal and breath sounds normal. No respiratory distress. He has no wheezes.  Abdominal: Soft. Bowel sounds are normal. He exhibits no distension and no mass. There is no tenderness. There is no rebound and no guarding.  Distended and obese  Genitourinary: Prostate normal.  Musculoskeletal: He exhibits no edema.  Lymphadenopathy:    He has no cervical adenopathy.  Neurological: He is alert and oriented to person, place, and time. No cranial nerve deficit. Coordination normal.  Skin: No rash noted. He is not diaphoretic.  Psychiatric: He has a normal mood and affect. His behavior is normal. Judgment and thought content normal.  Vitals reviewed.         Assessment & Plan:  Essential hypertension  Insulin-dependent diabetes mellitus-hemoglobin A1c 9% and a year ago  was 10.2%. Sees Dr. Chalmers Cater several times yearly.  History of cirrhosis  Esophageal varices  Thrombocytopenia and leukopenia followed by hematologist. Dema Severin blood cell count 3001  month ago was 2800. Platelet count 82,000 and hemoglobin 14.7 g.  GE reflux  History of gastroparesis tried on Reglan but he currently is not taking that  Obesity  Fatty liver  Multiple admissions for GI bleed  History of alcohol abuse but denies drinking at the present time  Vitamin D deficiency-recommend that he take 2000 units vitamin D 3 daily.  Plan: Continue planned endoscopy surveillance for varices. Continue same medications. Return in one year or as needed. Continue close follow-up with endocrinologist.

## 2016-11-23 NOTE — Patient Instructions (Signed)
Was a pleasure to see you today. Continue to work on diet and exercise. Still need to lose weight. Continue same medications and follow-up regularly with endocrinologist. Otherwise return in one year or as needed.

## 2016-12-08 ENCOUNTER — Other Ambulatory Visit: Payer: Self-pay | Admitting: Internal Medicine

## 2016-12-14 ENCOUNTER — Telehealth: Payer: Self-pay | Admitting: Internal Medicine

## 2016-12-14 DIAGNOSIS — K7469 Other cirrhosis of liver: Secondary | ICD-10-CM

## 2016-12-14 DIAGNOSIS — K769 Liver disease, unspecified: Secondary | ICD-10-CM

## 2016-12-14 NOTE — Telephone Encounter (Signed)
Patient has been scheduled for 12/22/16 5:00 at [facility]  He will need to be NPO after 1:00.  His wife notified of the appt details.

## 2016-12-17 ENCOUNTER — Other Ambulatory Visit: Payer: Self-pay | Admitting: Internal Medicine

## 2016-12-22 ENCOUNTER — Ambulatory Visit (HOSPITAL_COMMUNITY)
Admission: RE | Admit: 2016-12-22 | Discharge: 2016-12-22 | Disposition: A | Discharge status: Home / Self Care | Payer: BLUE CROSS/BLUE SHIELD | Source: Ambulatory Visit | Attending: Internal Medicine | Admitting: Internal Medicine

## 2016-12-22 DIAGNOSIS — K7460 Unspecified cirrhosis of liver: Secondary | ICD-10-CM | POA: Diagnosis not present

## 2016-12-22 DIAGNOSIS — K766 Portal hypertension: Secondary | ICD-10-CM | POA: Insufficient documentation

## 2016-12-22 DIAGNOSIS — I8510 Secondary esophageal varices without bleeding: Secondary | ICD-10-CM | POA: Diagnosis not present

## 2016-12-22 DIAGNOSIS — K7469 Other cirrhosis of liver: Secondary | ICD-10-CM | POA: Diagnosis not present

## 2016-12-22 DIAGNOSIS — R161 Splenomegaly, not elsewhere classified: Secondary | ICD-10-CM | POA: Diagnosis not present

## 2016-12-22 DIAGNOSIS — K769 Liver disease, unspecified: Secondary | ICD-10-CM | POA: Insufficient documentation

## 2016-12-22 MED ORDER — GADOBENATE DIMEGLUMINE 529 MG/ML IV SOLN
20.0000 mL | Freq: Once | INTRAVENOUS | Status: AC | PRN
  Administered 2016-12-22: 20 mL via INTRAVENOUS

## 2016-12-24 NOTE — Progress Notes (Signed)
My Chart note to patient MRI repeat 1 year - RN to enter reminder Will ask patient to call for an appointment

## 2017-01-28 ENCOUNTER — Telehealth: Payer: Self-pay

## 2017-01-28 MED ORDER — DICYCLOMINE HCL 20 MG PO TABS: 20.0000 mg | ORAL_TABLET | Freq: Three times a day (TID) | ORAL | 1 refills | Status: DC

## 2017-01-28 NOTE — Telephone Encounter (Signed)
Refill x 6 mos 

## 2017-01-28 NOTE — Telephone Encounter (Signed)
[facility] is requesting 90 day supply refill on patient's Dicyclomine 20mg  tablets, Ok to refill Sir?

## 2017-01-28 NOTE — Telephone Encounter (Signed)
Dicyclomine refilled for 6 months.

## 2017-02-17 DIAGNOSIS — E119 Type 2 diabetes mellitus without complications: Secondary | ICD-10-CM | POA: Diagnosis not present

## 2017-02-17 DIAGNOSIS — H52203 Unspecified astigmatism, bilateral: Secondary | ICD-10-CM | POA: Diagnosis not present

## 2017-02-17 DIAGNOSIS — Z794 Long term (current) use of insulin: Secondary | ICD-10-CM | POA: Diagnosis not present

## 2017-02-17 DIAGNOSIS — H524 Presbyopia: Secondary | ICD-10-CM | POA: Diagnosis not present

## 2017-02-17 DIAGNOSIS — H2513 Age-related nuclear cataract, bilateral: Secondary | ICD-10-CM | POA: Diagnosis not present

## 2017-02-17 DIAGNOSIS — H25013 Cortical age-related cataract, bilateral: Secondary | ICD-10-CM | POA: Diagnosis not present

## 2017-02-17 DIAGNOSIS — H5203 Hypermetropia, bilateral: Secondary | ICD-10-CM | POA: Diagnosis not present

## 2017-03-03 DIAGNOSIS — K769 Liver disease, unspecified: Secondary | ICD-10-CM | POA: Diagnosis not present

## 2017-03-03 DIAGNOSIS — I10 Essential (primary) hypertension: Secondary | ICD-10-CM | POA: Diagnosis not present

## 2017-03-03 DIAGNOSIS — E1165 Type 2 diabetes mellitus with hyperglycemia: Secondary | ICD-10-CM | POA: Diagnosis not present

## 2017-03-03 DIAGNOSIS — E7800 Pure hypercholesterolemia, unspecified: Secondary | ICD-10-CM | POA: Diagnosis not present

## 2017-03-04 ENCOUNTER — Other Ambulatory Visit: Payer: Self-pay | Admitting: Internal Medicine

## 2017-03-08 ENCOUNTER — Other Ambulatory Visit: Payer: Self-pay | Admitting: Internal Medicine

## 2017-03-17 ENCOUNTER — Other Ambulatory Visit: Payer: Self-pay | Admitting: Internal Medicine

## 2017-03-22 ENCOUNTER — Encounter: Payer: Self-pay | Admitting: Internal Medicine

## 2017-03-22 ENCOUNTER — Other Ambulatory Visit (INDEPENDENT_AMBULATORY_CARE_PROVIDER_SITE_OTHER): Payer: BLUE CROSS/BLUE SHIELD

## 2017-03-22 ENCOUNTER — Ambulatory Visit: Payer: BLUE CROSS/BLUE SHIELD | Admitting: Internal Medicine

## 2017-03-22 DIAGNOSIS — R1013 Epigastric pain: Secondary | ICD-10-CM

## 2017-03-22 DIAGNOSIS — K7469 Other cirrhosis of liver: Secondary | ICD-10-CM | POA: Diagnosis not present

## 2017-03-22 DIAGNOSIS — I8510 Secondary esophageal varices without bleeding: Secondary | ICD-10-CM | POA: Diagnosis not present

## 2017-03-22 DIAGNOSIS — K219 Gastro-esophageal reflux disease without esophagitis: Secondary | ICD-10-CM

## 2017-03-22 LAB — COMPREHENSIVE METABOLIC PANEL
ALK PHOS: 87 U/L (ref 39–117)
ALT: 21 U/L (ref 0–53)
AST: 26 U/L (ref 0–37)
Albumin: 3.9 g/dL (ref 3.5–5.2)
BILIRUBIN TOTAL: 1.4 mg/dL — AB (ref 0.2–1.2)
BUN: 13 mg/dL (ref 6–23)
CO2: 25 meq/L (ref 19–32)
Calcium: 9.6 mg/dL (ref 8.4–10.5)
Chloride: 99 mEq/L (ref 96–112)
Creatinine, Ser: 0.87 mg/dL (ref 0.40–1.50)
GFR: 98.49 mL/min (ref >60.00)
GLUCOSE: 327 mg/dL — AB (ref 70–99)
Potassium: 3.9 mEq/L (ref 3.5–5.1)
SODIUM: 133 meq/L — AB (ref 135–145)
TOTAL PROTEIN: 7.3 g/dL (ref 6.0–8.3)

## 2017-03-22 LAB — PROTIME-INR
INR: 1.2 ratio — ABNORMAL HIGH (ref 0.8–1.0)
Prothrombin Time: 12.7 s (ref 9.6–13.1)

## 2017-03-22 LAB — CBC
HCT: 42.8 % (ref 39.0–52.0)
HEMOGLOBIN: 14.9 g/dL (ref 13.0–17.0)
MCHC: 34.8 g/dL (ref 30.0–36.0)
MCV: 91.8 fl (ref 78.0–100.0)
PLATELETS: 103 10*3/uL — AB (ref 150.0–400.0)
RBC: 4.66 Mil/uL (ref 4.22–5.81)
RDW: 15.6 % — AB (ref 11.5–15.5)
WBC: 3.6 10*3/uL — ABNORMAL LOW (ref 4.0–10.5)

## 2017-03-22 MED ORDER — DICYCLOMINE HCL 20 MG PO TABS: 20.0000 mg | ORAL_TABLET | Freq: Three times a day (TID) | ORAL | 3 refills | Status: DC

## 2017-03-22 NOTE — Patient Instructions (Signed)
   Keep working on the blood sugars!  Go to basement for labs - electrolytes, kidney function, blood counts and blood clotting check.  I refilled the dicyclomine.  I appreciate the opportunity to care for you. Gatha Mayer, MD, [facility]

## 2017-03-22 NOTE — Assessment & Plan Note (Signed)
Continue nexium 

## 2017-03-22 NOTE — Progress Notes (Signed)
Oscar [name] 51 y.o. 1965-05-08 [id number]  Assessment & Plan:   Encounter Diagnoses  Name Primary?  . Other cirrhosis of liver ([place])   . Secondary esophageal varices without bleeding ([place])   . Gastroesophageal reflux disease, esophagitis presence not specified   . Dyspepsia      He seems stable overall.  I am concerned about his diabetes and control.  Nonbloody areas getting a continuous monitor.  Recheck labs today.  Plan for repeat cross-sectional imaging next year.  He gets an EGD next year for routine follow-up of varices as well.  He will continue his current medications and I will refill his dicyclomine.  I appreciate the opportunity to care for this patient. CC: Elby Showers, MD  Subjective:   Chief Complaint:  HPI The patient is here with his wife his symptoms of intermittent upper abdominal pain nausea and occasional diarrhea are stable.  Dicyclomine and acid suppression medications help this.  He would like a refill on his dicyclomine.  His diabetes is not well controlled at this time probably, he is going to get a continuous monitor and hopeful that that will help.   Dicyclomine helps - needs refill  Getting cont BS monitor  Other sxs about the same Allergies  Allergen Reactions  . Byetta 10 Mcg Pen [Exenatide] Nausea And Vomiting   Current Meds  Medication Sig  . acetaminophen (TYLENOL) 500 MG tablet Take 500-1,000 mg by mouth every 6 (six) hours as needed for moderate pain or headache.  Marland Kitchen atenolol (TENORMIN) 50 MG tablet TAKE 1 TABLET DAILY  . esomeprazole (NEXIUM) 40 MG capsule Take 1 capsule (40 mg total) by mouth 2 (two) times daily before a meal. (Patient taking differently: Take 20 mg by mouth daily. )  . gabapentin (NEURONTIN) 300 MG capsule TAKE 2 CAPSULES DAILY AT BEDTIME FOR PERIPHERAL NEUROPATHY  . hydrochlorothiazide (HYDRODIURIL) 25 MG tablet TAKE 1 TABLET EVERY MORNING  . JANUMET 50-1000 MG tablet TAKE 1 TABLET TWICE A DAY WITH  MEALS  . lisinopril (PRINIVIL,ZESTRIL) 20 MG tablet TAKE 1 TABLET DAILY  . naproxen sodium (ALEVE) 220 MG tablet Take 220 mg by mouth as needed.  Marland Kitchen NOVOLOG MIX 70/30 FLEXPEN (70-30) 100 UNIT/ML FlexPen   . sodium chloride (OCEAN) 0.65 % SOLN nasal spray Place 2 sprays into both nostrils daily as needed for congestion.   Past Medical History:  Diagnosis Date  . Anemia   . Constipation    related to pain meds and takes Colace daily  . Esophageal varices ([place])   . Fatty liver   . Gastric varices    takes Propranlol for this  . GERD (gastroesophageal reflux disease)    takes Nexium daily  . GI bleed june 2015  . TDVVOHYW(737.1)    "weekly" (10/18/2013)  . Hematemesis 10/18/2013   hospitalized  . Hepatitis yrs ago   hx of Hep A  . History of colon polyps   . History of staph infection 2014  . Hypertension    takes Atenolol,HCTZ,and Lisinopril daily  . Insomnia    but doesn't take any meds  . Liver cirrhosis secondary to NASH ([place]) + alcohol use? contribution 02/21/2011  . Neck pain    HNP and radiculpathy  . Numbness hands and fingers   and tingling  . Obesity   . Osteoarthritis    "hands; elbows; knees" (10/18/2013)  . Pancytopenia ([place])    but never had a blood transfusion, had to take iron transfusion  sept 2014  .  Sleep apnea    sleep study in epic from 2006 but doesn't use a cpap (10/18/2013)  . Thrombocytopenia ([place]) in cirrhosis and portal hypertension 06/25/2016  . Type II diabetes mellitus (H[facility])    takes Novolin and Novolog and takes Janumet daily  . Urinary frequency    Past Surgical History:  Procedure Laterality Date  . ANTERIOR CERVICAL DECOMP/DISCECTOMY FUSION N/A 05/30/2013   Procedure: Cervical five-six anterior cervical decompression with interbody prosthesis plating and bonegraft;  Surgeon: Ophelia Charter, MD;  Location: [facility];  Service: Neurosurgery;  Laterality: N/A;  Cervical five-six anterior cervical decompression with interbody prosthesis plating  and bonegraft  . COLONOSCOPY WITH PROPOFOL N/A 02/07/2013   Procedure: COLONOSCOPY WITH PROPOFOL;  Surgeon: Arta Silence, MD;  [facility];  Service: Endoscopy;  Laterality: N/A;  . COLONOSCOPY WITH PROPOFOL N/A 03/06/2014   Procedure: COLONOSCOPY WITH PROPOFOL;  Surgeon: Arta Silence, MD;  Location[facility];  Service: Endoscopy;  Laterality: N/A;  . ESOPHAGOGASTRODUODENOSCOPY N/A 10/19/2013   Procedure: ESOPHAGOGASTRODUODENOSCOPY (EGD);  Surgeon: Jeryl Columbia, MD;  [facility] END[facility]COPY;  Service: Endoscopy;  Laterality: N/A;  . ESOPHAGOGASTRODUODENOSCOPY (EGD) WITH PROPOFOL N/A 02/07/2013   Procedure: ESOPHAGOGASTRODUODENOSCOPY (EGD) WITH PROPOFOL;  Surgeon: Arta Silence, MD;  [facility];  Service: Endoscopy;  Laterality: N/A;  . ESOPHAGOGASTRODUODENOSCOPY (EGD) WITH PROPOFOL N/A 03/06/2014   Procedure: ESOPHAGOGASTRODUODENOSCOPY (EGD) WITH PROPOFOL;  Surgeon: Arta Silence, MD;  [facility];  Service: Endoscopy;  Laterality: N/A;  . ESOPHAGOGASTRODUODENOSCOPY (EGD) WITH PROPOFOL N/A 06/25/2016   Procedure: ESOPHAGOGASTRODUODENOSCOPY (EGD) WITH PROPOFOL;  Surgeon: Gatha Mayer, MD;  [facility];  Service: Endoscopy;  Laterality: N/A;  . EUS N/A 03/06/2014   Procedure: ESOPHAGEAL ENDOSCOPIC ULTRASOUND (EUS) RADIAL;  Surgeon: Arta Silence, MD;  [facility];  Service: Endoscopy;  Laterality: N/A;  . INCISION AND DRAINAGE MOUTH  02/2013   " jaw area"  . 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   Review of Systems As per  HPI  Objective:   Physical Exam BP 120/70   Pulse 84   Ht 5\' 8"  (1.727 m)   Wt 277 lb 9.6 oz (125.9 kg)   BMI 42.21 kg/m  Obese NAD Anicteric Lungs cta Ht s1s2 no rmg abd obese - morbidly, soft and NT Ext no c/c/e Alert and oriented x 3

## 2017-03-22 NOTE — Assessment & Plan Note (Signed)
Dicyclomine refill.

## 2017-03-22 NOTE — Assessment & Plan Note (Signed)
Recheck labs today. 

## 2017-03-22 NOTE — Assessment & Plan Note (Signed)
EGD approx March 2019

## 2017-03-23 ENCOUNTER — Telehealth: Payer: Self-pay | Admitting: Internal Medicine

## 2017-03-23 NOTE — Telephone Encounter (Signed)
Hx of poorly controlled diabetes followed by Dr. Chalmers Cater, endocrinologist. Serum glucose elevated in 300 range at Gastroenterologist. Call pt about this and see when last appt with Dr. Chalmers Cater was. Faxed results of labs to Dr. Chalmers Cater.

## 2017-03-23 NOTE — Progress Notes (Signed)
Labs ok overall except glucose way to high. He is aware of need to have better glucose control. May need to see Dr. Renold Genta sooner than planned My Chart message sent

## 2017-03-24 ENCOUNTER — Encounter: Payer: Self-pay | Admitting: Internal Medicine

## 2017-03-24 ENCOUNTER — Telehealth: Payer: Self-pay | Admitting: Internal Medicine

## 2017-03-24 NOTE — Telephone Encounter (Signed)
Phone call to Endocrinologist, Dr. Almetta Lovely office. Pt has appt there for March 2019  and was just seen there recently. Faxed labs over to Dr. Almetta Lovely office and let them know his glucose was elevated recently with GI procedure.

## 2017-03-28 NOTE — Telephone Encounter (Signed)
Called patient at [phone] and left message for him to call me back.

## 2017-04-07 ENCOUNTER — Ambulatory Visit: Payer: BLUE CROSS/BLUE SHIELD | Admitting: Hematology & Oncology

## 2017-04-07 ENCOUNTER — Other Ambulatory Visit: Payer: BLUE CROSS/BLUE SHIELD

## 2017-05-05 ENCOUNTER — Inpatient Hospital Stay (HOSPITAL_BASED_OUTPATIENT_CLINIC_OR_DEPARTMENT_OTHER): Payer: BLUE CROSS/BLUE SHIELD | Admitting: Family

## 2017-05-05 ENCOUNTER — Other Ambulatory Visit: Payer: Self-pay

## 2017-05-05 ENCOUNTER — Inpatient Hospital Stay: Payer: BLUE CROSS/BLUE SHIELD | Attending: Hematology & Oncology

## 2017-05-05 ENCOUNTER — Encounter: Payer: Self-pay | Admitting: Family

## 2017-05-05 VITALS — BP 141/66 | HR 77 | Temp 98.5°F | Resp 18 | Wt 285.0 lb

## 2017-05-05 DIAGNOSIS — Z79899 Other long term (current) drug therapy: Secondary | ICD-10-CM

## 2017-05-05 DIAGNOSIS — G629 Polyneuropathy, unspecified: Secondary | ICD-10-CM

## 2017-05-05 DIAGNOSIS — K7581 Nonalcoholic steatohepatitis (NASH): Secondary | ICD-10-CM | POA: Insufficient documentation

## 2017-05-05 DIAGNOSIS — Z7984 Long term (current) use of oral hypoglycemic drugs: Secondary | ICD-10-CM | POA: Diagnosis not present

## 2017-05-05 DIAGNOSIS — D508 Other iron deficiency anemias: Secondary | ICD-10-CM

## 2017-05-05 DIAGNOSIS — D693 Immune thrombocytopenic purpura: Secondary | ICD-10-CM | POA: Insufficient documentation

## 2017-05-05 DIAGNOSIS — D500 Iron deficiency anemia secondary to blood loss (chronic): Secondary | ICD-10-CM | POA: Diagnosis not present

## 2017-05-05 DIAGNOSIS — D72819 Decreased white blood cell count, unspecified: Secondary | ICD-10-CM

## 2017-05-05 DIAGNOSIS — K7460 Unspecified cirrhosis of liver: Secondary | ICD-10-CM | POA: Diagnosis not present

## 2017-05-05 DIAGNOSIS — E119 Type 2 diabetes mellitus without complications: Secondary | ICD-10-CM

## 2017-05-05 DIAGNOSIS — D696 Thrombocytopenia, unspecified: Secondary | ICD-10-CM

## 2017-05-05 LAB — COMPREHENSIVE METABOLIC PANEL
ALT: 30 U/L (ref 10–47)
AST: 23 U/L (ref 11–38)
Albumin: 3.6 g/dL (ref 3.5–5.0)
Alkaline Phosphatase: 105 U/L — ABNORMAL HIGH (ref 26–84)
Anion gap: 9 (ref 5–15)
BILIRUBIN TOTAL: 1.1 mg/dL (ref 0.2–1.6)
BUN: 9 mg/dL (ref 7–22)
CALCIUM: 9.2 mg/dL (ref 8.0–10.3)
CO2: 28 mmol/L (ref 18–33)
Chloride: 105 mmol/L (ref 98–108)
Creatinine, Ser: 1 mg/dL (ref 0.60–1.20)
Glucose, Bld: 217 mg/dL — ABNORMAL HIGH (ref 70–118)
Potassium: 4 mmol/L (ref 3.3–4.7)
Sodium: 142 mmol/L (ref 128–145)
TOTAL PROTEIN: 6.5 g/dL (ref 6.4–8.1)

## 2017-05-05 LAB — CBC WITH DIFFERENTIAL (CANCER CENTER ONLY)
BASOS ABS: 0 10*3/uL (ref 0.0–0.1)
Basophils Relative: 0 %
Eosinophils Absolute: 0.1 10*3/uL (ref 0.0–0.5)
Eosinophils Relative: 3 %
HEMATOCRIT: 37.8 % — AB (ref 38.7–49.9)
HEMOGLOBIN: 13.5 g/dL (ref 13.0–17.1)
Lymphocytes Relative: 31 %
Lymphs Abs: 0.9 10*3/uL (ref 0.9–3.3)
MCH: 32.2 pg (ref 28.0–33.4)
MCHC: 35.7 g/dL (ref 32.0–35.9)
MCV: 90.2 fL (ref 82.0–98.0)
Monocytes Absolute: 0.2 10*3/uL (ref 0.1–0.9)
Monocytes Relative: 8 %
NEUTROS ABS: 1.7 10*3/uL (ref 1.5–6.5)
NEUTROS PCT: 58 %
Platelet Count: 81 10*3/uL — ABNORMAL LOW (ref 140–400)
RBC: 4.19 MIL/uL — AB (ref 4.20–5.70)
RDW: 13.5 % (ref 11.1–15.7)
WBC: 2.8 10*3/uL — AB (ref 4.0–10.3)

## 2017-05-05 NOTE — Progress Notes (Signed)
Hematology and Oncology Follow Up Visit  ARLIND KLINGERMAN [id number] 1965-07-18 52 y.o. 05/05/2017   Principle Diagnosis: Chronic immune thrombocytopenia Cirrhosis Leukopenia Anemia secondary to intermittent GI bleeding  Current Therapy:   Nplate as needed for any procedure IV iron as indicated   Interim History:  Mr. Kwasnik is here today for follow-up. He is doing well and has no complaints at this time.  He has had no episodes of bleeding, bruising or petechiae. Platelets are 81, Hgb is 13.5 and WBC count is 2.8.  No lymphadenopathy found on exam.  He denies any episodes of No fever, chills, n/v, cough, rash, dizziness, SOB, chest pain, palpitations, abdominal pain or changes in bowel or bladder habits.  Neuropathy in his hands and feet is unchanged. No swelling or tenderness in his extremities. No c/o pain at this time.  He has maintained a good appetite and is staying well hydrated. His weight is stable.  He is monitoring his blood sugars and states that these have been better.   ECOG Performance Status: 1 - Symptomatic but completely ambulatory  Medications:  Allergies as of 05/05/2017      Reactions   Byetta 10 Mcg Pen [exenatide] Nausea And Vomiting      Medication List        Accurate as of 05/05/17  3:53 PM. Always use your most recent med list.          acetaminophen 500 MG tablet Commonly known as:  TYLENOL Take 500-1,000 mg by mouth every 6 (six) hours as needed for moderate pain or headache.   atenolol 50 MG tablet Commonly known as:  TENORMIN TAKE 1 TABLET DAILY   dicyclomine 20 MG tablet Commonly known as:  BENTYL Take 1 tablet (20 mg total) by mouth 3 (three) times daily before meals.   esomeprazole 40 MG capsule Commonly known as:  NEXIUM Take 1 capsule (40 mg total) by mouth 2 (two) times daily before a meal.   gabapentin 300 MG capsule Commonly known as:  NEURONTIN TAKE 2 CAPSULES DAILY AT BEDTIME FOR PERIPHERAL NEUROPATHY     hydrochlorothiazide 25 MG tablet Commonly known as:  HYDRODIURIL TAKE 1 TABLET EVERY MORNING   JANUMET 50-1000 MG tablet Generic drug:  sitaGLIPtin-metformin TAKE 1 TABLET TWICE A DAY WITH MEALS   lisinopril 20 MG tablet Commonly known as:  PRINIVIL,ZESTRIL TAKE 1 TABLET DAILY   naproxen sodium 220 MG tablet Commonly known as:  ALEVE Take 220 mg by mouth as needed.   NOVOLOG MIX 70/30 FLEXPEN (70-30) 100 UNIT/ML FlexPen Generic drug:  insulin aspart protamine - aspart   sodium chloride 0.65 % Soln nasal spray Commonly known as:  OCEAN Place 2 sprays into both nostrils daily as needed for congestion.       Allergies:  Allergies  Allergen Reactions  . Byetta 10 Mcg Pen [Exenatide] Nausea And Vomiting    Past Medical History, Surgical history, Social history, and Family History were reviewed and updated.  Review of Systems: All other 10 point review of systems is negative.   Physical Exam:  vitals were not taken for this visit.   Wt Readings from Last 3 Encounters:  03/22/17 277 lb 9.6 oz (125.9 kg)  11/23/16 267 lb (121.1 kg)  10/07/16 273 lb (123.8 kg)    Ocular: Sclerae unicteric, pupils equal, round and reactive to light Ear-nose-throat: Oropharynx clear, dentition fair Lymphatic: No cervical, supraclavicular or axillary adenopathy Lungs no rales or rhonchi, good excursion bilaterally Heart regular rate and rhythm, no  murmur appreciated Abd soft, nontender, positive bowel sounds, no liver or spleen tip palpated on exam, no fluid wave  MSK no focal spinal tenderness, no joint edema Neuro: non-focal, well-oriented, appropriate affect Breasts: Deferred   Lab Results  Component Value Date   WBC 3.6 (L) 03/22/2017   HGB 14.9 03/22/2017   HCT 37.8 (L) 05/05/2017   MCV 90.2 05/05/2017   PLT 103.0 (L) 03/22/2017   Lab Results  Component Value Date   FERRITIN 15 (L) 10/07/2016   IRON 105 10/07/2016   TIBC 511 (H) 10/07/2016   UIBC 406 (H) 10/07/2016    IRONPCTSAT 21 10/07/2016   Lab Results  Component Value Date   RETICCTPCT 1.5 09/20/2014   RBC 4.19 (L) 05/05/2017   RETICCTABS 69.5 09/20/2014   No results found for: KPAFRELGTCHN, LAMBDASER, KAPLAMBRATIO No results found for: IGGSERUM, IGA, IGMSERUM No results found for: Kathrynn Ducking, MSPIKE, SPEI   Chemistry      Component Value Date/Time   NA 133 (L) 03/22/2017 1712   NA 136 10/07/2016 1517   K 3.9 03/22/2017 1712   K 4.1 10/07/2016 1517   CL 99 03/22/2017 1712   CL 102 10/07/2016 1517   CO2 25 03/22/2017 1712   CO2 25 10/07/2016 1517   BUN 13 03/22/2017 1712   BUN 11 10/07/2016 1517   CREATININE 0.87 03/22/2017 1712   CREATININE 0.73 11/22/2016 1006      Component Value Date/Time   CALCIUM 9.6 03/22/2017 1712   CALCIUM 9.6 10/07/2016 1517   ALKPHOS 87 03/22/2017 1712   ALKPHOS 129 (H) 10/07/2016 1517   AST 26 03/22/2017 1712   AST 33 10/07/2016 1517   ALT 21 03/22/2017 1712   ALT 35 10/07/2016 1517   BILITOT 1.4 (H) 03/22/2017 1712   BILITOT 1.70 (H) 10/07/2016 1517      Impression and Plan: Mr. Phill Mutter is a very pleasant 52 yo gentleman with chronic NASH related ITP. He continues to do well and platelet count is stable at 81.  We will see what his iron studies show and bring him back in next week for an infusion if needed.  We will continue to follow along with him and plan to see him back in another 6 months.  He will contact our office with any questions or concerns and will let us know if he ever needs surgery. We can certainly see him sooner if need be.   Laverna Peace, NP 1/10/20193:53 PM

## 2017-05-06 LAB — RETICULOCYTES
RBC.: 4.23 MIL/uL (ref 4.20–5.82)
RETIC COUNT ABSOLUTE: 93.1 10*3/uL (ref 34.8–93.9)
RETIC CT PCT: 2.2 % — AB (ref 0.8–1.8)

## 2017-05-06 LAB — IRON AND TIBC
Iron: 91 ug/dL (ref 42–163)
SATURATION RATIOS: 18 % — AB (ref 42–163)
TIBC: 498 ug/dL — AB (ref 202–409)
UIBC: 406 ug/dL

## 2017-05-06 LAB — FERRITIN: Ferritin: 14 ng/mL — ABNORMAL LOW (ref 22–316)

## 2017-06-02 ENCOUNTER — Other Ambulatory Visit: Payer: Self-pay | Admitting: Internal Medicine

## 2017-06-02 NOTE — Telephone Encounter (Signed)
Please call CPE due after August 31.Can refill after PE booked.

## 2017-06-02 NOTE — Telephone Encounter (Signed)
Left message to call back to schedule CPE. 

## 2017-06-06 ENCOUNTER — Telehealth: Payer: Self-pay

## 2017-06-06 MED ORDER — DICYCLOMINE HCL 20 MG PO TABS: 20.0000 mg | ORAL_TABLET | Freq: Three times a day (TID) | ORAL | 3 refills | Status: DC

## 2017-06-06 NOTE — Telephone Encounter (Signed)
Refilled as approved. 

## 2017-06-06 NOTE — Telephone Encounter (Signed)
OK to refill x 1 year 

## 2017-06-06 NOTE — Telephone Encounter (Signed)
Pharmacy refill request faxed to [place] for a 90 day supply of patients dicyclomine tabs.  Seen in November. May I send this in Sir?

## 2017-06-15 ENCOUNTER — Other Ambulatory Visit: Payer: Self-pay | Admitting: Internal Medicine

## 2017-06-15 NOTE — Telephone Encounter (Signed)
Refill through August

## 2017-06-29 DIAGNOSIS — E1165 Type 2 diabetes mellitus with hyperglycemia: Secondary | ICD-10-CM | POA: Diagnosis not present

## 2017-06-29 DIAGNOSIS — I10 Essential (primary) hypertension: Secondary | ICD-10-CM | POA: Diagnosis not present

## 2017-06-29 DIAGNOSIS — E7800 Pure hypercholesterolemia, unspecified: Secondary | ICD-10-CM | POA: Diagnosis not present

## 2017-07-12 ENCOUNTER — Encounter: Payer: Self-pay | Admitting: Internal Medicine

## 2017-08-16 DIAGNOSIS — E7800 Pure hypercholesterolemia, unspecified: Secondary | ICD-10-CM | POA: Diagnosis not present

## 2017-08-16 DIAGNOSIS — E1165 Type 2 diabetes mellitus with hyperglycemia: Secondary | ICD-10-CM | POA: Diagnosis not present

## 2017-08-16 DIAGNOSIS — I10 Essential (primary) hypertension: Secondary | ICD-10-CM | POA: Diagnosis not present

## 2017-08-16 DIAGNOSIS — K769 Liver disease, unspecified: Secondary | ICD-10-CM | POA: Diagnosis not present

## 2017-08-28 ENCOUNTER — Other Ambulatory Visit: Payer: Self-pay | Admitting: Internal Medicine

## 2017-09-14 ENCOUNTER — Ambulatory Visit: Payer: BLUE CROSS/BLUE SHIELD | Admitting: Internal Medicine

## 2017-09-14 ENCOUNTER — Encounter: Payer: Self-pay | Admitting: Internal Medicine

## 2017-09-14 ENCOUNTER — Other Ambulatory Visit (INDEPENDENT_AMBULATORY_CARE_PROVIDER_SITE_OTHER): Payer: BLUE CROSS/BLUE SHIELD

## 2017-09-14 VITALS — BP 114/64 | HR 68 | Ht 67.5 in | Wt 290.4 lb

## 2017-09-14 DIAGNOSIS — K769 Liver disease, unspecified: Secondary | ICD-10-CM

## 2017-09-14 DIAGNOSIS — I8510 Secondary esophageal varices without bleeding: Secondary | ICD-10-CM

## 2017-09-14 DIAGNOSIS — K7469 Other cirrhosis of liver: Secondary | ICD-10-CM | POA: Diagnosis not present

## 2017-09-14 DIAGNOSIS — K219 Gastro-esophageal reflux disease without esophagitis: Secondary | ICD-10-CM | POA: Diagnosis not present

## 2017-09-14 LAB — CBC WITH DIFFERENTIAL/PLATELET
BASOS PCT: 0.3 % (ref 0.0–3.0)
Basophils Absolute: 0 10*3/uL (ref 0.0–0.1)
Eosinophils Absolute: 0.1 10*3/uL (ref 0.0–0.7)
Eosinophils Relative: 2.7 % (ref 0.0–5.0)
HEMATOCRIT: 39.9 % (ref 39.0–52.0)
Hemoglobin: 13.9 g/dL (ref 13.0–17.0)
Lymphocytes Relative: 29.2 % (ref 12.0–46.0)
Lymphs Abs: 1 10*3/uL (ref 0.7–4.0)
MCHC: 34.9 g/dL (ref 30.0–36.0)
MCV: 92.5 fl (ref 78.0–100.0)
MONOS PCT: 9.1 % (ref 3.0–12.0)
Monocytes Absolute: 0.3 10*3/uL (ref 0.1–1.0)
NEUTROS ABS: 2.1 10*3/uL (ref 1.4–7.7)
Neutrophils Relative %: 58.7 % (ref 43.0–77.0)
PLATELETS: 82 10*3/uL — AB (ref 150.0–400.0)
RBC: 4.31 Mil/uL (ref 4.22–5.81)
RDW: 16 % — AB (ref 11.5–15.5)
WBC: 3.6 10*3/uL — ABNORMAL LOW (ref 4.0–10.5)

## 2017-09-14 LAB — COMPREHENSIVE METABOLIC PANEL
ALT: 15 U/L (ref 0–53)
AST: 17 U/L (ref 0–37)
Albumin: 3.7 g/dL (ref 3.5–5.2)
Alkaline Phosphatase: 87 U/L (ref 39–117)
BUN: 15 mg/dL (ref 6–23)
CALCIUM: 9.4 mg/dL (ref 8.4–10.5)
CHLORIDE: 104 meq/L (ref 96–112)
CO2: 28 meq/L (ref 19–32)
CREATININE: 0.94 mg/dL (ref 0.40–1.50)
GFR: 89.91 mL/min (ref >60.00)
Glucose, Bld: 262 mg/dL — ABNORMAL HIGH (ref 70–99)
Potassium: 4 mEq/L (ref 3.5–5.1)
Sodium: 137 mEq/L (ref 135–145)
Total Bilirubin: 1.1 mg/dL (ref 0.2–1.2)
Total Protein: 6.6 g/dL (ref 6.0–8.3)

## 2017-09-14 LAB — PROTIME-INR
INR: 1.3 ratio — AB (ref 0.8–1.0)
Prothrombin Time: 15.4 s — ABNORMAL HIGH (ref 9.6–13.1)

## 2017-09-14 NOTE — Progress Notes (Signed)
Oscar [name] 52 y.o. [birth year] [id number]  Assessment & Plan:   Encounter Diagnoses  Name Primary?  . Other cirrhosis of liver ([place]) Yes  . Liver lesion   . Secondary esophageal varices without bleeding ([place])   . Gastroesophageal reflux disease, esophagitis presence not specified    Go ahead with MRI of the liver.  Follow-up this liver lesion.  Once I have those results we will decide next steps as far as endoscopic follow-up with EGD.  Determine timing of a colonoscopy also.  Continue current medications.  GI medications include Nexium and dicyclomine.  Labs to include CBC CMET pro time alpha-fetoprotein today.  These have returned and his white count is 3.6, platelets 82,000 hemoglobin normal alpha-fetoprotein 3.9 INR is 1.3 and a normal CMET except a glucose of 262.  His abdominal symptoms may very well be due to hyperglycemia and uncontrolled type 2 diabetes mellitus.  I appreciate the opportunity to care for this patient. CC: Elby Showers, MD   Subjective:   Chief Complaint: Epigastric pain, nausea vomiting cirrhosis  HPI The patient is here with his wife and continues to have some episodic epigastric pain nausea vomiting.  This has been helped by anticholinergic medication in the past.  He is monitored GERD with history of esophageal varices and no bleeding, cirrhosis and a small liver lesion.  When last here I had suggested he work more on getting sugars under control.  He thinks that they are improved.  He had esophageal varices grade 2, portal gastropathy changes on EGD last year and is due for another one colonoscopy in November 2015 with tubular adenomas and hyperplastic polyps.  In range for repeat.  Could wait till 2020.  Comorbidities have impact here.  MR abdomen in August 2018 showed stable changes of cirrhosis with portal venous hypertension and splenomegaly, and a 7 mm early arterial phase enhancing lesion in segment 4 AA that could be a dysplastic  nodule small HCC versus a vascular shunt and recommended follow-up in 6 to 12 months. He does not drink alcohol anymore. Wt Readings from Last 3 Encounters:  09/14/17 290 lb 6 oz (131.7 kg)  05/05/17 285 lb (129.3 kg)  03/22/17 277 lb 9.6 oz (125.9 kg)     Allergies  Allergen Reactions  . Byetta 10 Mcg Pen [Exenatide] Nausea And Vomiting   Current Meds  Medication Sig  . acetaminophen (TYLENOL) 500 MG tablet Take 500-1,000 mg by mouth every 6 (six) hours as needed for moderate pain or headache.  Marland Kitchen atenolol (TENORMIN) 50 MG tablet TAKE 1 TABLET DAILY  . dicyclomine (BENTYL) 20 MG tablet Take 1 tablet (20 mg total) by mouth 3 (three) times daily before meals.  Marland Kitchen esomeprazole (NEXIUM) 40 MG capsule Take 1 capsule (40 mg total) by mouth 2 (two) times daily before a meal. (Patient taking differently: Take 20 mg by mouth daily. )  . gabapentin (NEURONTIN) 300 MG capsule TAKE 2 CAPSULES DAILY AT BEDTIME FOR PERIPHERAL NEUROPATHY  . hydrochlorothiazide (HYDRODIURIL) 25 MG tablet TAKE 1 TABLET EVERY MORNING  . JANUMET 50-1000 MG tablet TAKE 1 TABLET TWICE A DAY WITH MEALS  . lisinopril (PRINIVIL,ZESTRIL) 20 MG tablet TAKE 1 TABLET DAILY  . naproxen sodium (ALEVE) 220 MG tablet Take 220 mg by mouth as needed.  Marland Kitchen NOVOLOG MIX 70/30 FLEXPEN (70-30) 100 UNIT/ML FlexPen Inject 40 Units into the skin 3 (three) times daily.   . sodium chloride (OCEAN) 0.65 % SOLN nasal spray Place 2 sprays into both  nostrils daily as needed for congestion.   Past Medical History:  Diagnosis Date  . Anemia   . Constipation    related to pain meds and takes Colace daily  . Esophageal varices ([place])   . Fatty liver   . Gastric varices    takes Propranlol for this  . GERD (gastroesophageal reflux disease)    takes Nexium daily  . GI bleed june 2015  . VZDGLOVF(643.3)    "weekly" (10/18/2013)  . Hematemesis 10/18/2013   hospitalized  . Hepatitis yrs ago   hx of Hep A  . History of colon polyps   . History of  staph infection 2014  . Hypertension    takes Atenolol,HCTZ,and Lisinopril daily  . Insomnia    but doesn't take any meds  . Liver cirrhosis secondary to NASH ([place]) + alcohol use? contribution 02/21/2011  . Neck pain    HNP and radiculpathy  . Numbness hands and fingers   and tingling  . Obesity   . Osteoarthritis    "hands; elbows; knees" (10/18/2013)  . Pancytopenia ([place])    but never had a blood transfusion, had to take iron transfusion  sept 2014  . Sleep apnea    sleep study in epic from 2006 but doesn't use a cpap (10/18/2013)  . Thrombocytopenia ([place]) in cirrhosis and portal hypertension 06/25/2016  . Type II diabetes mellitus (H[facility])    takes Novolin and Novolog and takes Janumet daily  . Urinary frequency    Past Surgical History:  Procedure Laterality Date  . ANTERIOR CERVICAL DECOMP/DISCECTOMY FUSION N/A 05/30/2013   Procedure: Cervical five-six anterior cervical decompression with interbody prosthesis plating and bonegraft;  Surgeon: Ophelia Charter, MD;  Location: [place];  Service: Neurosurgery;  Laterality: N/A;  Cervical five-six anterior cervical decompression with interbody prosthesis plating and bonegraft  . COLONOSCOPY WITH PROPOFOL N/A 02/07/2013   Procedure: COLONOSCOPY WITH PROPOFOL;  Surgeon: Arta Silence, MD;  [facility];  Service: Endoscopy;  Laterality: N/A;  . COLONOSCOPY WITH PROPOFOL N/A 03/06/2014   Procedure: COLONOSCOPY WITH PROPOFOL;  Surgeon: Arta Silence, MD;  [facility];  Service: Endoscopy;  Laterality: N/A;  . ESOPHAGOGASTRODUODENOSCOPY N/A 10/19/2013   Procedure: ESOPHAGOGASTRODUODENOSCOPY (EGD);  Surgeon: Jeryl Columbia, MD;  Location: [facility] ENDOSCOPY;  Service: Endoscopy;  Laterality: N/A;  . ESOPHAGOGASTRODUODENOSCOPY (EGD) WITH PROPOFOL N/A 02/07/2013   Procedure: ESOPHAGOGASTRODUODENOSCOPY (EGD) WITH PROPOFOL;  Surgeon: Arta Silence, MD;  [facility];  Service: Endoscopy;  Laterality: N/A;  .  ESOPHAGOGASTRODUODENOSCOPY (EGD) WITH PROPOFOL N/A 03/06/2014   Procedure: ESOPHAGOGASTRODUODENOSCOPY (EGD) WITH PROPOFOL;  Surgeon: Arta Silence, MD;  [facility];  Service: Endoscopy;  Laterality: N/A;  . ESOPHAGOGASTRODUODENOSCOPY (EGD) WITH PROPOFOL N/A 06/25/2016   Procedure: ESOPHAGOGASTRODUODENOSCOPY (EGD) WITH PROPOFOL;  Surgeon: Gatha Mayer, MD;  [facility];  Service: Endoscopy;  Laterality: N/A;  . EUS N/A 03/06/2014   Procedure: ESOPHAGEAL ENDOSCOPIC ULTRASOUND (EUS) RADIAL;  Surgeon: Arta Silence, MD;  [facility];  Service: Endoscopy;  Laterality: N/A;  . INCISION AND DRAINAGE MOUTH  02/2013   " jaw area"  . 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   Review of Systems As per HPI  Objective:   Physical Exam BP 114/64 (BP Location: Left Arm, Patient Position: Sitting, Cuff Size: Normal)   Pulse 68   Ht 5' 7.5" (1.715 m)   Wt 290 lb 6 oz (131.7 kg)   BMI 44.81 kg/m  No acute distress morbidly obese Eyes are anicteric Lungs are clear Heart sounds are distant The abdomen is very obese soft nontender I cannot detect hepatospleno megaly or mass Trace edema in the lower extremities without cyanosis or clubbing He appears alert and oriented x3 without signs of encephalopathy or asterixis.

## 2017-09-14 NOTE — Assessment & Plan Note (Signed)
Stable it seems Weight gain could be ascites but could also be from insulin

## 2017-09-14 NOTE — Patient Instructions (Signed)
  Your provider has requested that you go to the basement level for lab work before leaving today. Press "B" on the elevator. The lab is located at the first door on the left as you exit the elevator.   You have been scheduled for an MRI at [facility] on 09/26/17. Your appointment time is 2:50. Please arrive 20 minutes prior to your appointment time for registration purposes. Please make certain not to have anything to eat or drink 4 hours prior to your test. In addition, if you have any metal in your body, have a pacemaker or defibrillator, please be sure to let your ordering physician know. This test typically takes 45 minutes to 1 hour to complete. Should you need to reschedule, please call [phone] to do so.  Bring photo ID and insurance cards.    I appreciate the opportunity to care for you. Silvano Rusk, MD, [facility]

## 2017-09-14 NOTE — Assessment & Plan Note (Signed)
Needs EGD f/u but await MR

## 2017-09-15 LAB — AFP TUMOR MARKER: AFP-Tumor Marker: 3.9 ng/mL (ref <6.1)

## 2017-09-16 DIAGNOSIS — M25562 Pain in left knee: Secondary | ICD-10-CM | POA: Diagnosis not present

## 2017-09-16 DIAGNOSIS — M170 Bilateral primary osteoarthritis of knee: Secondary | ICD-10-CM | POA: Diagnosis not present

## 2017-09-16 DIAGNOSIS — M25561 Pain in right knee: Secondary | ICD-10-CM | POA: Diagnosis not present

## 2017-09-21 NOTE — Progress Notes (Signed)
Notable abnormalities glucose 262 my chart message to the patient on importance of improving this As hard as that may be  recommended follow-up with PCP

## 2017-09-23 DIAGNOSIS — M25562 Pain in left knee: Secondary | ICD-10-CM | POA: Diagnosis not present

## 2017-09-23 DIAGNOSIS — M25561 Pain in right knee: Secondary | ICD-10-CM | POA: Diagnosis not present

## 2017-09-26 ENCOUNTER — Ambulatory Visit
Admission: RE | Admit: 2017-09-26 | Discharge: 2017-09-26 | Disposition: A | Discharge status: Home / Self Care | Payer: BLUE CROSS/BLUE SHIELD | Source: Ambulatory Visit | Attending: Internal Medicine | Admitting: Internal Medicine

## 2017-09-26 DIAGNOSIS — K769 Liver disease, unspecified: Secondary | ICD-10-CM

## 2017-09-26 DIAGNOSIS — K7469 Other cirrhosis of liver: Secondary | ICD-10-CM

## 2017-09-26 MED ORDER — GADOBENATE DIMEGLUMINE 529 MG/ML IV SOLN
20.0000 mL | Freq: Once | INTRAVENOUS | Status: AC | PRN
  Administered 2017-09-26: 20 mL via INTRAVENOUS

## 2017-09-26 NOTE — Progress Notes (Signed)
Let him know the liver nodule is stable - that is good news as cancers grow and this is not growing  I think his stomach symptoms are most likely due to uncontrolled diabetes causing delayed stomach emptying though Janumet might be contributing.  1) When did he last see Dr. Chalmers Cater of endocrinology? 2) Repeat MR abdomen (liver) with and w/o contrast 1 year 09/2018 3) Schedule 4 hr gastric emptying scan re: nausea and vomiting, Type 2 DM, uncontrolled 4) ask him to do a lipase and amylase and a Hgb A1 C - dx epigastric pain, type 2 DM, uncontrolled 5) needs EGD and colonoscopy ok to do at [facility] - f/u varices, hx colon polyps

## 2017-09-27 ENCOUNTER — Other Ambulatory Visit: Payer: Self-pay

## 2017-09-27 DIAGNOSIS — E118 Type 2 diabetes mellitus with unspecified complications: Secondary | ICD-10-CM

## 2017-09-27 DIAGNOSIS — R1013 Epigastric pain: Secondary | ICD-10-CM

## 2017-10-03 ENCOUNTER — Telehealth: Payer: Self-pay | Admitting: Internal Medicine

## 2017-10-03 NOTE — Telephone Encounter (Signed)
Patient is provided the number to central scheduling .

## 2017-10-03 NOTE — Telephone Encounter (Signed)
Patient wife calling to see if pt can get his gastric emptying appt rescheduled from 6.12.19 to sometime in July. Pt wife states okay to leave vm on mobile or home #.

## 2017-10-05 ENCOUNTER — Ambulatory Visit (HOSPITAL_COMMUNITY): Payer: BLUE CROSS/BLUE SHIELD

## 2017-10-20 DIAGNOSIS — M2241 Chondromalacia patellae, right knee: Secondary | ICD-10-CM | POA: Diagnosis not present

## 2017-10-20 DIAGNOSIS — M2242 Chondromalacia patellae, left knee: Secondary | ICD-10-CM | POA: Diagnosis not present

## 2017-10-31 ENCOUNTER — Encounter (HOSPITAL_COMMUNITY)
Admission: RE | Admit: 2017-10-31 | Discharge: 2017-10-31 | Disposition: A | Discharge status: Home / Self Care | Payer: BLUE CROSS/BLUE SHIELD | Source: Ambulatory Visit | Attending: Internal Medicine | Admitting: Internal Medicine

## 2017-10-31 ENCOUNTER — Encounter: Payer: Self-pay | Admitting: Internal Medicine

## 2017-10-31 ENCOUNTER — Other Ambulatory Visit: Payer: Self-pay | Admitting: Internal Medicine

## 2017-10-31 DIAGNOSIS — K219 Gastro-esophageal reflux disease without esophagitis: Secondary | ICD-10-CM | POA: Diagnosis not present

## 2017-10-31 DIAGNOSIS — K3184 Gastroparesis: Secondary | ICD-10-CM

## 2017-10-31 DIAGNOSIS — R1013 Epigastric pain: Secondary | ICD-10-CM | POA: Diagnosis not present

## 2017-10-31 DIAGNOSIS — E1143 Type 2 diabetes mellitus with diabetic autonomic (poly)neuropathy: Secondary | ICD-10-CM

## 2017-10-31 DIAGNOSIS — E118 Type 2 diabetes mellitus with unspecified complications: Secondary | ICD-10-CM | POA: Diagnosis not present

## 2017-10-31 HISTORY — DX: Type 2 diabetes mellitus with diabetic autonomic (poly)neuropathy: E11.43

## 2017-10-31 MED ORDER — METOCLOPRAMIDE HCL 10 MG PO TABS: 10.0000 mg | ORAL_TABLET | Freq: Three times a day (TID) | ORAL | 1 refills | Status: DC

## 2017-10-31 MED ORDER — TECHNETIUM TC 99M SULFUR COLLOID
1.9600 | Freq: Once | INTRAVENOUS | Status: AC | PRN
  Administered 2017-10-31: 1.96 via INTRAVENOUS

## 2017-10-31 NOTE — Progress Notes (Signed)
Gastroparesis  Stop dicyclomine Try reglan 10 mg ac

## 2017-11-01 ENCOUNTER — Inpatient Hospital Stay: Payer: BLUE CROSS/BLUE SHIELD

## 2017-11-01 ENCOUNTER — Inpatient Hospital Stay: Payer: BLUE CROSS/BLUE SHIELD | Attending: Family | Admitting: Family

## 2017-11-01 VITALS — BP 97/54 | HR 68 | Temp 98.1°F | Wt 293.1 lb

## 2017-11-01 DIAGNOSIS — D500 Iron deficiency anemia secondary to blood loss (chronic): Secondary | ICD-10-CM

## 2017-11-01 DIAGNOSIS — D696 Thrombocytopenia, unspecified: Secondary | ICD-10-CM

## 2017-11-01 DIAGNOSIS — D693 Immune thrombocytopenic purpura: Secondary | ICD-10-CM | POA: Diagnosis not present

## 2017-11-01 DIAGNOSIS — Z79899 Other long term (current) drug therapy: Secondary | ICD-10-CM | POA: Diagnosis not present

## 2017-11-01 DIAGNOSIS — G629 Polyneuropathy, unspecified: Secondary | ICD-10-CM | POA: Diagnosis not present

## 2017-11-01 DIAGNOSIS — R5383 Other fatigue: Secondary | ICD-10-CM | POA: Diagnosis not present

## 2017-11-01 DIAGNOSIS — D72819 Decreased white blood cell count, unspecified: Secondary | ICD-10-CM | POA: Diagnosis not present

## 2017-11-01 DIAGNOSIS — D649 Anemia, unspecified: Secondary | ICD-10-CM | POA: Insufficient documentation

## 2017-11-01 DIAGNOSIS — M25562 Pain in left knee: Secondary | ICD-10-CM | POA: Diagnosis not present

## 2017-11-01 DIAGNOSIS — M25561 Pain in right knee: Secondary | ICD-10-CM | POA: Insufficient documentation

## 2017-11-01 DIAGNOSIS — K7581 Nonalcoholic steatohepatitis (NASH): Secondary | ICD-10-CM | POA: Insufficient documentation

## 2017-11-01 LAB — CBC WITH DIFFERENTIAL (CANCER CENTER ONLY)
Basophils Absolute: 0 10*3/uL (ref 0.0–0.1)
Basophils Relative: 0 %
EOS ABS: 0.1 10*3/uL (ref 0.0–0.5)
Eosinophils Relative: 3 %
HCT: 38.3 % — ABNORMAL LOW (ref 38.7–49.9)
Hemoglobin: 13.9 g/dL (ref 13.0–17.1)
LYMPHS ABS: 1.1 10*3/uL (ref 0.9–3.3)
LYMPHS PCT: 33 %
MCH: 32.9 pg (ref 28.0–33.4)
MCHC: 36.3 g/dL — AB (ref 32.0–35.9)
MCV: 90.8 fL (ref 82.0–98.0)
Monocytes Absolute: 0.3 10*3/uL (ref 0.1–0.9)
Monocytes Relative: 8 %
NEUTROS PCT: 56 %
Neutro Abs: 1.8 10*3/uL (ref 1.5–6.5)
PLATELETS: 84 10*3/uL — AB (ref 145–400)
RBC: 4.22 MIL/uL (ref 4.20–5.70)
RDW: 13.9 % (ref 11.1–15.7)
WBC Count: 3.2 10*3/uL — ABNORMAL LOW (ref 4.0–10.0)

## 2017-11-01 LAB — CMP (CANCER CENTER ONLY)
ALT: 21 U/L (ref 0–44)
ANION GAP: 7 (ref 5–15)
AST: 19 U/L (ref 15–41)
Albumin: 3.6 g/dL (ref 3.5–5.0)
Alkaline Phosphatase: 100 U/L (ref 38–126)
BUN: 15 mg/dL (ref 6–20)
CHLORIDE: 105 mmol/L (ref 98–111)
CO2: 27 mmol/L (ref 22–32)
Calcium: 10 mg/dL (ref 8.9–10.3)
Creatinine: 1.13 mg/dL (ref 0.61–1.24)
GFR, Est AFR Am: >60 mL/min (ref >60)
GFR, Estimated: >60 mL/min (ref >60)
Glucose, Bld: 137 mg/dL — ABNORMAL HIGH (ref 70–99)
Potassium: 4.2 mmol/L (ref 3.5–5.1)
SODIUM: 139 mmol/L (ref 135–145)
Total Bilirubin: 1 mg/dL (ref 0.3–1.2)
Total Protein: 6.7 g/dL (ref 6.5–8.1)

## 2017-11-01 LAB — RETICULOCYTES
RBC.: 4.2 MIL/uL (ref 4.20–5.82)
RETIC COUNT ABSOLUTE: 109.2 10*3/uL — AB (ref 34.8–93.9)
Retic Ct Pct: 2.6 % — ABNORMAL HIGH (ref 0.8–1.8)

## 2017-11-01 NOTE — Progress Notes (Signed)
Hematology and Oncology Follow Up Visit  Oscar [name] [id number] Jun 29, 1965 52 y.o. 11/01/2017   Principle Diagnosis:  Chronic immune thrombocytopenia Cirrhosis Leukopenia Anemia secondary to intermittent GI bleeding  Current Therapy:   Nplate as needed for any procedure IV iron as indicated   Interim History:  Oscar [name] is here today with his wife for follow-up. He is symptomatic with fatigue. He has not been sleeping well the past few nights due to pain in the knees.  He has had no episodes of bleeding, no bruising or petechiae.  He has arthritis in his knee caps and is seeing Oscar [name]. He is doing PT twice a week now.  He has puffiness in his ankles that comes and goes. The neuropathy in his feet has seemed a little worse but his hands are unchanged.  He had a gastric emptying study done with Oscar [name] due to n/v and abdominal pain. This showed delayed emptying. He is scheduled for an endoscopy and colonoscopy in August.  His appetite comes and goes. He admits that he needs to hydrate better at home.  He now has a blood glucose monitor and he states hi blood sugars are improving.  He is frustrated with weight gain (16 lbs in 6 months) and is going to continue to exercise once he completes PT.  No fever, chills, cough, rash, dizziness, SOB, chest pain, palpitations or changes in bladder habits.  He has sinus congestion and drainage at times due to allergies and will have occasional headaches.   ECOG Performance Status: 1 - Symptomatic but completely ambulatory  Medications:  Allergies as of 11/01/2017      Reactions   Byetta 10 Mcg Pen [exenatide] Nausea And Vomiting      Medication List        Accurate as of 11/01/17  1:55 PM. Always use your most recent med list.          acetaminophen 500 MG tablet Commonly known as:  TYLENOL Take 500-1,000 mg by mouth every 6 (six) hours as needed for moderate pain or headache.   atenolol 50 MG tablet Commonly known  as:  TENORMIN TAKE 1 TABLET DAILY   esomeprazole 40 MG capsule Commonly known as:  NEXIUM Take 1 capsule (40 mg total) by mouth 2 (two) times daily before a meal.   gabapentin 300 MG capsule Commonly known as:  NEURONTIN TAKE 2 CAPSULES DAILY AT BEDTIME FOR PERIPHERAL NEUROPATHY   hydrochlorothiazide 25 MG tablet Commonly known as:  HYDRODIURIL TAKE 1 TABLET EVERY MORNING   JANUMET 50-1000 MG tablet Generic drug:  sitaGLIPtin-metformin TAKE 1 TABLET TWICE A DAY WITH MEALS   lisinopril 20 MG tablet Commonly known as:  PRINIVIL,ZESTRIL TAKE 1 TABLET DAILY   metoCLOPramide 10 MG tablet Commonly known as:  REGLAN Take 1 tablet (10 mg total) by mouth 3 (three) times daily before meals.   naproxen sodium 220 MG tablet Commonly known as:  ALEVE Take 220 mg by mouth as needed.   NOVOLOG MIX 70/30 FLEXPEN (70-30) 100 UNIT/ML FlexPen Generic drug:  insulin aspart protamine - aspart Inject 40 Units into the skin 3 (three) times daily.   sodium chloride 0.65 % Soln nasal spray Commonly known as:  OCEAN Place 2 sprays into both nostrils daily as needed for congestion.       Allergies:  Allergies  Allergen Reactions  . Byetta 10 Mcg Pen [Exenatide] Nausea And Vomiting    Past Medical History, Surgical history, Social history, and Family History were  reviewed and updated.  Review of Systems: All other 10 point review of systems is negative.   Physical Exam:  vitals were not taken for this visit.   Wt Readings from Last 3 Encounters:  09/14/17 290 lb 6 oz (131.7 kg)  05/05/17 285 lb (129.3 kg)  03/22/17 277 lb 9.6 oz (125.9 kg)    Ocular: Sclerae unicteric, pupils equal, round and reactive to light Ear-nose-throat: Oropharynx clear, dentition fair Lymphatic: No cervical, supraclavicular or axillary adenopathy Lungs no rales or rhonchi, good excursion bilaterally Heart regular rate and rhythm, no murmur appreciated Abd soft, nontender, positive bowel sounds, no liver  or spleen tip palpated on exam, no fluid wave  MSK no focal spinal tenderness, no joint edema Neuro: non-focal, well-oriented, appropriate affect Breasts: Deferred   Lab Results  Component Value Date   WBC 3.2 (L) 11/01/2017   HGB 13.9 11/01/2017   HCT 38.3 (L) 11/01/2017   MCV 90.8 11/01/2017   PLT 84 (L) 11/01/2017   Lab Results  Component Value Date   FERRITIN 14 (L) 05/05/2017   IRON 91 05/05/2017   TIBC 498 (H) 05/05/2017   UIBC 406 05/05/2017   IRONPCTSAT 18 (L) 05/05/2017   Lab Results  Component Value Date   RETICCTPCT 2.2 (H) 05/05/2017   RBC 4.22 11/01/2017   RETICCTABS 69.5 09/20/2014   No results found for: KPAFRELGTCHN, LAMBDASER, KAPLAMBRATIO No results found for: IGGSERUM, IGA, IGMSERUM No results found for: Odetta Pink, SPEI   Chemistry      Component Value Date/Time   NA 137 09/14/2017 1646   NA 136 10/07/2016 1517   K 4.0 09/14/2017 1646   K 4.1 10/07/2016 1517   CL 104 09/14/2017 1646   CL 102 10/07/2016 1517   CO2 28 09/14/2017 1646   CO2 25 10/07/2016 1517   BUN 15 09/14/2017 1646   BUN 11 10/07/2016 1517   CREATININE 0.94 09/14/2017 1646   CREATININE 0.73 11/22/2016 1006      Component Value Date/Time   CALCIUM 9.4 09/14/2017 1646   CALCIUM 9.6 10/07/2016 1517   ALKPHOS 87 09/14/2017 1646   ALKPHOS 129 (H) 10/07/2016 1517   AST 17 09/14/2017 1646   AST 33 10/07/2016 1517   ALT 15 09/14/2017 1646   ALT 35 10/07/2016 1517   BILITOT 1.1 09/14/2017 1646   BILITOT 1.70 (H) 10/07/2016 1517      Impression and Plan: [name] is a very pleasant 52 yo gentleman with chronic NASH related ITP. His platelet count is stable at 84.  We will see what his iron studies show and bring him back in for infusion if needed.  We will plan to see him back in another 6 months for follow-up.  They will contact our office with any questions or concerns. We can certainly see him sooner if need be.    Laverna Peace, NP 7/9/20191:55 PM

## 2017-11-02 ENCOUNTER — Other Ambulatory Visit: Payer: Self-pay

## 2017-11-02 ENCOUNTER — Ambulatory Visit: Payer: BLUE CROSS/BLUE SHIELD | Attending: Orthopedic Surgery | Admitting: Physical Therapy

## 2017-11-02 ENCOUNTER — Encounter: Payer: Self-pay | Admitting: Physical Therapy

## 2017-11-02 DIAGNOSIS — M25562 Pain in left knee: Secondary | ICD-10-CM | POA: Diagnosis not present

## 2017-11-02 DIAGNOSIS — R262 Difficulty in walking, not elsewhere classified: Secondary | ICD-10-CM | POA: Diagnosis not present

## 2017-11-02 DIAGNOSIS — R29898 Other symptoms and signs involving the musculoskeletal system: Secondary | ICD-10-CM | POA: Insufficient documentation

## 2017-11-02 DIAGNOSIS — G8929 Other chronic pain: Secondary | ICD-10-CM | POA: Diagnosis not present

## 2017-11-02 DIAGNOSIS — M25561 Pain in right knee: Secondary | ICD-10-CM | POA: Diagnosis not present

## 2017-11-02 DIAGNOSIS — M25661 Stiffness of right knee, not elsewhere classified: Secondary | ICD-10-CM | POA: Insufficient documentation

## 2017-11-02 DIAGNOSIS — M25662 Stiffness of left knee, not elsewhere classified: Secondary | ICD-10-CM | POA: Diagnosis not present

## 2017-11-02 LAB — FERRITIN: Ferritin: 13 ng/mL — ABNORMAL LOW (ref 24–336)

## 2017-11-02 LAB — IRON AND TIBC
IRON: 83 ug/dL (ref 42–163)
SATURATION RATIOS: 17 % — AB (ref 42–163)
TIBC: 491 ug/dL — ABNORMAL HIGH (ref 202–409)
UIBC: 408 ug/dL

## 2017-11-02 NOTE — Therapy (Signed)
[facility] [address]  [place], [place], [address] Phone: [phone]   Fax:  [phone]  Physical Therapy Evaluation  Patient Details  Name: Oscar [name] MRN: [id number] Date of Birth: 1965-10-13 Referring Provider: Gaynelle Arabian, MD   Encounter Date: 11/02/2017  PT End of Session - 11/02/17 1801    Visit Number  1    Number of Visits  13    Date for PT Re-Evaluation  12/14/17    Authorization Type  BCBS    PT Start Time  1616    PT Stop Time  1656    PT Time Calculation (min)  40 min    Activity Tolerance  Patient tolerated treatment well    Behavior During Therapy  [facility] for tasks assessed/performed       Past Medical History:  Diagnosis Date  . Anemia   . Constipation    related to pain meds and takes Colace daily  . Diabetic gastroparesis associated with type 2 diabetes mellitus ([place]) 10/31/2017  . Esophageal varices ([place])   . Fatty liver   . Gastric varices    takes Propranlol for this  . GERD (gastroesophageal reflux disease)    takes Nexium daily  . GI bleed june 2015  . SJGGEZMO(294.7)    "weekly" (10/18/2013)  . Hematemesis 10/18/2013   hospitalized  . Hepatitis yrs ago   hx of Hep A  . History of colon polyps   . History of staph infection 2014  . Hypertension    takes Atenolol,HCTZ,and Lisinopril daily  . Insomnia    but doesn't take any meds  . Liver cirrhosis secondary to NASH ([place]) + alcohol use? contribution 02/21/2011  . Neck pain    HNP and radiculpathy  . Numbness hands and fingers   and tingling  . Obesity   . Osteoarthritis    "hands; elbows; knees" (10/18/2013)  . Pancytopenia ([place])    but never had a blood transfusion, had to take iron transfusion  sept 2014  . Sleep apnea    sleep study in epic from 2006 but doesn't use a cpap (10/18/2013)  . Thrombocytopenia ([place]) in cirrhosis and portal hypertension 06/25/2016  . Type II diabetes mellitus ([facility])    takes Novolin  and Novolog and takes Janumet daily  . Urinary frequency     Past Surgical History:  Procedure Laterality Date  . ANTERIOR CERVICAL DECOMP/DISCECTOMY FUSION N/A 05/30/2013   Procedure: Cervical five-six anterior cervical decompression with interbody prosthesis plating and bonegraft;  Surgeon: Ophelia Charter, MD;  Location: [facility];  Service: Neurosurgery;  Laterality: N/A;  Cervical five-six anterior cervical decompression with interbody prosthesis plating and bonegraft  . COLONOSCOPY WITH PROPOFOL N/A 02/07/2013   Procedure: COLONOSCOPY WITH PROPOFOL;  Surgeon: Arta Silence, MD;  Location: [facility] ENDOSCOPY;  Service: Endoscopy;  Laterality: N/A;  . COLONOSCOPY WITH PROPOFOL N/A 03/06/2014   Procedure: COLONOSCOPY WITH PROPOFOL;  Surgeon: Arta Silence, MD;  Location: [facility] ENDOSCOPY;  Service: Endoscopy;  Laterality: N/A;  . ESOPHAGOGASTRODUODENOSCOPY N/A 10/19/2013   Procedure: ESOPHAGOGASTRODUODENOSCOPY (EGD);  Surgeon: Jeryl Columbia, MD;  Location: [facility] ENDOSCOPY;  Service: Endoscopy;  Laterality: N/A;  . ESOPHAGOGASTRODUODENOSCOPY (EGD) WITH PROPOFOL N/A 02/07/2013   Procedure: ESOPHAGOGASTRODUODENOSCOPY (EGD) WITH PROPOFOL;  Surgeon: Arta Silence, MD;  Location: [facility] ENDOSCOPY;  Service: Endoscopy;  Laterality: N/A;  . ESOPHAGOGASTRODUODENOSCOPY (EGD) WITH PROPOFOL N/A 03/06/2014   Procedure: ESOPHAGOGASTRODUODENOSCOPY (EGD) WITH PROPOFOL;  Surgeon: Arta Silence, MD;  Location: [facility] ENDOSCOPY;  Service: Endoscopy;  Laterality: N/A;  . ESOPHAGOGASTRODUODENOSCOPY (EGD) WITH PROPOFOL N/A 06/25/2016   Procedure: ESOPHAGOGASTRODUODENOSCOPY (EGD) WITH PROPOFOL;  Surgeon: Gatha Mayer, MD;  Location: [facility] ENDOSCOPY;  Service: Endoscopy;  Laterality: N/A;  . EUS N/A 03/06/2014   Procedure: ESOPHAGEAL ENDOSCOPIC ULTRASOUND (EUS) RADIAL;  Surgeon: Arta Silence, MD;  Location: [facility] ENDOSCOPY;  Service: Endoscopy;  Laterality: N/A;  . INCISION AND DRAINAGE MOUTH  02/2013   " jaw area"  . WISDOM TOOTH  EXTRACTION  ~  1997    There were no vitals filed for this visit.   Subjective Assessment - 11/02/17 1618    Subjective  Patient reports he has been having B knee pain for 2 years and has been getting worse for the past 6 months. Reports L knee is worse than R. Reports "stabbing inside my knee" when climbing stairs or on an incline and "knee will give out on me if I step wrong." Pain is localized to central patella in B knees. Denies N/T or radiation.    Pertinent History  liver cirrhosis, HTN, C5-6 fusion/decompression, OA, pancytopenia, thrombocytopenia, DM II, hx staph infection 2014, liver lesion, esophageal varices, GERD, anemia, GI bleed, HA, hx hepatitis A    Limitations  Standing;Walking;Other (comment) work activities    How long can you sit comfortably?  first 10 min of sitting causes pain, then disipates    How long can you stand comfortably?  15-20 min    How long can you walk comfortably?  30 min    Diagnostic tests  Per patient- had MRI and xray on B knees that showed arthritis     Patient Stated Goals  get better so I don't have to have surgery    Currently in Pain?  No/denies    Pain Location  Knee    Pain Orientation  Right;Left    Pain Descriptors / Indicators  Sharp;Shooting    Pain Type  Chronic pain    Aggravating Factors   stairs, walking on incline, prolonged standing/walking    Pain Relieving Factors  none         [facility] PT Assessment - 11/02/17 1630      Assessment   Medical Diagnosis  Chondramalacia of B patellas    Referring Provider  Gaynelle Arabian, MD    Onset Date/Surgical Date  11/03/15    Next MD Visit  -- Not scheduled yet    Prior Therapy  Yes- for neck      Precautions   Precautions  None      Restrictions   Weight Bearing Restrictions  No      Balance Screen   Has the patient fallen in the past 6 months  No    Has the patient had a decrease in activity level because of a fear of falling?   No    Is the patient reluctant to leave their home  because of a fear of falling?   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     Leisure  Drag racing      Cognition   Overall Cognitive Status  Within [place] for tasks assessed      Observation/Other Assessments   Focus on Therapeutic Outcomes (FOTO)   Knee: 55 (45% limited, 36% predicted)      Sensation   Light Touch  Appears Intact does report neuropathy in B hands and feet      Coordination   Gross Motor Movements are Fluid and Coordinated  Yes      Posture/Postural Control   Posture/Postural Control  Postural limitations    Postural Limitations  Rounded Shoulders;Forward head      ROM / Strength   AROM / PROM / Strength  AROM;PROM;Strength      AROM   AROM Assessment Site  Knee    Right/Left Knee  Right;Left    Right Knee Extension  -3    Right Knee Flexion  120    Left Knee Extension  -3    Left Knee Flexion  125      PROM   PROM Assessment Site  Knee    Right/Left Knee  Right;Left    Right Knee Extension  0    Right Knee Flexion  125    Left Knee Extension  0    Left Knee Flexion  134      Strength   Strength Assessment Site  Hip;Knee;Ankle    Right/Left Hip  Right;Left    Right Hip Flexion  4+/5    Right Hip ABduction  4+/5    Right Hip ADduction  4/5    Left Hip Flexion  4+/5    Left Hip ABduction  4+/5    Left Hip ADduction  4/5    Right/Left Knee  Right;Left    Right Knee Flexion  4/5     Right Knee Extension  4+/5    Left Knee Flexion  4/5    Left Knee Extension  4/5    Right/Left Ankle  Right;Left    Right Ankle Dorsiflexion  4+/5    Right Ankle Plantar Flexion  4+/5    Left Ankle Dorsiflexion  4/5    Left Ankle Plantar Flexion  4/5      Flexibility   Soft Tissue Assessment /Muscle Length  yes    Hamstrings  B moderate tightness    Quadriceps  B mild tightness      Palpation   Palpation comment  B patellae with crepitus and pain with medial glide; L slightly hypermobile, R slightly hypomobile; L HS insertion TTP      Ambulation/Gait   Gait Pattern  Step-through pattern;Lateral trunk lean to right;Lateral trunk lean to left                Objective measurements completed on examination: See above findings.              PT Education - 11/02/17 1800    Education Details  prognosis, POC, HEP    Person(s) Educated  Patient    Methods  Explanation;Demonstration;Tactile cues;Verbal cues;Handout    Comprehension  Returned demonstration;Verbalized understanding       PT Short Term Goals - 11/02/17 1807      PT SHORT TERM GOAL #1  Title  Patient to be independent with initial HEP.    Time  3    Period  Weeks    Status  New    Target Date  12/14/17        PT Long Term Goals - 11/02/17 1807      PT LONG TERM GOAL #1   Title  Patient to be independent with advanced HEP.    Time  3    Period  Weeks    Status  New    Target Date  12/14/17      PT LONG TERM GOAL #2   Title  Patient to demonstrate B LE strength >=4+/5    Time  6    Period  Weeks    Status  New    Target Date  12/14/17      PT LONG TERM GOAL #3   Title  Patient to demonstrate B knee AROM WFL and pain free.    Time  6    Period  Weeks    Status  New    Target Date  12/14/17      PT LONG TERM GOAL #4   Title  Patient to demonstrate reciprocal stair climbing with 1  handrail,  good eccentric control, and no pain.    Time  6    Period  Weeks    Status  New     Target Date  12/14/17      PT LONG TERM GOAL #5   Title  Patient to report tolerance of 1 full work shift with <=2/10 pain.    Time  6    Period  Weeks    Status  New    Target Date  12/14/17             Plan - 11/02/17 1801    Clinical Impression Statement  Patient is a 52y/o M presenting to OPPT with c/o B knee pain of 2 years duration, with recent 6 month exacerbation of pain. 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 Reports prolonged standing, walking, stairs, and inclines as aggravating factors. Patient today with slight decreased strength- worse in L LE, decreased ROM, decreased HS and quad flexibility, and B crepitus with medial glide of patellae. Educated on and received HEP handout for gentle strengthening and stretching; patient reported understanding. Would benefit from skilled PT services 2x/week for 6 weeks to address aforementioned impairments.     Clinical Presentation  Stable    Clinical Decision Making  Low    Rehab Potential  Good    Clinical Impairments Affecting Rehab Potential  liver cirrhosis, HTN, C5-6 fusion/decompression, OA, pancytopenia, thrombocytopenia, DM II, hx staph infection 2014, liver lesion, esophageal varices, GERD, anemia, GI bleed, HA, hx hepatitis A    PT Frequency  2x / week    PT Duration  6 weeks    PT Treatment/Interventions  ADLs/Self Care Home Management;Cryotherapy;Electrical Stimulation;Iontophoresis 53m/ml Dexamethasone;Moist Heat;Ultrasound;Gait training;Stair training;Functional mobility training;Therapeutic activities;Therapeutic exercise;Manual techniques;Passive range of motion;Patient/family education;Neuromuscular re-education;Balance training;Dry needling;Energy conservation;Splinting;Vasopneumatic Device;Taping    PT Next Visit Plan  reassess HEP, assess stairs    Consulted and Agree with Plan of Care  Patient       Patient will benefit from skilled therapeutic intervention in  order to improve the following deficits and impairments:  Hypomobility, Decreased activity tolerance, Decreased strength, Pain, Difficulty walking, Decreased mobility, Decreased range of motion, Improper body mechanics, Postural dysfunction, Impaired flexibility,  Hypermobility  Visit Diagnosis: Chronic pain of left knee  Chronic pain of right knee  Stiffness of left knee, not elsewhere classified  Stiffness of right knee, not elsewhere classified  Difficulty in walking, not elsewhere classified  Other symptoms and signs involving the musculoskeletal system     Problem List Patient Active Problem List   Diagnosis Date Noted  . Diabetic gastroparesis associated with type 2 diabetes mellitus ([place]) 10/31/2017  . Dyspepsia 03/22/2017  . Thrombocytopenia ([place]) in cirrhosis and portal hypertension 06/25/2016  . Portal hypertensive gastropathy ([place])   . Abdominal pain, epigastric 05/10/2016  . Obesity 06/18/2015  . Metabolic syndrome 40/98/1191  . GERD (gastroesophageal reflux disease)   . Secondary esophageal varices without bleeding ([place])   . Cervical herniated disc 05/30/2013  . insulin-dependent diabetes mellitus 04/27/2013  . Habitual alcohol use 02/07/2013  . Other pancytopenia ([place]) 06/15/2012  . Anemia 05/27/2012  . Hypertension 02/21/2011  . Hyperlipidemia 02/21/2011  . Other cirrhosis of liver ([place]) 02/21/2011  . Elevated liver enzymes 02/21/2011    Janene Harvey, PT, DPT 11/02/17 6:17 PM   [facility] [address]  [place], [place], [address] Phone: [phone]   Fax:  [phone]  Name: Oscar [name] MRN: [id number] Date of Birth: Sep 05, 1965

## 2017-11-03 ENCOUNTER — Telehealth: Payer: Self-pay | Admitting: Internal Medicine

## 2017-11-03 NOTE — Telephone Encounter (Signed)
Patient's wife notified that ok to have procedure in the [facility]

## 2017-11-04 ENCOUNTER — Encounter: Payer: Self-pay | Admitting: Hematology & Oncology

## 2017-11-04 ENCOUNTER — Other Ambulatory Visit: Payer: Self-pay | Admitting: Hematology & Oncology

## 2017-11-04 DIAGNOSIS — D500 Iron deficiency anemia secondary to blood loss (chronic): Secondary | ICD-10-CM

## 2017-11-04 HISTORY — DX: Iron deficiency anemia secondary to blood loss (chronic): D50.0

## 2017-11-07 ENCOUNTER — Ambulatory Visit: Payer: BLUE CROSS/BLUE SHIELD | Admitting: Physical Therapy

## 2017-11-07 DIAGNOSIS — M25562 Pain in left knee: Principal | ICD-10-CM

## 2017-11-07 DIAGNOSIS — G8929 Other chronic pain: Secondary | ICD-10-CM | POA: Diagnosis not present

## 2017-11-07 DIAGNOSIS — M25662 Stiffness of left knee, not elsewhere classified: Secondary | ICD-10-CM

## 2017-11-07 DIAGNOSIS — R262 Difficulty in walking, not elsewhere classified: Secondary | ICD-10-CM

## 2017-11-07 DIAGNOSIS — M25661 Stiffness of right knee, not elsewhere classified: Secondary | ICD-10-CM

## 2017-11-07 DIAGNOSIS — M25561 Pain in right knee: Secondary | ICD-10-CM | POA: Diagnosis not present

## 2017-11-07 DIAGNOSIS — R29898 Other symptoms and signs involving the musculoskeletal system: Secondary | ICD-10-CM | POA: Diagnosis not present

## 2017-11-07 NOTE — Therapy (Signed)
[facility] [address]  [place], [place], [address] Phone: [phone]   Fax:  [phone]  Physical Therapy Treatment  Patient Details  Name: Oscar [name] MRN: [id number] Date of Birth: 27-Jul-1965 Referring Provider: Gaynelle Arabian, MD   Encounter Date: 11/07/2017  PT End of Session - 11/07/17 1702    Visit Number  2    Number of Visits  13    Date for PT Re-Evaluation  12/14/17    Authorization Type  BCBS    PT Start Time  1525    PT Stop Time  1608    PT Time Calculation (min)  43 min    Activity Tolerance  Patient tolerated treatment well    Behavior During Therapy  [facility] for tasks assessed/performed       Past Medical History:  Diagnosis Date  . Anemia   . Constipation    related to pain meds and takes Colace daily  . Diabetic gastroparesis associated with type 2 diabetes mellitus ([place]) 10/31/2017  . Esophageal varices ([place])   . Fatty liver   . Gastric varices    takes Propranlol for this  . GERD (gastroesophageal reflux disease)    takes Nexium daily  . GI bleed june 2015  . XBMWUXLK(440.1)    "weekly" (10/18/2013)  . Hematemesis 10/18/2013   hospitalized  . Hepatitis yrs ago   hx of Hep A  . History of colon polyps   . History of staph infection 2014  . Hypertension    takes Atenolol,HCTZ,and Lisinopril daily  . Insomnia    but doesn't take any meds  . Iron deficiency anemia due to chronic blood loss 11/04/2017  . Liver cirrhosis secondary to NASH ([place]) + alcohol use? contribution 02/21/2011  . Neck pain    HNP and radiculpathy  . Numbness hands and fingers   and tingling  . Obesity   . Osteoarthritis    "hands; elbows; knees" (10/18/2013)  . Pancytopenia ([place])    but never had a blood transfusion, had to take iron transfusion  sept 2014  . Sleep apnea    sleep study in epic from 2006 but doesn't use a cpap (10/18/2013)  . Thrombocytopenia ([place]) in cirrhosis and portal hypertension  06/25/2016  . Type II diabetes mellitus (HCC)    takes Novolin and Novolog and takes Janumet daily  . Urinary frequency     Past Surgical History:  Procedure Laterality Date  . ANTERIOR CERVICAL DECOMP/DISCECTOMY FUSION N/A 05/30/2013   Procedure: Cervical five-six anterior cervical decompression with interbody prosthesis plating and bonegraft;  Surgeon: Ophelia Charter, MD;  Location: [facility];  Service: Neurosurgery;  Laterality: N/A;  Cervical five-six anterior cervical decompression with interbody prosthesis plating and bonegraft  . COLONOSCOPY WITH PROPOFOL N/A 02/07/2013   Procedure: COLONOSCOPY WITH PROPOFOL;  Surgeon: Arta Silence, MD;  Location: [facility];  Service: Endoscopy;  Laterality: N/A;  . COLONOSCOPY WITH PROPOFOL N/A 03/06/2014   Procedure: COLONOSCOPY WITH PROPOFOL;  Surgeon: Arta Silence, MD;  Location: [facility];  Service: Endoscopy;  Laterality: N/A;  . ESOPHAGOGASTRODUODENOSCOPY N/A 10/19/2013   Procedure: ESOPHAGOGASTRODUODENOSCOPY (EGD);  Surgeon: Jeryl Columbia, MD;  Location: [facility];  Service: Endoscopy;  Laterality: N/A;  . ESOPHAGOGASTRODUODENOSCOPY (EGD) WITH PROPOFOL N/A 02/07/2013   Procedure: ESOPHAGOGASTRODUODENOSCOPY (EGD) WITH PROPOFOL;  Surgeon: Arta Silence, MD;  [facility];  Service: Endoscopy;  Laterality: N/A;  . ESOPHAGOGASTRODUODENOSCOPY (EGD) WITH PROPOFOL N/A 03/06/2014   Procedure: ESOPHAGOGASTRODUODENOSCOPY (EGD)  WITH PROPOFOL;  Surgeon: Arta Silence, MD;  [facility]: [facility];  Service: Endoscopy;  Laterality: N/A;  . ESOPHAGOGASTRODUODENOSCOPY (EGD) WITH PROPOFOL N/A 06/25/2016   Procedure: ESOPHAGOGASTRODUODENOSCOPY (EGD) WITH PROPOFOL;  Surgeon: Gatha Mayer, MD;  [facility]: [facility];  Service: Endoscopy;  Laterality: N/A;  . EUS N/A 03/06/2014   Procedure: ESOPHAGEAL ENDOSCOPIC ULTRASOUND (EUS) RADIAL;  Surgeon: Arta Silence, MD;  Location: [facility];  Service: Endoscopy;  Laterality: N/A;  .  INCISION AND DRAINAGE MOUTH  02/2013   " jaw area"  . WISDOM TOOTH EXTRACTION  ~  1997    There were no vitals filed for this visit.  Subjective Assessment - 11/07/17 1527    Subjective  Reports knees are sore- was up on them all day since 5am.    Pertinent History  liver cirrhosis, HTN, C5-6 fusion/decompression, OA, pancytopenia, thrombocytopenia, DM II, hx staph infection 2014, liver lesion, esophageal varices, GERD, anemia, GI bleed, HA, hx hepatitis A    Diagnostic tests  Per patient- had MRI and xray on B knees that showed arthritis     Patient Stated Goals  get better so I don't have to have surgery    Currently in Pain?  Yes    Pain Score  4     Pain Location  Knee    Pain Orientation  Left;Right    Pain Descriptors / Indicators  Sore    Pain Type  Chronic pain                       [facility] Adult PT Treatment/Exercise - 11/07/17 0001      Exercises   Exercises  Knee/Hip      Knee/Hip Exercises: Stretches   Passive Hamstring Stretch  Right;Left;2 reps;20 seconds;Limitations    Passive Hamstring Stretch Limitations  supine strap; edu'd patient on not using TB as strap at home    Quad Stretch  Both;2 reps;30 seconds;Limitations    Quad Stretch Limitations  prone strap      Knee/Hip Exercises: Aerobic   Nustep  L2x6 min monitoring patient for SOB and lightheadedness      Knee/Hip Exercises: Standing   Terminal Knee Extension  Strengthening;Both;1 set;10 reps;Theraband    Theraband Level (Terminal Knee Extension)  Level 4 (Blue)    Terminal Knee Extension Limitations  chair for 1 UE support    Other Standing Knee Exercises  sidestepping with red TB around toes; 2x 29ft cues to avoid lateral trunk lean      Knee/Hip Exercises: Seated   Other Seated Knee/Hip Exercises  hip IR/ER with ball and red TB; 10x each difficulty performing IR- try supine next session    Sit to Sand  1 set;10 reps;without UE support red TB around knees; cues for slow eccentric lower       Knee/Hip Exercises: Supine   Bridges with Ball Squeeze  Strengthening;Both;1 set;10 reps;Limitations VCs for foot placement    Straight Leg Raises  Strengthening;Both;1 set;10 reps;Limitations    Straight Leg Raises Limitations  VCs for slow eccentric lower      Knee/Hip Exercises: Sidelying   Hip ABduction  Strengthening;Right;Left;1 set;10 reps;Limitations    Hip ABduction Limitations  heavy VC/TCs for hips rolled forward and top LE in straight line    Hip ADduction  Strengthening;Right;Left;1 set;10 reps;Limitations    Hip ADduction Limitations  VCs for foot placement; difficulty performing on R      Knee/Hip Exercises: Prone   Other Prone Exercises  B donkey kicks;  10x each TC/VC to keep hips down               PT Short Term Goals - 11/02/17 1807      PT SHORT TERM GOAL #1   Title  Patient to be independent with initial HEP.    Time  3    Period  Weeks    Status  New    Target Date  12/14/17        PT Long Term Goals - 11/02/17 1807      PT LONG TERM GOAL #1   Title  Patient to be independent with advanced HEP.    Time  3    Period  Weeks    Status  New    Target Date  12/14/17      PT LONG TERM GOAL #2   Title  Patient to demonstrate B LE strength >=4+/5    Time  6    Period  Weeks    Status  New    Target Date  12/14/17      PT LONG TERM GOAL #3   Title  Patient to demonstrate B knee AROM WFL and pain free.    Time  6    Period  Weeks    Status  New    Target Date  12/14/17      PT LONG TERM GOAL #4   Title  Patient to demonstrate reciprocal stair climbing with 1  handrail,  good eccentric control, and no pain.    Time  6    Period  Weeks    Status  New    Target Date  12/14/17      PT LONG TERM GOAL #5   Title  Patient to report tolerance of 1 full work shift with <=2/10 pain.    Time  6    Period  Weeks    Status  New    Target Date  12/14/17            Plan - 11/07/17 1702    Clinical Impression Statement  Patient arrived to  session with report of soreness in B knees after a long work shift. Reports compliance with HEP, although reports bridges gave him back pain. Also reports doing HS stretch with TB rather than sturdy strap- advised patient to use strap, towel, or sheet to avoid injury. Patient reported understanding. Able to perform bridges without pain this session; advised patient to decrease ROM if pain occurs when performing at home. Tolerated hip strengthening ther-ex with mild c/o pain but tolerable. Able to perform sidestepping with TB resistance with VCs to correct lateral trunk shift. Ended session without increased pain but report of muscle fatigue after this session.    Clinical Impairments Affecting Rehab Potential  liver cirrhosis, HTN, C5-6 fusion/decompression, OA, pancytopenia, thrombocytopenia, DM II, hx staph infection 2014, liver lesion, esophageal varices, GERD, anemia, GI bleed, HA, hx hepatitis A    PT Treatment/Interventions  ADLs/Self Care Home Management;Cryotherapy;Electrical Stimulation;Iontophoresis 4mg /ml Dexamethasone;Moist Heat;Ultrasound;Gait training;Stair training;Functional mobility training;Therapeutic activities;Therapeutic exercise;Manual techniques;Passive range of motion;Patient/family education;Neuromuscular re-education;Balance training;Dry needling;Energy conservation;Splinting;Vasopneumatic Device;Taping    PT Next Visit Plan  assess stairs    Consulted and Agree with Plan of Care  Patient       Patient will benefit from skilled therapeutic intervention in order to improve the following deficits and impairments:  Hypomobility, Decreased activity tolerance, Decreased strength, Pain, Difficulty walking, Decreased mobility, Decreased range of motion, Improper body mechanics, Postural dysfunction, Impaired flexibility,  Hypermobility  Visit Diagnosis: Chronic pain of left knee  Chronic pain of right knee  Stiffness of left knee, not elsewhere classified  Stiffness of right knee,  not elsewhere classified  Difficulty in walking, not elsewhere classified  Other symptoms and signs involving the musculoskeletal system     Problem List Patient Active Problem List   Diagnosis Date Noted  . Iron deficiency anemia due to chronic blood loss 11/04/2017  . Diabetic gastroparesis associated with type 2 diabetes mellitus ([place]) 10/31/2017  . Dyspepsia 03/22/2017  . Thrombocytopenia ([place]) in cirrhosis and portal hypertension 06/25/2016  . Portal hypertensive gastropathy ([place])   . Abdominal pain, epigastric 05/10/2016  . Obesity 06/18/2015  . Metabolic syndrome 95/18/8416  . GERD (gastroesophageal reflux disease)   . Secondary esophageal varices without bleeding ([place])   . Cervical herniated disc 05/30/2013  . insulin-dependent diabetes mellitus 04/27/2013  . Habitual alcohol use 02/07/2013  . Other pancytopenia ([place]) 06/15/2012  . Anemia 05/27/2012  . Hypertension 02/21/2011  . Hyperlipidemia 02/21/2011  . Other cirrhosis of liver ([place]) 02/21/2011  . Elevated liver enzymes 02/21/2011    Janene Harvey, PT, DPT 11/07/17 6:08 PM   [facility] [address]  [place], [place], [address] Phone: [phone]   Fax:  [phone]  Name: KEEFER SOULLIERE MRN: [id number] Date of Birth: 1966/03/06

## 2017-11-09 ENCOUNTER — Other Ambulatory Visit: Payer: Self-pay

## 2017-11-09 ENCOUNTER — Telehealth: Payer: Self-pay

## 2017-11-09 ENCOUNTER — Inpatient Hospital Stay: Payer: BLUE CROSS/BLUE SHIELD

## 2017-11-09 VITALS — BP 121/54 | HR 66 | Temp 98.2°F | Resp 16

## 2017-11-09 DIAGNOSIS — M25562 Pain in left knee: Secondary | ICD-10-CM | POA: Diagnosis not present

## 2017-11-09 DIAGNOSIS — Z79899 Other long term (current) drug therapy: Secondary | ICD-10-CM | POA: Diagnosis not present

## 2017-11-09 DIAGNOSIS — R5383 Other fatigue: Secondary | ICD-10-CM | POA: Diagnosis not present

## 2017-11-09 DIAGNOSIS — K3189 Other diseases of stomach and duodenum: Secondary | ICD-10-CM

## 2017-11-09 DIAGNOSIS — D649 Anemia, unspecified: Secondary | ICD-10-CM | POA: Diagnosis not present

## 2017-11-09 DIAGNOSIS — K7581 Nonalcoholic steatohepatitis (NASH): Secondary | ICD-10-CM | POA: Diagnosis not present

## 2017-11-09 DIAGNOSIS — G629 Polyneuropathy, unspecified: Secondary | ICD-10-CM | POA: Diagnosis not present

## 2017-11-09 DIAGNOSIS — D72819 Decreased white blood cell count, unspecified: Secondary | ICD-10-CM | POA: Diagnosis not present

## 2017-11-09 DIAGNOSIS — D500 Iron deficiency anemia secondary to blood loss (chronic): Secondary | ICD-10-CM

## 2017-11-09 DIAGNOSIS — K766 Portal hypertension: Secondary | ICD-10-CM

## 2017-11-09 DIAGNOSIS — I8510 Secondary esophageal varices without bleeding: Secondary | ICD-10-CM

## 2017-11-09 DIAGNOSIS — D693 Immune thrombocytopenic purpura: Secondary | ICD-10-CM | POA: Diagnosis not present

## 2017-11-09 DIAGNOSIS — M25561 Pain in right knee: Secondary | ICD-10-CM | POA: Diagnosis not present

## 2017-11-09 MED ORDER — SODIUM CHLORIDE 0.9 % IV SOLN
Freq: Once | INTRAVENOUS | Status: AC
  Administered 2017-11-09: 15:00:00 via INTRAVENOUS

## 2017-11-09 MED ORDER — SODIUM CHLORIDE 0.9 % IV SOLN
510.0000 mg | Freq: Once | INTRAVENOUS | Status: AC
  Administered 2017-11-09: 510 mg via INTRAVENOUS
  Filled 2017-11-09: qty 17

## 2017-11-09 NOTE — Telephone Encounter (Signed)
Thanks for the question  Here is what I would like him to do:  1) 2 days before colonoscopy - in afternoon or early evening normal diet but take 4 dulcolax; wait 1 hour then drink followed by 4 MiraLAx doses - 17 g (1 cap or a tablespoon) in 6 oz water over 1 hour-90 mins  2) Prep day do a SuPrep prep

## 2017-11-09 NOTE — Telephone Encounter (Signed)
Dr Carlean Purl, This pt is scheduled for an endo/colon on 12/13/17. After reviewing his chart, the last colon done by Dr Paulita Fujita on 03/06/14 states the bowel prep was fair! The name of the last prep was not documented.  Since the pt has a history of constipation, does the pt need a 2 day prep or is Miralax OK?  Please advise.  Thanks, Gwyndolyn Saxon in [place].

## 2017-11-09 NOTE — Patient Instructions (Signed)

## 2017-11-09 NOTE — Telephone Encounter (Signed)
Will proceed with 2 day bowel prep per Dr Celesta Aver recommendations. Gwyndolyn Saxon

## 2017-11-10 ENCOUNTER — Ambulatory Visit: Payer: BLUE CROSS/BLUE SHIELD

## 2017-11-10 DIAGNOSIS — M25661 Stiffness of right knee, not elsewhere classified: Secondary | ICD-10-CM

## 2017-11-10 DIAGNOSIS — R262 Difficulty in walking, not elsewhere classified: Secondary | ICD-10-CM | POA: Diagnosis not present

## 2017-11-10 DIAGNOSIS — M25561 Pain in right knee: Secondary | ICD-10-CM | POA: Diagnosis not present

## 2017-11-10 DIAGNOSIS — M25662 Stiffness of left knee, not elsewhere classified: Secondary | ICD-10-CM

## 2017-11-10 DIAGNOSIS — R29898 Other symptoms and signs involving the musculoskeletal system: Secondary | ICD-10-CM | POA: Diagnosis not present

## 2017-11-10 DIAGNOSIS — G8929 Other chronic pain: Secondary | ICD-10-CM | POA: Diagnosis not present

## 2017-11-10 DIAGNOSIS — M25562 Pain in left knee: Secondary | ICD-10-CM | POA: Diagnosis not present

## 2017-11-10 NOTE — Therapy (Signed)
[facility] [address]  [place], [place], [address] Phone: [phone]   Fax:  [phone]  Physical Therapy Treatment  Patient Details  Name: Oscar [name] MRN: [id number] Date of Birth: 05/17/1965 Referring Provider: Gaynelle Arabian, MD   Encounter Date: 11/10/2017  PT End of Session - 11/10/17 1626    Visit Number  3    Number of Visits  13    Date for PT Re-Evaluation  12/14/17    Authorization Type  BCBS    PT Start Time  1621    PT Stop Time  1659    PT Time Calculation (min)  38 min    Activity Tolerance  Patient tolerated treatment well    Behavior During Therapy  [facility] for tasks assessed/performed       Past Medical History:  Diagnosis Date  . Anemia   . Constipation    related to pain meds and takes Colace daily  . Diabetic gastroparesis associated with type 2 diabetes mellitus ([place]) 10/31/2017  . Esophageal varices ([place])   . Fatty liver   . Gastric varices    takes Propranlol for this  . GERD (gastroesophageal reflux disease)    takes Nexium daily  . GI bleed june 2015  . QIHKVQQV(956.3)    "weekly" (10/18/2013)  . Hematemesis 10/18/2013   hospitalized  . Hepatitis yrs ago   hx of Hep A  . History of colon polyps   . History of staph infection 2014  . Hypertension    takes Atenolol,HCTZ,and Lisinopril daily  . Insomnia    but doesn't take any meds  . Iron deficiency anemia due to chronic blood loss 11/04/2017  . Liver cirrhosis secondary to NASH ([place]) + alcohol use? contribution 02/21/2011  . Neck pain    HNP and radiculpathy  . Numbness hands and fingers   and tingling  . Obesity   . Osteoarthritis    "hands; elbows; knees" (10/18/2013)  . Pancytopenia ([place])    but never had a blood transfusion, had to take iron transfusion  sept 2014  . Sleep apnea    sleep study in epic from 2006 but doesn't use a cpap (10/18/2013)  . Thrombocytopenia ([place]) in cirrhosis and portal hypertension  06/25/2016  . Type II diabetes mellitus (HCC)    takes Novolin and Novolog and takes Janumet daily  . Urinary frequency     Past Surgical History:  Procedure Laterality Date  . ANTERIOR CERVICAL DECOMP/DISCECTOMY FUSION N/A 05/30/2013   Procedure: Cervical five-six anterior cervical decompression with interbody prosthesis plating and bonegraft;  Surgeon: Ophelia Charter, MD;  Location: [facility];  Service: Neurosurgery;  Laterality: N/A;  Cervical five-six anterior cervical decompression with interbody prosthesis plating and bonegraft  . COLONOSCOPY WITH PROPOFOL N/A 02/07/2013   Procedure: COLONOSCOPY WITH PROPOFOL;  Surgeon: Arta Silence, MD;  [facility];  Service: Endoscopy;  Laterality: N/A;  . COLONOSCOPY WITH PROPOFOL N/A 03/06/2014   Procedure: COLONOSCOPY WITH PROPOFOL;  Surgeon: Arta Silence, MD;  [facility];  Service: Endoscopy;  Laterality: N/A;  . ESOPHAGOGASTRODUODENOSCOPY N/A 10/19/2013   Procedure: ESOPHAGOGASTRODUODENOSCOPY (EGD);  Surgeon: Jeryl Columbia, MD;  [facility];  Service: Endoscopy;  Laterality: N/A;  . ESOPHAGOGASTRODUODENOSCOPY (EGD) WITH PROPOFOL N/A 02/07/2013   Procedure: ESOPHAGOGASTRODUODENOSCOPY (EGD) WITH PROPOFOL;  Surgeon: Arta Silence, MD;  [facility];  Service: Endoscopy;  Laterality: N/A;  . ESOPHAGOGASTRODUODENOSCOPY (EGD) WITH PROPOFOL N/A 03/06/2014   Procedure: ESOPHAGOGASTRODUODENOSCOPY (EGD)  WITH PROPOFOL;  Surgeon: Arta Silence, MD;  [facility];  Service: Endoscopy;  Laterality: N/A;  . ESOPHAGOGASTRODUODENOSCOPY (EGD) WITH PROPOFOL N/A 06/25/2016   Procedure: ESOPHAGOGASTRODUODENOSCOPY (EGD) WITH PROPOFOL;  Surgeon: Gatha Mayer, MD;  [facility];  Service: Endoscopy;  Laterality: N/A;  . EUS N/A 03/06/2014   Procedure: ESOPHAGEAL ENDOSCOPIC ULTRASOUND (EUS) RADIAL;  Surgeon: Arta Silence, MD;  [facility];  Service: Endoscopy;  Laterality: N/A;  .  INCISION AND DRAINAGE MOUTH  02/2013   " jaw area"  . WISDOM TOOTH EXTRACTION  ~  1997    There were no vitals filed for this visit.  Subjective Assessment - 11/10/17 1803    Subjective  Noting some increased knee pain with standing on fork lift within this past week.      Pertinent History  liver cirrhosis, HTN, C5-6 fusion/decompression, OA, pancytopenia, thrombocytopenia, DM II, hx staph infection 2014, liver lesion, esophageal varices, GERD, anemia, GI bleed, HA, hx hepatitis A    Diagnostic tests  Per patient- had MRI and xray on B knees that showed arthritis     Patient Stated Goals  get better so I don't have to have surgery    Currently in Pain?  No/denies    Pain Score  0-No pain Rises to 3/10 with stairs     Pain Location  Knee    Pain Orientation  Right;Left    Pain Descriptors / Indicators  Constant    Pain Type  Chronic pain    Pain Frequency  Intermittent    Multiple Pain Sites  No                       [facility] Adult PT Treatment/Exercise - 11/10/17 1627      Ambulation/Gait   Stairs  Yes    Stairs Assistance  6: Modified independent (Device/Increase time)    Number of Stairs  13    Height of Stairs  8    Gait Comments  Slight quad eccentric weakness visible as pt. descending and complaint of B knee pain       Knee/Hip Exercises: Stretches   ITB Stretch  Right;Left;1 rep;30 seconds    ITB Stretch Limitations  with strap     Gastroc Stretch  Right;Left;1 rep;60 seconds    Gastroc Stretch Limitations  Prostretch       Knee/Hip Exercises: Aerobic   Nustep  L2x6 min      Knee/Hip Exercises: Machines for Strengthening   Cybex Knee Extension  B LE's: 25# x 15 reps     Cybex Knee Flexion  B con/L/R ecc: 20# 2 x 15 reps; 1 set per LE      Knee/Hip Exercises: Standing   Heel Raises  Both;15 reps;3 seconds    Other Standing Knee Exercises  B Staggered stance forward back wt. shift with black TB TKE on front LE x 15 reps       Knee/Hip Exercises:  Supine   Bridges with Ball Squeeze  Both;1 set;Limitations;15 reps    Straight Leg Raise with External Rotation  Both;15 reps;Strengthening    Straight Leg Raise with External Rotation Limitations  cues required to avoid quad lag       Knee/Hip Exercises: Sidelying   Clams  B clam shell x 15 reps                PT Short Term Goals - 11/10/17 1626      PT SHORT TERM GOAL #  1   Title  Patient to be independent with initial HEP.    Time  3    Period  Weeks    Status  On-going        PT Long Term Goals - 11/10/17 1626      PT LONG TERM GOAL #1   Title  Patient to be independent with advanced HEP.    Time  3    Period  Weeks    Status  On-going      PT LONG TERM GOAL #2   Title  Patient to demonstrate B LE strength >=4+/5    Time  6    Period  Weeks    Status  On-going      PT LONG TERM GOAL #3   Title  Patient to demonstrate B knee AROM WFL and pain free.    Time  6    Period  Weeks    Status  On-going      PT LONG TERM GOAL #4   Title  Patient to demonstrate reciprocal stair climbing with 1  handrail,  good eccentric control, and no pain.    Time  6    Period  Weeks    Status  On-going      PT LONG TERM GOAL #5   Title  Patient to report tolerance of 1 full work shift with <=2/10 pain.    Time  6    Period  Weeks    Status  On-going            Plan - 11/10/17 1627    Clinical Impression Statement  Oscar [name] doing well today reporting daily adherence to HEP.  Tolerated addition of machine hamstring curl and quad extension strengthening well today.  Did not have increased knee pain with therex today however did complaint of B knee pain with descending stairs and with visible quad instability.  Will continue to progress toward goals.      Clinical Impairments Affecting Rehab Potential  liver cirrhosis, HTN, C5-6 fusion/decompression, OA, pancytopenia, thrombocytopenia, DM II, hx staph infection 2014, liver lesion, esophageal varices, GERD, anemia, GI  bleed, HA, hx hepatitis A    PT Treatment/Interventions  ADLs/Self Care Home Management;Cryotherapy;Electrical Stimulation;Iontophoresis 4mg /ml Dexamethasone;Moist Heat;Ultrasound;Gait training;Stair training;Functional mobility training;Therapeutic activities;Therapeutic exercise;Manual techniques;Passive range of motion;Patient/family education;Neuromuscular re-education;Balance training;Dry needling;Energy conservation;Splinting;Vasopneumatic Device;Taping    Consulted and Agree with Plan of Care  Patient       Patient will benefit from skilled therapeutic intervention in order to improve the following deficits and impairments:  Hypomobility, Decreased activity tolerance, Decreased strength, Pain, Difficulty walking, Decreased mobility, Decreased range of motion, Improper body mechanics, Postural dysfunction, Impaired flexibility, Hypermobility  Visit Diagnosis: Chronic pain of left knee  Chronic pain of right knee  Stiffness of left knee, not elsewhere classified  Stiffness of right knee, not elsewhere classified  Difficulty in walking, not elsewhere classified  Other symptoms and signs involving the musculoskeletal system     Problem List Patient Active Problem List   Diagnosis Date Noted  . Iron deficiency anemia due to chronic blood loss 11/04/2017  . Diabetic gastroparesis associated with type 2 diabetes mellitus ([place]) 10/31/2017  . Dyspepsia 03/22/2017  . Thrombocytopenia ([place]) in cirrhosis and portal hypertension 06/25/2016  . Portal hypertensive gastropathy ([place])   . Abdominal pain, epigastric 05/10/2016  . Obesity 06/18/2015  . Metabolic syndrome 16/01/9603  . GERD (gastroesophageal reflux disease)   . Secondary esophageal varices without bleeding ([place])   . Cervical herniated disc  05/30/2013  . insulin-dependent diabetes mellitus 04/27/2013  . Habitual alcohol use 02/07/2013  . Other pancytopenia ([place]) 06/15/2012  . Anemia 05/27/2012  . Hypertension 02/21/2011   . Hyperlipidemia 02/21/2011  . Other cirrhosis of liver ([place]) 02/21/2011  . Elevated liver enzymes 02/21/2011    Bess Harvest, PTA 11/10/17 6:04 PM   [facility] [address]  [place], [place], [address] Phone: [phone]   Fax:  [phone]  Name: Oscar [name] MRN: [id number] Date of Birth: 26-Apr-1966

## 2017-11-14 ENCOUNTER — Encounter: Payer: Self-pay | Admitting: Physical Therapy

## 2017-11-14 ENCOUNTER — Ambulatory Visit: Payer: BLUE CROSS/BLUE SHIELD | Admitting: Physical Therapy

## 2017-11-14 DIAGNOSIS — M25661 Stiffness of right knee, not elsewhere classified: Secondary | ICD-10-CM

## 2017-11-14 DIAGNOSIS — M25561 Pain in right knee: Secondary | ICD-10-CM | POA: Diagnosis not present

## 2017-11-14 DIAGNOSIS — G8929 Other chronic pain: Secondary | ICD-10-CM | POA: Diagnosis not present

## 2017-11-14 DIAGNOSIS — R262 Difficulty in walking, not elsewhere classified: Secondary | ICD-10-CM | POA: Diagnosis not present

## 2017-11-14 DIAGNOSIS — R29898 Other symptoms and signs involving the musculoskeletal system: Secondary | ICD-10-CM

## 2017-11-14 DIAGNOSIS — M25662 Stiffness of left knee, not elsewhere classified: Secondary | ICD-10-CM | POA: Diagnosis not present

## 2017-11-14 DIAGNOSIS — M25562 Pain in left knee: Principal | ICD-10-CM

## 2017-11-14 NOTE — Therapy (Signed)
[facility] [address]  [place], [place], [address] Phone: [phone]   Fax:  [phone]  Physical Therapy Treatment  Patient Details  Name: Oscar [name] MRN: [id number] Date of Birth: 13-Oct-1965 Referring Provider: Gaynelle Arabian, MD   Encounter Date: 11/14/2017  PT End of Session - 11/14/17 1833    Visit Number  4    Number of Visits  13    Date for PT Re-Evaluation  12/14/17    Authorization Type  BCBS    PT Start Time  1529    PT Stop Time  1619 ice pack    PT Time Calculation (min)  50 min    Activity Tolerance  Patient tolerated treatment well    Behavior During Therapy  [facility] for tasks assessed/performed       Past Medical History:  Diagnosis Date  . Anemia   . Constipation    related to pain meds and takes Colace daily  . Diabetic gastroparesis associated with type 2 diabetes mellitus ([place]) 10/31/2017  . Esophageal varices ([place])   . Fatty liver   . Gastric varices    takes Propranlol for this  . GERD (gastroesophageal reflux disease)    takes Nexium daily  . GI bleed june 2015  . MCNOBSJG(283.6)    "weekly" (10/18/2013)  . Hematemesis 10/18/2013   hospitalized  . Hepatitis yrs ago   hx of Hep A  . History of colon polyps   . History of staph infection 2014  . Hypertension    takes Atenolol,HCTZ,and Lisinopril daily  . Insomnia    but doesn't take any meds  . Iron deficiency anemia due to chronic blood loss 11/04/2017  . Liver cirrhosis secondary to NASH ([place]) + alcohol use? contribution 02/21/2011  . Neck pain    HNP and radiculpathy  . Numbness hands and fingers   and tingling  . Obesity   . Osteoarthritis    "hands; elbows; knees" (10/18/2013)  . Pancytopenia ([place])    but never had a blood transfusion, had to take iron transfusion  sept 2014  . Sleep apnea    sleep study in epic from 2006 but doesn't use a cpap (10/18/2013)  . Thrombocytopenia ([place]) in cirrhosis and portal  hypertension 06/25/2016  . Type II diabetes mellitus (HCC)    takes Novolin and Novolog and takes Janumet daily  . Urinary frequency     Past Surgical History:  Procedure Laterality Date  . ANTERIOR CERVICAL DECOMP/DISCECTOMY FUSION N/A 05/30/2013   Procedure: Cervical five-six anterior cervical decompression with interbody prosthesis plating and bonegraft;  Surgeon: Ophelia Charter, MD;  Location: [facility];  Service: Neurosurgery;  Laterality: N/A;  Cervical five-six anterior cervical decompression with interbody prosthesis plating and bonegraft  . COLONOSCOPY WITH PROPOFOL N/A 02/07/2013   Procedure: COLONOSCOPY WITH PROPOFOL;  Surgeon: Arta Silence, MD;  Location: [facility];  Service: Endoscopy;  Laterality: N/A;  . COLONOSCOPY WITH PROPOFOL N/A 03/06/2014   Procedure: COLONOSCOPY WITH PROPOFOL;  Surgeon: Arta Silence, MD;  Location: [facility];  Service: Endoscopy;  Laterality: N/A;  . ESOPHAGOGASTRODUODENOSCOPY N/A 10/19/2013   Procedure: ESOPHAGOGASTRODUODENOSCOPY (EGD);  Surgeon: Jeryl [place], MD;  [facility];  Service: Endoscopy;  Laterality: N/A;  . ESOPHAGOGASTRODUODENOSCOPY (EGD) WITH PROPOFOL N/A 02/07/2013   Procedure: ESOPHAGOGASTRODUODENOSCOPY (EGD) WITH PROPOFOL;  Surgeon: Arta Silence, MD;  Location: [facility];  Service: Endoscopy;  Laterality: N/A;  . ESOPHAGOGASTRODUODENOSCOPY (EGD) WITH PROPOFOL N/A 03/06/2014   Procedure:  ESOPHAGOGASTRODUODENOSCOPY (EGD) WITH PROPOFOL;  Surgeon: Arta Silence, MD;  Location: [facility];  Service: Endoscopy;  Laterality: N/A;  . ESOPHAGOGASTRODUODENOSCOPY (EGD) WITH PROPOFOL N/A 06/25/2016   Procedure: ESOPHAGOGASTRODUODENOSCOPY (EGD) WITH PROPOFOL;  Surgeon: Gatha Mayer, MD;  Location: [facility];  Service: Endoscopy;  Laterality: N/A;  . EUS N/A 03/06/2014   Procedure: ESOPHAGEAL ENDOSCOPIC ULTRASOUND (EUS) RADIAL;  Surgeon: Arta Silence, MD;  Location: [facility];  Service: Endoscopy;  Laterality:  N/A;  . INCISION AND DRAINAGE MOUTH  02/2013   " jaw area"  . WISDOM TOOTH EXTRACTION  ~  1997    There were no vitals filed for this visit.  Subjective Assessment - 11/14/17 1530    Subjective  Not much is new. Twisted his R knee by stepping the wrong way today. Hurts when he puts pressure on it,     Pertinent History  liver cirrhosis, HTN, C5-6 fusion/decompression, OA, pancytopenia, thrombocytopenia, DM II, hx staph infection 2014, liver lesion, esophageal varices, GERD, anemia, GI bleed, HA, hx hepatitis A    Diagnostic tests  Per patient- had MRI and xray on B knees that showed arthritis     Patient Stated Goals  get better so I don't have to have surgery    Currently in Pain?  Yes    Pain Score  6     Pain Location  Knee    Pain Orientation  Right    Pain Descriptors / Indicators  Sharp    Pain Type  Acute pain         [facility] PT Assessment - 11/14/17 0001      Palpation   Palpation comment  pain on R medial and lateral sided of patella; mild prepitus with palpation of medial R patella       Special Tests    Special Tests  Knee Special Tests    Other special tests  negative R Lachman's, valgus stress, varus stress                   [facility] Adult PT Treatment/Exercise - 11/14/17 0001      Knee/Hip Exercises: Stretches   Passive Hamstring Stretch  Right;Left;Limitations;1 rep;30 seconds    Passive Hamstring Stretch Limitations  supine strap    Quad Stretch  Both;30 seconds;Limitations;1 rep    Sports administrator Limitations  prone strap      Knee/Hip Exercises: Aerobic   Recumbent Bike  L1 x 7min      Knee/Hip Exercises: Standing   Terminal Knee Extension  Strengthening;Both;1 set;Theraband;15 reps    Theraband Level (Terminal Knee Extension)  Level 4 (Blue)    Terminal Knee Extension Limitations  chair for 1 UE support; VCs to avoid locking out knees    Other Standing Knee Exercises  sidestepping with red TB around toes; 2x 53ft      Knee/Hip Exercises: Seated    Other Seated Knee/Hip Exercises  hip IR/ER with ball and red TB; 15x each      Knee/Hip Exercises: Supine   Other Supine Knee/Hip Exercises  bridge + march 10x heavy VC/TCs for form      Knee/Hip Exercises: Sidelying   Hip ABduction  Strengthening;Right;Left;1 set;10 reps;Limitations    Hip ABduction Limitations  report of "knee cap moving" which improved after cues for full extension    Hip ADduction  Strengthening;Right;Left;1 set;10 reps;Limitations    Hip ADduction Limitations  heavy VCs for form    Clams  B clam shell w/ red TB around knees x 15  reps  VC/TCs for form      Modalities   Modalities  Cryotherapy      Cryotherapy   Number Minutes Cryotherapy  10 Minutes    Cryotherapy Location  Knee R    Type of Cryotherapy  Ice pack               PT Short Term Goals - 11/10/17 1626      PT SHORT TERM GOAL #1   Title  Patient to be independent with initial HEP.    Time  3    Period  Weeks    Status  On-going        PT Long Term Goals - 11/10/17 1626      PT LONG TERM GOAL #1   Title  Patient to be independent with advanced HEP.    Time  3    Period  Weeks    Status  On-going      PT LONG TERM GOAL #2   Title  Patient to demonstrate B LE strength >=4+/5    Time  6    Period  Weeks    Status  On-going      PT LONG TERM GOAL #3   Title  Patient to demonstrate B knee AROM WFL and pain free.    Time  6    Period  Weeks    Status  On-going      PT LONG TERM GOAL #4   Title  Patient to demonstrate reciprocal stair climbing with 1  handrail,  good eccentric control, and no pain.    Time  6    Period  Weeks    Status  On-going      PT LONG TERM GOAL #5   Title  Patient to report tolerance of 1 full work shift with <=2/10 pain.    Time  6    Period  Weeks    Status  On-going            Plan - 11/14/17 1834    Clinical Impression Statement  Patient arrived to session with report of increased R knee pain after twisting his knee the wrong way.  Reports pain "underneath the middle of my kneecap." Medial and lateral aspects of R patella TTP and with mild crepitus felt with palpation. Ligamentous stress tests negative. Progressed ther-ex with bridge with marching this date. Patient required heavy VC/TCs to correct form and avoid lowering hips. Good tolerance of added banded resistance to clamshells this date.  Patient without pain with hip strengthening ther-ex this date. Advised patient to ice R knee and monitor symptoms, and contact MD if symptoms worsen. Patient agreeable. Received ice pack to R knee at end of session. Normal integumentary response noted.     Clinical Impairments Affecting Rehab Potential  liver cirrhosis, HTN, C5-6 fusion/decompression, OA, pancytopenia, thrombocytopenia, DM II, hx staph infection 2014, liver lesion, esophageal varices, GERD, anemia, GI bleed, HA, hx hepatitis A    PT Treatment/Interventions  ADLs/Self Care Home Management;Cryotherapy;Electrical Stimulation;Iontophoresis 4mg /ml Dexamethasone;Moist Heat;Ultrasound;Gait training;Stair training;Functional mobility training;Therapeutic activities;Therapeutic exercise;Manual techniques;Passive range of motion;Patient/family education;Neuromuscular re-education;Balance training;Dry needling;Energy conservation;Splinting;Vasopneumatic Device;Taping    PT Next Visit Plan  reassess R knee pain    Consulted and Agree with Plan of Care  Patient       Patient will benefit from skilled therapeutic intervention in order to improve the following deficits and impairments:  Hypomobility, Decreased activity tolerance, Decreased strength, Pain, Difficulty walking, Decreased mobility, Decreased range of motion,  Improper body mechanics, Postural dysfunction, Impaired flexibility, Hypermobility  Visit Diagnosis: Chronic pain of left knee  Chronic pain of right knee  Stiffness of left knee, not elsewhere classified  Stiffness of right knee, not elsewhere  classified  Difficulty in walking, not elsewhere classified  Other symptoms and signs involving the musculoskeletal system     Problem List Patient Active Problem List   Diagnosis Date Noted  . Iron deficiency anemia due to chronic blood loss 11/04/2017  . Diabetic gastroparesis associated with type 2 diabetes mellitus ([place]) 10/31/2017  . Dyspepsia 03/22/2017  . Thrombocytopenia ([place]) in cirrhosis and portal hypertension 06/25/2016  . Portal hypertensive gastropathy ([place])   . Abdominal pain, epigastric 05/10/2016  . Obesity 06/18/2015  . Metabolic syndrome 67/59/1638  . GERD (gastroesophageal reflux disease)   . Secondary esophageal varices without bleeding ([place])   . Cervical herniated disc 05/30/2013  . insulin-dependent diabetes mellitus 04/27/2013  . Habitual alcohol use 02/07/2013  . Other pancytopenia ([place]) 06/15/2012  . Anemia 05/27/2012  . Hypertension 02/21/2011  . Hyperlipidemia 02/21/2011  . Other cirrhosis of liver ([place]) 02/21/2011  . Elevated liver enzymes 02/21/2011   Janene Harvey, PT, DPT 11/14/17 6:37 PM   [facility] [address]  [place], [place], [address] Phone: [phone]   Fax:  [phone]  Name: JAMEEK BRUNTZ MRN: [id number] Date of Birth: 1965/08/04

## 2017-11-15 ENCOUNTER — Ambulatory Visit: Payer: BLUE CROSS/BLUE SHIELD | Admitting: Physical Therapy

## 2017-11-15 ENCOUNTER — Other Ambulatory Visit: Payer: Self-pay | Admitting: Internal Medicine

## 2017-11-17 ENCOUNTER — Ambulatory Visit: Payer: BLUE CROSS/BLUE SHIELD

## 2017-11-17 DIAGNOSIS — M25661 Stiffness of right knee, not elsewhere classified: Secondary | ICD-10-CM

## 2017-11-17 DIAGNOSIS — G8929 Other chronic pain: Secondary | ICD-10-CM | POA: Diagnosis not present

## 2017-11-17 DIAGNOSIS — M25562 Pain in left knee: Principal | ICD-10-CM

## 2017-11-17 DIAGNOSIS — R262 Difficulty in walking, not elsewhere classified: Secondary | ICD-10-CM | POA: Diagnosis not present

## 2017-11-17 DIAGNOSIS — M25561 Pain in right knee: Secondary | ICD-10-CM

## 2017-11-17 DIAGNOSIS — R29898 Other symptoms and signs involving the musculoskeletal system: Secondary | ICD-10-CM

## 2017-11-17 DIAGNOSIS — M25662 Stiffness of left knee, not elsewhere classified: Secondary | ICD-10-CM

## 2017-11-17 NOTE — Therapy (Signed)
[facility] [address]  [place], [place], [address] Phone: [phone]   Fax:  [phone]  Physical Therapy Treatment  Patient Details  Name: Oscar [name] MRN: [id number] Date of Birth: 1966-03-01 Referring Provider: Gaynelle Arabian, MD   Encounter Date: 11/17/2017  PT End of Session - 11/17/17 1619    Visit Number  5    Number of Visits  13    Date for PT Re-Evaluation  12/14/17    Authorization Type  BCBS    PT Start Time  2878    PT Stop Time  1655    PT Time Calculation (min)  38 min    Activity Tolerance  Patient tolerated treatment well    Behavior During Therapy  [facility] for tasks assessed/performed       Past Medical History:  Diagnosis Date  . Anemia   . Constipation    related to pain meds and takes Colace daily  . Diabetic gastroparesis associated with type 2 diabetes mellitus ([place]) 10/31/2017  . Esophageal varices ([place])   . Fatty liver   . Gastric varices    takes Propranlol for this  . GERD (gastroesophageal reflux disease)    takes Nexium daily  . GI bleed june 2015  . MVEHMCNO(709.6)    "weekly" (10/18/2013)  . Hematemesis 10/18/2013   hospitalized  . Hepatitis yrs ago   hx of Hep A  . History of colon polyps   . History of staph infection 2014  . Hypertension    takes Atenolol,HCTZ,and Lisinopril daily  . Insomnia    but doesn't take any meds  . Iron deficiency anemia due to chronic blood loss 11/04/2017  . Liver cirrhosis secondary to NASH ([place]) + alcohol use? contribution 02/21/2011  . Neck pain    HNP and radiculpathy  . Numbness hands and fingers   and tingling  . Obesity   . Osteoarthritis    "hands; elbows; knees" (10/18/2013)  . Pancytopenia ([place])    but never had a blood transfusion, had to take iron transfusion  sept 2014  . Sleep apnea    sleep study in epic from 2006 but doesn't use a cpap (10/18/2013)  . Thrombocytopenia ([place]) in cirrhosis and portal hypertension  06/25/2016  . Type II diabetes mellitus (H[facility])    takes Novolin and Novolog and takes Janumet daily  . Urinary frequency     Past Surgical History:  Procedure Laterality Date  . ANTERIOR CERVICAL DECOMP/DISCECTOMY FUSION N/A 05/30/2013   Procedure: Cervical five-six anterior cervical decompression with interbody prosthesis plating and bonegraft;  Surgeon: Ophelia Charter, MD;  Location: [facility];  Service: Neurosurgery;  Laterality: N/A;  Cervical five-six anterior cervical decompression with interbody prosthesis plating and bonegraft  . COLONOSCOPY WITH PROPOFOL N/A 02/07/2013   Procedure: COLONOSCOPY WITH PROPOFOL;  Surgeon: Arta Silence, MD;  Location: [facility] ENDOSCOPY;  Service: Endoscopy;  Laterality: N/A;  . COLONOSCOPY WITH PROPOFOL N/A 03/06/2014   Procedure: COLONOSCOPY WITH PROPOFOL;  Surgeon: Arta Silence, MD;  Location: [facility];  Service: Endoscopy;  Laterality: N/A;  . ESOPHAGOGASTRODUODENOSCOPY N/A 10/19/2013   Procedure: ESOPHAGOGASTRODUODENOSCOPY (EGD);  Surgeon: Jeryl [place], MD;  Location: [facility] ENDOSCOPY;  Service: Endoscopy;  Laterality: N/A;  . ESOPHAGOGASTRODUODENOSCOPY (EGD) WITH PROPOFOL N/A 02/07/2013   Procedure: ESOPHAGOGASTRODUODENOSCOPY (EGD) WITH PROPOFOL;  Surgeon: Arta Silence, MD;  Location: [facility];  Service: Endoscopy;  Laterality: N/A;  . ESOPHAGOGASTRODUODENOSCOPY (EGD) WITH PROPOFOL N/A 03/06/2014   Procedure: ESOPHAGOGASTRODUODENOSCOPY (EGD)  WITH PROPOFOL;  Surgeon: Arta Silence, MD;  Location: [facility];  Service: Endoscopy;  Laterality: N/A;  . ESOPHAGOGASTRODUODENOSCOPY (EGD) WITH PROPOFOL N/A 06/25/2016   Procedure: ESOPHAGOGASTRODUODENOSCOPY (EGD) WITH PROPOFOL;  Surgeon: Gatha Mayer, MD;  Location: [facility];  Service: Endoscopy;  Laterality: N/A;  . EUS N/A 03/06/2014   Procedure: ESOPHAGEAL ENDOSCOPIC ULTRASOUND (EUS) RADIAL;  Surgeon: Arta Silence, MD;  Location: [facility];  Service: Endoscopy;  Laterality: N/A;  .  INCISION AND DRAINAGE MOUTH  02/2013   " jaw area"  . WISDOM TOOTH EXTRACTION  ~  1997    There were no vitals filed for this visit.  Subjective Assessment - 11/17/17 1621    Subjective  Feels R knee pain is improving following twisting injury on Monday at work.      Pertinent History  liver cirrhosis, HTN, C5-6 fusion/decompression, OA, pancytopenia, thrombocytopenia, DM II, hx staph infection 2014, liver lesion, esophageal varices, GERD, anemia, GI bleed, HA, hx hepatitis A    Diagnostic tests  Per patient- had MRI and xray on B knees that showed arthritis     Patient Stated Goals  get better so I don't have to have surgery    Currently in Pain?  Yes    Pain Score  4     Pain Location  Knee    Pain Orientation  Right    Pain Descriptors / Indicators  Dull    Pain Type  Acute pain    Pain Onset  More than a month ago    Pain Frequency  Intermittent    Aggravating Factors   Stairs     Multiple Pain Sites  No                       [facility] Adult PT Treatment/Exercise - 11/17/17 1628      Knee/Hip Exercises: Aerobic   Nustep  L4x7 min      Knee/Hip Exercises: Machines for Strengthening   Cybex Knee Extension  B LE's: 35# x 15 reps     Cybex Knee Flexion  B con/L/R ecc: 25# 2 x 15 reps; 1 set per LE      Knee/Hip Exercises: Standing   Forward Step Up  Right;Left;10 reps;Step Height: 8";Hand Hold: 1 6" step with R step up due to pain on 8"     Forward Step Up Limitations  1 ski pole    Wall Squat  15 reps;5 seconds Mini to avoid R knee pain     Wall Squat Limitations  leaning on green p-ball       Knee/Hip Exercises: Seated   Long Arc Quad  Right;Left;15 reps;Strengthening    Long Arc Quad Weight  1 lbs.    Long Arc Quad Limitations  adduction ball squeeze       Knee/Hip Exercises: Supine   Straight Leg Raise with External Rotation  Both;15 reps;Strengthening    Straight Leg Raise with External Rotation Limitations  1#; cues required to avoid quad lag        Knee/Hip Exercises: Sidelying   Hip ADduction  Right;Left;Limitations;1 set x 12 reps                PT Short Term Goals - 11/10/17 1626      PT SHORT TERM GOAL #1   Title  Patient to be independent with initial HEP.    Time  3    Period  Weeks    Status  On-going  PT Long Term Goals - 11/10/17 1626      PT LONG TERM GOAL #1   Title  Patient to be independent with advanced HEP.    Time  3    Period  Weeks    Status  On-going      PT LONG TERM GOAL #2   Title  Patient to demonstrate B LE strength >=4+/5    Time  6    Period  Weeks    Status  On-going      PT LONG TERM GOAL #3   Title  Patient to demonstrate B knee AROM WFL and pain free.    Time  6    Period  Weeks    Status  On-going      PT LONG TERM GOAL #4   Title  Patient to demonstrate reciprocal stair climbing with 1  handrail,  good eccentric control, and no pain.    Time  6    Period  Weeks    Status  On-going      PT LONG TERM GOAL #5   Title  Patient to report tolerance of 1 full work shift with <=2/10 pain.    Time  6    Period  Weeks    Status  On-going            Plan - 11/17/17 1620    Clinical Impression Statement  Pt. reporting he feels R knee pain is improving from twisting injury at work on Monday.  Tolerated addition of forward step-up, and progression of machine strengthening activities well today.  Therex focused on quad/VMO strengthening for hopeful improvement in patellar tracking and reduction in knee pain with functional tasks.  Will continue to progress toward goals.      Clinical Impairments Affecting Rehab Potential  liver cirrhosis, HTN, C5-6 fusion/decompression, OA, pancytopenia, thrombocytopenia, DM II, hx staph infection 2014, liver lesion, esophageal varices, GERD, anemia, GI bleed, HA, hx hepatitis A    PT Treatment/Interventions  ADLs/Self Care Home Management;Cryotherapy;Electrical Stimulation;Iontophoresis 4mg /ml Dexamethasone;Moist Heat;Ultrasound;Gait  training;Stair training;Functional mobility training;Therapeutic activities;Therapeutic exercise;Manual techniques;Passive range of motion;Patient/family education;Neuromuscular re-education;Balance training;Dry needling;Energy conservation;Splinting;Vasopneumatic Device;Taping    Consulted and Agree with Plan of Care  Patient       Patient will benefit from skilled therapeutic intervention in order to improve the following deficits and impairments:  Hypomobility, Decreased activity tolerance, Decreased strength, Pain, Difficulty walking, Decreased mobility, Decreased range of motion, Improper body mechanics, Postural dysfunction, Impaired flexibility, Hypermobility  Visit Diagnosis: Chronic pain of left knee  Chronic pain of right knee  Stiffness of left knee, not elsewhere classified  Stiffness of right knee, not elsewhere classified  Difficulty in walking, not elsewhere classified  Other symptoms and signs involving the musculoskeletal system     Problem List Patient Active Problem List   Diagnosis Date Noted  . Iron deficiency anemia due to chronic blood loss 11/04/2017  . Diabetic gastroparesis associated with type 2 diabetes mellitus ([place]) 10/31/2017  . Dyspepsia 03/22/2017  . Thrombocytopenia ([place]) in cirrhosis and portal hypertension 06/25/2016  . Portal hypertensive gastropathy ([place])   . Abdominal pain, epigastric 05/10/2016  . Obesity 06/18/2015  . Metabolic syndrome 21/30/8657  . GERD (gastroesophageal reflux disease)   . Secondary esophageal varices without bleeding ([place])   . Cervical herniated disc 05/30/2013  . insulin-dependent diabetes mellitus 04/27/2013  . Habitual alcohol use 02/07/2013  . Other pancytopenia ([place]) 06/15/2012  . Anemia 05/27/2012  . Hypertension 02/21/2011  . Hyperlipidemia 02/21/2011  . Other  cirrhosis of liver ([place]) 02/21/2011  . Elevated liver enzymes 02/21/2011    Bess Harvest, PTA 11/17/17 5:51 PM    [facility] [address]  [place], [place], [address] Phone: [phone]   Fax:  [phone]  Name: Oscar [name] MRN: [id number] Date of Birth: 09-Feb-1966

## 2017-11-21 ENCOUNTER — Ambulatory Visit (AMBULATORY_SURGERY_CENTER): Payer: Self-pay

## 2017-11-21 VITALS — Ht 68.0 in | Wt 290.8 lb

## 2017-11-21 DIAGNOSIS — Z86010 Personal history of colonic polyps: Secondary | ICD-10-CM

## 2017-11-21 MED ORDER — NA SULFATE-K SULFATE-MG SULF 17.5-3.13-1.6 GM/177ML PO SOLN: 1.0000 | Freq: Once | ORAL | 0 refills | Status: AC

## 2017-11-21 NOTE — Progress Notes (Signed)
Denies allergies to eggs or soy products. Denies complication of anesthesia or sedation. Denies use of weight loss medication. Denies use of O2.   Emmi instructions declined.  

## 2017-11-22 ENCOUNTER — Other Ambulatory Visit: Payer: Self-pay | Admitting: Internal Medicine

## 2017-11-22 DIAGNOSIS — K7460 Unspecified cirrhosis of liver: Secondary | ICD-10-CM

## 2017-11-22 DIAGNOSIS — Z86010 Personal history of colonic polyps: Secondary | ICD-10-CM

## 2017-11-22 DIAGNOSIS — D696 Thrombocytopenia, unspecified: Secondary | ICD-10-CM

## 2017-11-22 DIAGNOSIS — I10 Essential (primary) hypertension: Secondary | ICD-10-CM

## 2017-11-22 DIAGNOSIS — E119 Type 2 diabetes mellitus without complications: Secondary | ICD-10-CM

## 2017-11-22 DIAGNOSIS — I8510 Secondary esophageal varices without bleeding: Secondary | ICD-10-CM

## 2017-11-22 DIAGNOSIS — E1142 Type 2 diabetes mellitus with diabetic polyneuropathy: Secondary | ICD-10-CM

## 2017-11-22 DIAGNOSIS — Z125 Encounter for screening for malignant neoplasm of prostate: Secondary | ICD-10-CM

## 2017-11-22 DIAGNOSIS — E8881 Metabolic syndrome: Secondary | ICD-10-CM

## 2017-11-22 DIAGNOSIS — E559 Vitamin D deficiency, unspecified: Secondary | ICD-10-CM

## 2017-11-22 DIAGNOSIS — Z0000 Encounter for general adult medical examination without abnormal findings: Secondary | ICD-10-CM

## 2017-11-22 DIAGNOSIS — K7030 Alcoholic cirrhosis of liver without ascites: Secondary | ICD-10-CM

## 2017-11-22 DIAGNOSIS — E7849 Other hyperlipidemia: Secondary | ICD-10-CM

## 2017-11-22 DIAGNOSIS — K219 Gastro-esophageal reflux disease without esophagitis: Secondary | ICD-10-CM

## 2017-11-22 DIAGNOSIS — D708 Other neutropenia: Secondary | ICD-10-CM

## 2017-11-24 ENCOUNTER — Other Ambulatory Visit: Payer: BLUE CROSS/BLUE SHIELD | Admitting: Internal Medicine

## 2017-11-24 ENCOUNTER — Encounter: Payer: Self-pay | Admitting: Internal Medicine

## 2017-11-24 ENCOUNTER — Ambulatory Visit: Payer: BLUE CROSS/BLUE SHIELD

## 2017-11-25 ENCOUNTER — Other Ambulatory Visit: Payer: Self-pay | Admitting: Internal Medicine

## 2017-11-25 DIAGNOSIS — E119 Type 2 diabetes mellitus without complications: Secondary | ICD-10-CM

## 2017-11-25 DIAGNOSIS — R17 Unspecified jaundice: Secondary | ICD-10-CM

## 2017-11-25 DIAGNOSIS — IMO0001 Reserved for inherently not codable concepts without codable children: Secondary | ICD-10-CM

## 2017-11-25 DIAGNOSIS — Z125 Encounter for screening for malignant neoplasm of prostate: Secondary | ICD-10-CM

## 2017-11-25 DIAGNOSIS — Z13220 Encounter for screening for lipoid disorders: Secondary | ICD-10-CM

## 2017-11-25 DIAGNOSIS — Z794 Long term (current) use of insulin: Secondary | ICD-10-CM

## 2017-11-28 ENCOUNTER — Ambulatory Visit: Payer: BLUE CROSS/BLUE SHIELD | Attending: Orthopedic Surgery | Admitting: Physical Therapy

## 2017-11-28 ENCOUNTER — Encounter: Payer: Self-pay | Admitting: Physical Therapy

## 2017-11-28 DIAGNOSIS — M25662 Stiffness of left knee, not elsewhere classified: Secondary | ICD-10-CM | POA: Insufficient documentation

## 2017-11-28 DIAGNOSIS — M25561 Pain in right knee: Secondary | ICD-10-CM | POA: Insufficient documentation

## 2017-11-28 DIAGNOSIS — R262 Difficulty in walking, not elsewhere classified: Secondary | ICD-10-CM | POA: Diagnosis not present

## 2017-11-28 DIAGNOSIS — R29898 Other symptoms and signs involving the musculoskeletal system: Secondary | ICD-10-CM | POA: Insufficient documentation

## 2017-11-28 DIAGNOSIS — M25661 Stiffness of right knee, not elsewhere classified: Secondary | ICD-10-CM | POA: Insufficient documentation

## 2017-11-28 DIAGNOSIS — M25562 Pain in left knee: Secondary | ICD-10-CM | POA: Insufficient documentation

## 2017-11-28 DIAGNOSIS — G8929 Other chronic pain: Secondary | ICD-10-CM | POA: Insufficient documentation

## 2017-11-28 NOTE — Therapy (Addendum)
[facility] [address]  [place], [place], [address] Phone: [phone]   Fax:  [phone]  Physical Therapy Treatment  Patient Details  Name: Oscar [name] MRN: [id number] Date of Birth: 1965/07/11 Referring Provider: Gaynelle Arabian, MD   Encounter Date: 11/28/2017  PT End of Session - 11/28/17 1815    Visit Number  6    Number of Visits  13    Date for PT Re-Evaluation  12/14/17    Authorization Type  BCBS    PT Start Time  1520    PT Stop Time  1608    PT Time Calculation (min)  48 min    Activity Tolerance  Patient tolerated treatment well    Behavior During Therapy  [facility] for tasks assessed/performed       Past Medical History:  Diagnosis Date  . Allergy   . Anemia   . Clotting disorder ([place])   . Constipation    related to pain meds and takes Colace daily  . Diabetic gastroparesis associated with type 2 diabetes mellitus ([place]) 10/31/2017  . Esophageal varices ([place])   . Fatty liver   . Gastric varices    takes Propranlol for this  . GERD (gastroesophageal reflux disease)    takes Nexium daily  . GI bleed june 2015  . RPRXYVOP(929.2)    "weekly" (10/18/2013)  . Hematemesis 10/18/2013   hospitalized  . Hepatitis yrs ago   hx of Hep A  . History of colon polyps   . History of staph infection 2014  . Hypertension    takes Atenolol,HCTZ,and Lisinopril daily  . Insomnia    but doesn't take any meds  . Iron deficiency anemia due to chronic blood loss 11/04/2017  . Liver cirrhosis secondary to NASH ([place]) + alcohol use? contribution 02/21/2011  . Neck pain    HNP and radiculpathy  . Neuromuscular disorder ([place])   . Numbness hands and fingers   and tingling  . Obesity   . Osteoarthritis    "hands; elbows; knees" (10/18/2013)  . Pancytopenia ([place])    but never had a blood transfusion, had to take iron transfusion  sept 2014  . Sleep apnea    sleep study in epic from 2006 but doesn't use a cpap  (10/18/2013)  . Thrombocytopenia ([place]) in cirrhosis and portal hypertension 06/25/2016  . Type II diabetes mellitus ([facility])    takes Novolin and Novolog and takes Janumet daily  . Urinary frequency     Past Surgical History:  Procedure Laterality Date  . ANTERIOR CERVICAL DECOMP/DISCECTOMY FUSION N/A 05/30/2013   Procedure: Cervical five-six anterior cervical decompression with interbody prosthesis plating and bonegraft;  Surgeon: Ophelia Charter, MD;  Location: [facility];  Service: Neurosurgery;  Laterality: N/A;  Cervical five-six anterior cervical decompression with interbody prosthesis plating and bonegraft  . COLONOSCOPY WITH PROPOFOL N/A 02/07/2013   Procedure: COLONOSCOPY WITH PROPOFOL;  Surgeon: Arta Silence, MD;  Location: [facility];  Service: Endoscopy;  Laterality: N/A;  . COLONOSCOPY WITH PROPOFOL N/A 03/06/2014   Procedure: COLONOSCOPY WITH PROPOFOL;  Surgeon: Arta Silence, MD;  Location: [facility];  Service: Endoscopy;  Laterality: N/A;  . ESOPHAGOGASTRODUODENOSCOPY N/A 10/19/2013   Procedure: ESOPHAGOGASTRODUODENOSCOPY (EGD);  Surgeon: Jeryl Columbia, MD;  Location: [facility]OSCOPY;  Service: Endoscopy;  Laterality: N/A;  . ESOPHAGOGASTRODUODENOSCOPY (EGD) WITH PROPOFOL N/A 02/07/2013   Procedure: ESOPHAGOGASTRODUODENOSCOPY (EGD) WITH PROPOFOL;  Surgeon: Arta Silence, MD;  Location: [facility];  Service: Endoscopy;  Laterality: N/A;  . ESOPHAGOGASTRODUODENOSCOPY (EGD) WITH PROPOFOL N/A 03/06/2014   Procedure: ESOPHAGOGASTRODUODENOSCOPY (EGD) WITH PROPOFOL;  Surgeon: Arta Silence, MD;  Location: [facility];  Service: Endoscopy;  Laterality: N/A;  . ESOPHAGOGASTRODUODENOSCOPY (EGD) WITH PROPOFOL N/A 06/25/2016   Procedure: ESOPHAGOGASTRODUODENOSCOPY (EGD) WITH PROPOFOL;  Surgeon: Gatha Mayer, MD;  Location: [facility];  Service: Endoscopy;  Laterality: N/A;  . EUS N/A 03/06/2014   Procedure: ESOPHAGEAL ENDOSCOPIC ULTRASOUND (EUS) RADIAL;  Surgeon: Arta Silence, MD;   Location: [facility];  Service: Endoscopy;  Laterality: N/A;  . INCISION AND DRAINAGE MOUTH  02/2013   " jaw area"  . WISDOM TOOTH EXTRACTION  ~  1997    There were no vitals filed for this visit.  Subjective Assessment - 11/28/17 1521    Subjective  Patient reports no much is new. Reports knee has fully improved after twisting it a couple weeks ago. Reports 60% improvement since initial HEP. Still having trouble with steps and work tolerance. Improvements in level of fatigue in the knees and decreased stiffness.     Pertinent History  liver cirrhosis, HTN, C5-6 fusion/decompression, OA, pancytopenia, thrombocytopenia, DM II, hx staph infection 2014, liver lesion, esophageal varices, GERD, anemia, GI bleed, HA, hx hepatitis A    Diagnostic tests  Per patient- had MRI and xray on B knees that showed arthritis     Patient Stated Goals  get better so I don't have to have surgery    Currently in Pain?  Yes    Pain Score  1     Pain Location  Knee    Pain Orientation  Right;Left    Pain Descriptors / Indicators  Dull    Pain Type  Acute pain         [facility] PT Assessment - 11/28/17 0001      AROM   Right Knee Extension  0    Right Knee Flexion  127    Left Knee Extension  0    Left Knee Flexion  126      Strength   Right/Left Hip  Right;Left    Right Hip Flexion  4+/5    Right Hip ABduction  4+/5    Right Hip ADduction  4/5    Left Hip Flexion  4+/5    Left Hip ABduction  4/5    Left Hip ADduction  4/5    Right/Left Knee  Right;Left    Right Knee Flexion  4/5    Right Knee Extension  4+/5    Left Knee Flexion  4+/5    Left Knee Extension  4/5    Right Ankle Dorsiflexion  4+/5    Right Ankle Plantar Flexion  4+/5    Left Ankle Dorsiflexion  4+/5    Left Ankle Plantar Flexion  4+/5                   [facility] Adult PT Treatment/Exercise - 11/28/17 0001      Knee/Hip Exercises: Stretches   Sports administrator  Both;30 seconds;Limitations;2 reps    Sports administrator  Limitations  prone strap      Knee/Hip Exercises: Aerobic   Nustep  L4x7 min      Knee/Hip Exercises: Machines for Strengthening   Cybex Leg Press  R & L in pain-free range; 10x 20# each      Knee/Hip Exercises: Standing   Terminal Knee Extension  Strengthening;Both;1 set;Theraband;15 reps    Theraband Level (Terminal Knee Extension)  Level 4 (Blue)  Terminal Knee Extension Limitations  VCs for upright trunk    Lateral Step Up  Right;Left;1 set;10 reps;Hand Hold: 1;Step Height: 6";Limitations c/o R knee pain when on R side    Lateral Step Up Limitations  1 ski pole    Forward Step Up  Right;Left;10 reps;Hand Hold: 1;Step Height: 6"    Forward Step Up Limitations  1 ski pole    Step Down  1 set;Right;Left;Hand Hold: 1;Step Height: 6";Limitations    Step Down Limitations  1 ski pole; VCs for slow eccentric lower    Wall Squat  1 set;15 reps;Limitations    Wall Squat Limitations  VCs to avoid painful range    Other Standing Knee Exercises  sidestepping with red TB around toes; 2x 59f VCs to avoid lateral trunk lean      Knee/Hip Exercises: Seated   Other Seated Knee/Hip Exercises  B hip ER with blue TB; 15x each      Knee/Hip Exercises: Prone   Other Prone Exercises  B donkey kicks; 10x each VCs to avoid twisting trunk      Manual Therapy   Manual Therapy  Taping    Kinesiotex  Create Space      Kinesiotix   Create Space  R knee patellofemoral x pattern             PT Education - 11/28/17 1615    Education Details  Edu on kinesio tape wear time, removal, precautions    Person(s) Educated  Patient    Methods  Explanation;Demonstration    Comprehension  Verbalized understanding       PT Short Term Goals - 11/28/17 1601      PT SHORT TERM GOAL #1   Title  Patient to be independent with initial HEP.    Time  3    Period  Weeks    Status  On-going does not report compliance        PT Long Term Goals - 11/28/17 1601      PT LONG TERM GOAL #1   Title  Patient  to be independent with advanced HEP.    Time  3    Period  Weeks    Status  On-going reporting noncompliance with HEP      PT LONG TERM GOAL #2   Title  Patient to demonstrate B LE strength >=4+/5    Time  6    Period  Weeks    Status  On-going improvements demonstrated in L knee flexion, extension and L ankle DF/PF strength      PT LONG TERM GOAL #3   Title  Patient to demonstrate B knee AROM WFL and pain free.    Time  6    Period  Weeks    Status  Partially Met improvement in B knee pain-free AROM demonstrated      PT LONG TERM GOAL #4   Title  Patient to demonstrate reciprocal stair climbing with 1  handrail,  good eccentric control, and no pain.    Time  6    Period  Weeks    Status  On-going poor eccentric control on descending with 1 hand support      PT LONG TERM GOAL #5   Title  Patient to report tolerance of 1 full work shift with <=2/10 pain.    Time  6    Period  Weeks    Status  On-going reports 6/10 pain at work  Plan - 11/28/17 1815    Clinical Impression Statement  Patient with no new complaints today. Reports 60% improvement since initial POC, however reporting noncompliance with HEP. Emphasized importance of continued and consistent compliance in order to maintain improvements. Patient reported understanding. Updated goals this session- improvements demonstrated in L knee flexion, extension and L ankle DF/PF strength and improvement in B knee pain-free AROM demonstrated. Patient still with poor eccentric control when descending down stair step, and still reporting moderate pain levels at work. Good tolerance of hip and quad strengthening exercises performed this session. Received kinesiotape to R knee at end of session for continued relief of symptoms. Educated patient on wear time, removal, and precautions. Patient reported understanding.     Clinical Impairments Affecting Rehab Potential  liver cirrhosis, HTN, C5-6 fusion/decompression, OA,  pancytopenia, thrombocytopenia, DM II, hx staph infection 2014, liver lesion, esophageal varices, GERD, anemia, GI bleed, HA, hx hepatitis A    PT Treatment/Interventions  ADLs/Self Care Home Management;Cryotherapy;Electrical Stimulation;Iontophoresis 61m/ml Dexamethasone;Moist Heat;Ultrasound;Gait training;Stair training;Functional mobility training;Therapeutic activities;Therapeutic exercise;Manual techniques;Passive range of motion;Patient/family education;Neuromuscular re-education;Balance training;Dry needling;Energy conservation;Splinting;Vasopneumatic Device;Taping    Consulted and Agree with Plan of Care  Patient       Patient will benefit from skilled therapeutic intervention in order to improve the following deficits and impairments:  Hypomobility, Decreased activity tolerance, Decreased strength, Pain, Difficulty walking, Decreased mobility, Decreased range of motion, Improper body mechanics, Postural dysfunction, Impaired flexibility, Hypermobility  Visit Diagnosis: Chronic pain of left knee  Chronic pain of right knee  Stiffness of left knee, not elsewhere classified  Stiffness of right knee, not elsewhere classified  Difficulty in walking, not elsewhere classified  Other symptoms and signs involving the musculoskeletal system     Problem List Patient Active Problem List   Diagnosis Date Noted  . Iron deficiency anemia due to chronic blood loss 11/04/2017  . Diabetic gastroparesis associated with type 2 diabetes mellitus ([place] 10/31/2017  . Dyspepsia 03/22/2017  . Thrombocytopenia ([place] in cirrhosis and portal hypertension 06/25/2016  . Portal hypertensive gastropathy ([place]   . Abdominal pain, epigastric 05/10/2016  . Obesity 06/18/2015  . Metabolic syndrome 088/41/6606 . GERD (gastroesophageal reflux disease)   . Secondary esophageal varices without bleeding ([place]   . Cervical herniated disc 05/30/2013  . insulin-dependent diabetes mellitus 04/27/2013  . Habitual  alcohol use 02/07/2013  . Other pancytopenia ([place] 06/15/2012  . Anemia 05/27/2012  . Hypertension 02/21/2011  . Hyperlipidemia 02/21/2011  . Other cirrhosis of liver ([place] 02/21/2011  . Elevated liver enzymes 02/21/2011    YJanene Harvey PT, DPT 11/28/17 6:20 PM   [facility] [address] [place] [id number]: [phone]  Fax:  [phone] Name: Oscar KOLODZIEJSKIMRN: [id number] of Birth: 5Oct 23, 1967 PHYSICAL THERAPY DISCHARGE SUMMARY  Visits from Start of Care: 6  Current functional level related to goals / functional outcomes: See above; could not assess as patient did not return since last session d/t being cleared by MD   Remaining deficits: Unable to assess   Education / Equipment: HEP  Plan: Patient agrees to discharge.  Patient goals were not met. Patient is being discharged due to the physician's request.  ?????     YJanene Harvey PT, DPT 12/13/17 3:19 PM

## 2017-11-29 ENCOUNTER — Ambulatory Visit (INDEPENDENT_AMBULATORY_CARE_PROVIDER_SITE_OTHER): Payer: BLUE CROSS/BLUE SHIELD | Admitting: Internal Medicine

## 2017-11-29 ENCOUNTER — Other Ambulatory Visit: Payer: Self-pay | Admitting: Internal Medicine

## 2017-11-29 ENCOUNTER — Encounter: Payer: Self-pay | Admitting: Internal Medicine

## 2017-11-29 ENCOUNTER — Other Ambulatory Visit: Payer: BLUE CROSS/BLUE SHIELD | Admitting: Internal Medicine

## 2017-11-29 VITALS — BP 110/70 | HR 68 | Ht 68.0 in | Wt 282.0 lb

## 2017-11-29 DIAGNOSIS — Z13220 Encounter for screening for lipoid disorders: Secondary | ICD-10-CM | POA: Diagnosis not present

## 2017-11-29 DIAGNOSIS — Z86010 Personal history of colonic polyps: Secondary | ICD-10-CM

## 2017-11-29 DIAGNOSIS — I10 Essential (primary) hypertension: Secondary | ICD-10-CM

## 2017-11-29 DIAGNOSIS — E8881 Metabolic syndrome: Secondary | ICD-10-CM

## 2017-11-29 DIAGNOSIS — Z0000 Encounter for general adult medical examination without abnormal findings: Secondary | ICD-10-CM

## 2017-11-29 DIAGNOSIS — E119 Type 2 diabetes mellitus without complications: Secondary | ICD-10-CM | POA: Diagnosis not present

## 2017-11-29 DIAGNOSIS — I8510 Secondary esophageal varices without bleeding: Secondary | ICD-10-CM

## 2017-11-29 DIAGNOSIS — Z794 Long term (current) use of insulin: Secondary | ICD-10-CM | POA: Diagnosis not present

## 2017-11-29 DIAGNOSIS — K7030 Alcoholic cirrhosis of liver without ascites: Secondary | ICD-10-CM | POA: Diagnosis not present

## 2017-11-29 DIAGNOSIS — R17 Unspecified jaundice: Secondary | ICD-10-CM | POA: Diagnosis not present

## 2017-11-29 DIAGNOSIS — Z125 Encounter for screening for malignant neoplasm of prostate: Secondary | ICD-10-CM | POA: Diagnosis not present

## 2017-11-29 DIAGNOSIS — E7849 Other hyperlipidemia: Secondary | ICD-10-CM | POA: Diagnosis not present

## 2017-11-29 DIAGNOSIS — K219 Gastro-esophageal reflux disease without esophagitis: Secondary | ICD-10-CM

## 2017-11-29 DIAGNOSIS — E1142 Type 2 diabetes mellitus with diabetic polyneuropathy: Secondary | ICD-10-CM

## 2017-11-29 DIAGNOSIS — K7460 Unspecified cirrhosis of liver: Secondary | ICD-10-CM

## 2017-11-29 DIAGNOSIS — D708 Other neutropenia: Secondary | ICD-10-CM

## 2017-11-29 NOTE — Progress Notes (Signed)
Subjective:    Patient ID: Oscar [name], male    DOB: 22-Aug-1965, 52 y.o.   MRN: [id number]  HPI 52 year old Male for routine health maintenance and evaluation of multiple medical issues including hypertension, insulin-dependent diabetes mellitus treated by Dr.Balan is, endocrinologist.  History of cirrhosis.  Has esophageal varices.  History of leukopenia and thrombocytopenia thought to be related to alcohol.  Has seen Dr. Marin Olp is for this.  History of obstructive sleep apnea, obesity, GE reflux.  Multiple admissions in the past for GI bleeding but none recently.  Had endoscopy in 2018 for esophageal varices monitoring.  Had no varices and proximal stomach.  Had grade 2 less than 5 mm esophageal varices.  Colonoscopy had polyp removed and post polypectomy had bleeding due to cirrhosis.  Hemostasis was achieved after placement of clips.  These polyps were tubular adenomas and hyperplastic polyps with 3 to 5-year follow-up recommended.  Continues to work in a warehouse.  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  Social history: Married.  Says he no longer drinks alcohol or uses drugs.  Non-smoker.  No children  Pneumococcal vaccines are up-to-date.  In 2015 he had a C5-C6 anterior discectomy by Dr. Arnoldo Morale  Patient says NovoLog is been discontinued.  Scheduled to see Dr. Carlean Purl August 20 for colonoscopy.  Addendum: He had a polyp removed and had post procedure rectal bleeding requiring observation in the hospital.Subsequently had a flexible sigmoidoscopy and 4 clips were placed at bleeding site.  Dr. Carlean Purl would like him changed from Tenormin to nadolol because of varices this is a better choice. He also needs follow-up so I will see him in the office the week of August 2016.  He  History of sleep apnea treated by Dr. Annamaria Boots in 2006 with CPAP but currently not using that device.Lucrezia [place] of [place] it causes nausea and  vomiting  Dr. Bing Plume was his eye physician but his insurance changed and he found a physician in [facility].  Reminded about annual eye exam.  No known drug allergies  He takes Neurontin for peripheral neuropathy.  He takes PPI.  He is insulin-dependent.  Says his diabetes is under fairly good control.  He is overweight.  Family history: Father died from complications of surgery for kidney cancer.  Mother with history of hypertension is deceased.  9 siblings.  One brother has had a kidney transplant.  Cough      Review of Systems  Constitutional: Negative.   All other systems reviewed and are negative.      Objective:   Physical Exam  Constitutional: He is oriented to person, place, and time. He appears well-developed and well-nourished.  HENT:  Head: Normocephalic and atraumatic.  Right Ear: External ear normal.  Left Ear: External ear normal.  Mouth/Throat: Oropharynx is clear and moist.  Eyes: Conjunctivae are normal. Right eye exhibits no discharge. Left eye exhibits no discharge. No scleral icterus.  Neck: Neck supple. No JVD present.  Cardiovascular: Normal rate, regular rhythm, normal heart sounds and intact distal pulses.  No murmur heard. Pulmonary/Chest: No stridor. No respiratory distress. He has no wheezes.  Abdominal: Soft. Bowel sounds are normal. He exhibits no distension. There is no tenderness. There is no rebound and no guarding.  Genitourinary: Prostate normal.  Musculoskeletal: He exhibits no edema.  Lymphadenopathy:    He has no cervical adenopathy.  Neurological: He is alert and oriented to person, place, and  time. He displays normal reflexes. No cranial nerve deficit. He exhibits normal muscle tone. Coordination normal.  Skin: Skin is warm and dry.  Psychiatric: He has a normal mood and affect. His behavior is normal. Judgment and thought content normal.  Vitals reviewed.   Impression:  Essential hypertension-stable.Kidney functions  normal.  History of insulin-dependent diabetes but says he is no longer on NovoLog. Hgb A1c stable at 7.3%  History of cirrhosis  Esophageal varices  Thrombocytopenia and leukopenia related to history of alcohol abuse followed by hematologist  GE reflux-treated with PPI  History of gastroparesis tried on Reglan  Fatty liver  Obesity-discussed diet exercise and weight loss  Multiple admissions for GI bleed  History of alcohol abuse but denies drinking at the present time  Scheduled for upcoming colonoscopy.  Continue close follow-up with endocrinologist.  Continue same medications and return in 1 year or as needed.      Assessment & Plan:

## 2017-11-30 LAB — LIPID PANEL
Cholesterol: 137 mg/dL (ref <200)
HDL: 55 mg/dL (ref >40)
LDL Cholesterol (Calc): 67 mg/dL (calc)
Non-HDL Cholesterol (Calc): 82 mg/dL (calc) (ref <130)
TRIGLYCERIDES: 71 mg/dL (ref <150)
Total CHOL/HDL Ratio: 2.5 (calc) (ref <5.0)

## 2017-11-30 LAB — MICROALBUMIN / CREATININE URINE RATIO
CREATININE, URINE: 140 mg/dL (ref 20–320)
Microalb Creat Ratio: 8 mcg/mg creat (ref <30)
Microalb, Ur: 1.1 mg/dL

## 2017-11-30 LAB — PSA: PSA: 0.4 ng/mL (ref <=4.0)

## 2017-11-30 LAB — HEMOGLOBIN A1C
Hgb A1c MFr Bld: 7.3 % of total Hgb — ABNORMAL HIGH (ref <5.7)
Mean Plasma Glucose: 163 (calc)
eAG (mmol/L): 9 (calc)

## 2017-12-01 ENCOUNTER — Encounter: Payer: BLUE CROSS/BLUE SHIELD | Admitting: Physical Therapy

## 2017-12-01 DIAGNOSIS — M2241 Chondromalacia patellae, right knee: Secondary | ICD-10-CM | POA: Diagnosis not present

## 2017-12-01 DIAGNOSIS — M2242 Chondromalacia patellae, left knee: Secondary | ICD-10-CM | POA: Diagnosis not present

## 2017-12-05 ENCOUNTER — Ambulatory Visit: Payer: BLUE CROSS/BLUE SHIELD | Admitting: Physical Therapy

## 2017-12-07 ENCOUNTER — Encounter: Payer: Self-pay | Admitting: Internal Medicine

## 2017-12-12 ENCOUNTER — Other Ambulatory Visit: Payer: Self-pay | Admitting: Internal Medicine

## 2017-12-12 ENCOUNTER — Ambulatory Visit: Payer: BLUE CROSS/BLUE SHIELD | Admitting: Physical Therapy

## 2017-12-13 ENCOUNTER — Encounter: Payer: Self-pay | Admitting: Internal Medicine

## 2017-12-13 ENCOUNTER — Ambulatory Visit (AMBULATORY_SURGERY_CENTER): Payer: BLUE CROSS/BLUE SHIELD | Admitting: Internal Medicine

## 2017-12-13 VITALS — BP 125/72 | HR 60 | Temp 98.4°F | Resp 18 | Ht 68.0 in | Wt 290.0 lb

## 2017-12-13 DIAGNOSIS — D125 Benign neoplasm of sigmoid colon: Secondary | ICD-10-CM | POA: Diagnosis not present

## 2017-12-13 DIAGNOSIS — D122 Benign neoplasm of ascending colon: Secondary | ICD-10-CM

## 2017-12-13 DIAGNOSIS — I8510 Secondary esophageal varices without bleeding: Secondary | ICD-10-CM

## 2017-12-13 DIAGNOSIS — Z86010 Personal history of colonic polyps: Secondary | ICD-10-CM | POA: Diagnosis not present

## 2017-12-13 DIAGNOSIS — K3189 Other diseases of stomach and duodenum: Secondary | ICD-10-CM | POA: Diagnosis not present

## 2017-12-13 DIAGNOSIS — K766 Portal hypertension: Secondary | ICD-10-CM

## 2017-12-13 DIAGNOSIS — K635 Polyp of colon: Secondary | ICD-10-CM | POA: Diagnosis not present

## 2017-12-13 DIAGNOSIS — Z1211 Encounter for screening for malignant neoplasm of colon: Secondary | ICD-10-CM | POA: Diagnosis not present

## 2017-12-13 DIAGNOSIS — D123 Benign neoplasm of transverse colon: Secondary | ICD-10-CM

## 2017-12-13 MED ORDER — SODIUM CHLORIDE 0.9 % IV SOLN: 500.0000 mL | Freq: Once | INTRAVENOUS | Status: DC

## 2017-12-13 NOTE — Progress Notes (Signed)
Pt's states no medical or surgical changes since previsit or office visit. 

## 2017-12-13 NOTE — Patient Instructions (Addendum)
The esophageal varices and stomach changes look about the same. The varices might be just slightly larger.  I found and removed 4 polyps, 3 tiny and one medium-size.  I will let you know pathology results and when to have another routine colonoscopy by mail and/or My Chart. I appreciate the opportunity to care for you.  I should see you again in November or December - please call in September/October for an appointment then.  I appreciate the opportunity to care for you. Gatha Mayer, MD, [facility]  *Handout on polyps given  no aspirin, ibuprofen, naproxen, or other non steroidal anti inflammatory drugs for two weeks**    YOU HAD AN ENDOSCOPIC PROCEDURE TODAY AT [facility]:   Refer to the procedure report that was given to you for any specific questions about what was found during the examination.  If the procedure report does not answer your questions, please call your gastroenterologist to clarify.  If you requested that your care partner not be given the details of your procedure findings, then the procedure report has been included in a sealed envelope for you to review at your convenience later.  YOU SHOULD EXPECT: Some feelings of bloating in the abdomen. Passage of more gas than usual.  Walking can help get rid of the air that was put into your GI tract during the procedure and reduce the bloating. If you had a lower endoscopy (such as a colonoscopy or flexible sigmoidoscopy) you may notice spotting of blood in your stool or on the toilet paper. If you underwent a bowel prep for your procedure, you may not have a normal bowel movement for a few days.  Please Note:  You might notice some irritation and congestion in your nose or some drainage.  This is from the oxygen used during your procedure.  There is no need for concern and it should clear up in a day or so.  SYMPTOMS TO REPORT IMMEDIATELY:   Following lower endoscopy (colonoscopy or flexible  sigmoidoscopy):  Excessive amounts of blood in the stool  Significant tenderness or worsening of abdominal pains  Swelling of the abdomen that is new, acute  Fever of 100F or higher   Following upper endoscopy (EGD)  Vomiting of blood or coffee ground material  New chest pain or pain under the shoulder blades  Painful or persistently difficult swallowing  New shortness of breath  Fever of 100F or higher  Black, tarry-looking stools  For urgent or emergent issues, a gastroenterologist can be reached at any hour by calling [phone].   DIET:  We do recommend a small meal at first, but then you may proceed to your regular diet.  Drink plenty of fluids but you should avoid alcoholic beverages for 24 hours.  ACTIVITY:  You should plan to take it easy for the rest of today and you should NOT DRIVE or use heavy machinery until tomorrow (because of the sedation medicines used during the test).    FOLLOW UP: Our staff will call the number listed on your records the next business day following your procedure to check on you and address any questions or concerns that you may have regarding the information given to you following your procedure. If we do not reach you, we will leave a message.  However, if you are feeling well and you are not experiencing any problems, there is no need to return our call.  We will assume that you have returned to your regular  daily activities without incident.  If any biopsies were taken you will be contacted by phone or by letter within the next 1-3 weeks.  Please call us at [phone] if you have not heard about the biopsies in 3 weeks.    SIGNATURES/CONFIDENTIALITY: You and/or your care partner have signed paperwork which will be entered into your electronic medical record.  These signatures attest to the fact that that the information above on your After Visit Summary has been reviewed and is understood.  Full responsibility of the confidentiality of  this discharge information lies with you and/or your care-partner.

## 2017-12-13 NOTE — Progress Notes (Signed)
Spontaneous respirations throughout. VSS. Resting comfortably. To PACU on room air. Report to  RN. 

## 2017-12-13 NOTE — Op Note (Signed)
[place] Patient Name: Oscar [name] Procedure Date: 12/13/2017 10:25 AM MRN: [id number] Endoscopist: Gatha Mayer , MD Age: 52 Referring MD:  Date of Birth: 12-Oct-1965 Gender: Male Account #: [id number] Procedure:                Colonoscopy Indications:              Surveillance: Personal history of adenomatous                            polyps on last colonoscopy > 3 years ago Medicines:                Propofol per Anesthesia, Monitored Anesthesia Care Procedure:                Pre-Anesthesia Assessment:                           - Prior to the procedure, a History and Physical                            was performed, and patient medications and                            allergies were reviewed. The patient's tolerance of                            previous anesthesia was also reviewed. The risks                            and benefits of the procedure and the sedation                            options and risks were discussed with the patient.                            All questions were answered, and informed consent                            was obtained. Prior Anticoagulants: The patient has                            taken no previous anticoagulant or antiplatelet                            agents. ASA Grade Assessment: III - A patient with                            severe systemic disease. After reviewing the risks                            and benefits, the patient was deemed in                            satisfactory condition to undergo the procedure.  After obtaining informed consent, the colonoscope                            was passed under direct vision. Throughout the                            procedure, the patient's blood pressure, pulse, and                            oxygen saturations were monitored continuously. The                            Colonoscope was introduced through the anus and                             advanced to the the cecum, identified by                            appendiceal orifice and ileocecal valve. The                            colonoscopy was performed without difficulty. The                            patient tolerated the procedure well. The quality                            of the bowel preparation was good. The ileocecal                            valve, appendiceal orifice, and rectum were                            photographed. The bowel preparation used was                            Miralax. Scope In: 10:37:56 AM Scope Out: 10:52:34 AM Scope Withdrawal Time: 0 hours 13 minutes 3 seconds  Total Procedure Duration: 0 hours 14 minutes 38 seconds  Findings:                 The perianal and digital rectal examinations were                            normal. Pertinent negatives include normal prostate                            (size, shape, and consistency).                           A 10 mm polyp was found in the proximal sigmoid                            colon. The polyp was semi-pedunculated. The polyp  was removed with a hot snare. Resection and                            retrieval were complete. Verification of patient                            identification for the specimen was done. Estimated                            blood loss: none.                           Three flat polyps were found in the transverse                            colon and ascending colon. The polyps were 1 to 2                            mm in size. These polyps were removed with a cold                            biopsy forceps. Resection and retrieval were                            complete. Verification of patient identification                            for the specimen was done. Estimated blood loss was                            minimal.                           Small rectal varices were found.                           The exam was otherwise  without abnormality on                            direct and retroflexion views. Complications:            No immediate complications. Estimated Blood Loss:     Estimated blood loss was minimal. Impression:               - One 10 mm polyp in the proximal sigmoid colon,                            removed with a hot snare. Resected and retrieved.                           - Three 1 to 2 mm polyps in the transverse colon                            and in the ascending colon, removed with a cold  biopsy forceps. Resected and retrieved.                           - Rectal varices.                           - The examination was otherwise normal on direct                            and retroflexion views.                           - Personal history of colonic polyps. Recommendation:           - Patient has a contact number available for                            emergencies. The signs and symptoms of potential                            delayed complications were discussed with the                            patient. Return to normal activities tomorrow.                            Written discharge instructions were provided to the                            patient.                           - Resume previous diet.                           - Continue present medications.                           - No aspirin, ibuprofen, naproxen, or other                            non-steroidal anti-inflammatory drugs for 2 weeks                            after polyp removal.                           - Repeat colonoscopy is recommended for                            surveillance. The colonoscopy date will be                            determined after pathology results from today's                            exam become available for review. Gatha Mayer,  MD 12/13/2017 11:19:32 AM This report has been signed electronically.

## 2017-12-13 NOTE — Progress Notes (Signed)
Called to room to assist during endoscopic procedure.  Patient ID and intended procedure confirmed with present staff. Received instructions for my participation in the procedure from the performing physician.  

## 2017-12-13 NOTE — Op Note (Addendum)
[place] Patient Name: Oscar [name] Procedure Date: 12/13/2017 10:25 AM MRN: [id number] Endoscopist: Gatha Mayer , MD Age: 52 Referring MD:  Date of Birth: May 13, 1965 Gender: Male Account #: [id number] Procedure:                Upper GI endoscopy Indications:              Esophageal varices Medicines:                Propofol per Anesthesia, Monitored Anesthesia Care Procedure:                Pre-Anesthesia Assessment:                           - Prior to the procedure, a History and Physical                            was performed, and patient medications and                            allergies were reviewed. The patient's tolerance of                            previous anesthesia was also reviewed. The risks                            and benefits of the procedure and the sedation                            options and risks were discussed with the patient.                            All questions were answered, and informed consent                            was obtained. Prior Anticoagulants: The patient has                            taken no previous anticoagulant or antiplatelet                            agents. ASA Grade Assessment: III - A patient with                            severe systemic disease. After reviewing the risks                            and benefits, the patient was deemed in                            satisfactory condition to undergo the procedure.                           After obtaining informed consent, the endoscope was  passed under direct vision. Throughout the                            procedure, the patient's blood pressure, pulse, and                            oxygen saturations were monitored continuously. The                            Endoscope was introduced through the mouth, and                            advanced to the second part of duodenum. The upper                            GI  endoscopy was accomplished without difficulty.                            The patient tolerated the procedure well. Scope In: Scope Out: Findings:                 Grade II varices were found in the lower third of                            the esophagus. They were 5 mm in largest diameter.                            Estimated blood loss: none.                           Moderate portal hypertensive gastropathy was found                            in the stomach. Snakeskin changes proximal and                            erythematous polypoid changes antrum - as last year                            Estimated blood loss: none.                           The exam was otherwise without abnormality.                           The cardia and gastric fundus were normal on                            retroflexion. Complications:            No immediate complications. Estimated Blood Loss:     Estimated blood loss: none. Impression:               - Grade II esophageal varices. Max 5 mm - no  stigmata of bleeding                           - Portal hypertensive gastropathy.                           - The examination was otherwise normal.                           - No specimens collected. Recommendation:           - Patient has a contact number available for                            emergencies. The signs and symptoms of potential                            delayed complications were discussed with the                            patient. Return to normal activities tomorrow.                            Written discharge instructions were provided to the                            patient.                           - Resume previous diet.                           - Continue present medications. I think should                            swithch atenolol to nadolol for variceal bleeding                            prophylaxis. Will coordinate with PCP.                            - See the other procedure note for documentation of                            additional recommendations.                           - Repeat upper endoscopy in 1 year for                            surveillance. If goes on non-selective beta blocker                            will not need this but place recall now Gatha Mayer, MD 12/13/2017 11:13:56 AM This report has been signed electronically.

## 2017-12-14 ENCOUNTER — Other Ambulatory Visit: Payer: Self-pay

## 2017-12-14 ENCOUNTER — Telehealth: Payer: Self-pay | Admitting: Internal Medicine

## 2017-12-14 ENCOUNTER — Encounter (HOSPITAL_COMMUNITY)
Admission: EM | Disposition: A | Discharge status: Home / Self Care | Payer: Self-pay | Source: Home / Self Care | Attending: Emergency Medicine

## 2017-12-14 ENCOUNTER — Other Ambulatory Visit: Payer: Self-pay | Admitting: Gastroenterology

## 2017-12-14 ENCOUNTER — Ambulatory Visit (HOSPITAL_COMMUNITY)
Admission: EM | Admit: 2017-12-14 | Discharge: 2017-12-14 | Disposition: A | Discharge status: Home / Self Care | Payer: BLUE CROSS/BLUE SHIELD | Attending: Emergency Medicine | Admitting: Emergency Medicine

## 2017-12-14 ENCOUNTER — Telehealth: Payer: Self-pay | Admitting: *Deleted

## 2017-12-14 ENCOUNTER — Encounter (HOSPITAL_COMMUNITY): Payer: Self-pay

## 2017-12-14 DIAGNOSIS — Z6841 Body Mass Index (BMI) 40.0 and over, adult: Secondary | ICD-10-CM | POA: Insufficient documentation

## 2017-12-14 DIAGNOSIS — R161 Splenomegaly, not elsewhere classified: Secondary | ICD-10-CM | POA: Insufficient documentation

## 2017-12-14 DIAGNOSIS — M170 Bilateral primary osteoarthritis of knee: Secondary | ICD-10-CM | POA: Diagnosis not present

## 2017-12-14 DIAGNOSIS — K766 Portal hypertension: Secondary | ICD-10-CM | POA: Diagnosis not present

## 2017-12-14 DIAGNOSIS — K922 Gastrointestinal hemorrhage, unspecified: Secondary | ICD-10-CM | POA: Insufficient documentation

## 2017-12-14 DIAGNOSIS — Z86010 Personal history of colonic polyps: Secondary | ICD-10-CM | POA: Insufficient documentation

## 2017-12-14 DIAGNOSIS — K3184 Gastroparesis: Secondary | ICD-10-CM | POA: Diagnosis not present

## 2017-12-14 DIAGNOSIS — Z794 Long term (current) use of insulin: Secondary | ICD-10-CM | POA: Diagnosis not present

## 2017-12-14 DIAGNOSIS — K7460 Unspecified cirrhosis of liver: Secondary | ICD-10-CM | POA: Diagnosis not present

## 2017-12-14 DIAGNOSIS — K91840 Postprocedural hemorrhage and hematoma of a digestive system organ or structure following a digestive system procedure: Secondary | ICD-10-CM | POA: Diagnosis not present

## 2017-12-14 DIAGNOSIS — M19042 Primary osteoarthritis, left hand: Secondary | ICD-10-CM | POA: Diagnosis not present

## 2017-12-14 DIAGNOSIS — K921 Melena: Secondary | ICD-10-CM | POA: Insufficient documentation

## 2017-12-14 DIAGNOSIS — K633 Ulcer of intestine: Secondary | ICD-10-CM | POA: Diagnosis not present

## 2017-12-14 DIAGNOSIS — E669 Obesity, unspecified: Secondary | ICD-10-CM | POA: Diagnosis not present

## 2017-12-14 DIAGNOSIS — M19041 Primary osteoarthritis, right hand: Secondary | ICD-10-CM | POA: Diagnosis not present

## 2017-12-14 DIAGNOSIS — Y838 Other surgical procedures as the cause of abnormal reaction of the patient, or of later complication, without mention of misadventure at the time of the procedure: Secondary | ICD-10-CM | POA: Insufficient documentation

## 2017-12-14 DIAGNOSIS — Z79899 Other long term (current) drug therapy: Secondary | ICD-10-CM | POA: Diagnosis not present

## 2017-12-14 DIAGNOSIS — K644 Residual hemorrhoidal skin tags: Secondary | ICD-10-CM | POA: Insufficient documentation

## 2017-12-14 DIAGNOSIS — K3189 Other diseases of stomach and duodenum: Secondary | ICD-10-CM | POA: Diagnosis not present

## 2017-12-14 DIAGNOSIS — Z888 Allergy status to other drugs, medicaments and biological substances status: Secondary | ICD-10-CM | POA: Insufficient documentation

## 2017-12-14 DIAGNOSIS — E1143 Type 2 diabetes mellitus with diabetic autonomic (poly)neuropathy: Secondary | ICD-10-CM | POA: Insufficient documentation

## 2017-12-14 DIAGNOSIS — Z981 Arthrodesis status: Secondary | ICD-10-CM | POA: Insufficient documentation

## 2017-12-14 DIAGNOSIS — I10 Essential (primary) hypertension: Secondary | ICD-10-CM | POA: Insufficient documentation

## 2017-12-14 DIAGNOSIS — Z9889 Other specified postprocedural states: Secondary | ICD-10-CM | POA: Diagnosis not present

## 2017-12-14 DIAGNOSIS — M19022 Primary osteoarthritis, left elbow: Secondary | ICD-10-CM | POA: Insufficient documentation

## 2017-12-14 DIAGNOSIS — K219 Gastro-esophageal reflux disease without esophagitis: Secondary | ICD-10-CM | POA: Insufficient documentation

## 2017-12-14 DIAGNOSIS — I8510 Secondary esophageal varices without bleeding: Secondary | ICD-10-CM | POA: Insufficient documentation

## 2017-12-14 DIAGNOSIS — K7581 Nonalcoholic steatohepatitis (NASH): Secondary | ICD-10-CM | POA: Insufficient documentation

## 2017-12-14 DIAGNOSIS — M19021 Primary osteoarthritis, right elbow: Secondary | ICD-10-CM | POA: Diagnosis not present

## 2017-12-14 HISTORY — PX: HOT HEMOSTASIS: SHX5433

## 2017-12-14 HISTORY — PX: FLEXIBLE SIGMOIDOSCOPY: SHX5431

## 2017-12-14 LAB — COMPREHENSIVE METABOLIC PANEL
ALK PHOS: 97 U/L (ref 38–126)
ALT: 23 U/L (ref 0–44)
AST: 23 U/L (ref 15–41)
Albumin: 3.4 g/dL — ABNORMAL LOW (ref 3.5–5.0)
Anion gap: 6 (ref 5–15)
BUN: 6 mg/dL (ref 6–20)
CALCIUM: 8.9 mg/dL (ref 8.9–10.3)
CO2: 26 mmol/L (ref 22–32)
CREATININE: 0.78 mg/dL (ref 0.61–1.24)
Chloride: 106 mmol/L (ref 98–111)
Glucose, Bld: 223 mg/dL — ABNORMAL HIGH (ref 70–99)
Potassium: 4.5 mmol/L (ref 3.5–5.1)
SODIUM: 138 mmol/L (ref 135–145)
Total Bilirubin: 1.5 mg/dL — ABNORMAL HIGH (ref 0.3–1.2)
Total Protein: 6.5 g/dL (ref 6.5–8.1)

## 2017-12-14 LAB — TYPE AND SCREEN
ABO/RH(D): B POS
Antibody Screen: NEGATIVE

## 2017-12-14 LAB — CBC
HCT: 40.8 % (ref 39.0–52.0)
Hemoglobin: 14.4 g/dL (ref 13.0–17.0)
MCH: 33.5 pg (ref 26.0–34.0)
MCHC: 35.3 g/dL (ref 30.0–36.0)
MCV: 94.9 fL (ref 78.0–100.0)
PLATELETS: 94 10*3/uL — AB (ref 150–400)
RBC: 4.3 MIL/uL (ref 4.22–5.81)
RDW: 14.1 % (ref 11.5–15.5)
WBC: 4.6 10*3/uL (ref 4.0–10.5)

## 2017-12-14 LAB — HEMOGLOBIN AND HEMATOCRIT, BLOOD
HEMATOCRIT: 35.6 % — AB (ref 39.0–52.0)
HEMOGLOBIN: 12.5 g/dL — AB (ref 13.0–17.0)

## 2017-12-14 LAB — PROTIME-INR
INR: 1.16
INR: 1.25
PROTHROMBIN TIME: 14.7 s (ref 11.4–15.2)
PROTHROMBIN TIME: 15.6 s — AB (ref 11.4–15.2)

## 2017-12-14 LAB — APTT
aPTT: 33 seconds (ref 24–36)
aPTT: 33 seconds (ref 24–36)

## 2017-12-14 LAB — CBG MONITORING, ED: GLUCOSE-CAPILLARY: 209 mg/dL — AB (ref 70–99)

## 2017-12-14 SURGERY — SIGMOIDOSCOPY, FLEXIBLE
Anesthesia: Moderate Sedation

## 2017-12-14 MED ORDER — SODIUM CHLORIDE 0.9 % IV SOLN
Freq: Once | INTRAVENOUS | Status: AC
  Administered 2017-12-14: 13:00:00 via INTRAVENOUS

## 2017-12-14 MED ORDER — FENTANYL CITRATE (PF) 100 MCG/2ML IJ SOLN
INTRAMUSCULAR | Status: AC
  Filled 2017-12-14: qty 4

## 2017-12-14 MED ORDER — MIDAZOLAM HCL 5 MG/ML IJ SOLN
INTRAMUSCULAR | Status: AC
  Filled 2017-12-14: qty 3

## 2017-12-14 MED ORDER — MIDAZOLAM HCL 10 MG/2ML IJ SOLN
INTRAMUSCULAR | Status: DC | PRN
  Administered 2017-12-14: 2 mg via INTRAVENOUS
  Administered 2017-12-14: 1 mg via INTRAVENOUS
  Administered 2017-12-14: 2 mg via INTRAVENOUS

## 2017-12-14 MED ORDER — FENTANYL CITRATE (PF) 100 MCG/2ML IJ SOLN
INTRAMUSCULAR | Status: DC | PRN
  Administered 2017-12-14 (×3): 25 ug via INTRAVENOUS

## 2017-12-14 MED ORDER — DIPHENHYDRAMINE HCL 50 MG/ML IJ SOLN
INTRAMUSCULAR | Status: AC
  Filled 2017-12-14: qty 1

## 2017-12-14 NOTE — Telephone Encounter (Signed)
  Follow up Call-  Call back number 12/13/2017  Post procedure Call Back phone  # [phone]  Permission to leave phone message Yes  Some recent data might be hidden     Patient questions:  Do you have a fever, pain , or abdominal swelling? No. Pain Score  0 *  Have you tolerated food without any problems? Yes.    Have you been able to return to your normal activities? No.  Do you have any questions about your discharge instructions: Diet   No. Medications  No. Follow up visit  No.  Do you have questions or concerns about your Care? No.  Actions: * If pain score is 4 or above: No action needed, pain <4. Pts wife answered on f/u call and said she has spoken with GI physician on call due to pt has had rectal  bleeding each time he has gone to bathroom x5 and she and the pt are on the way to [facility] ER as they wer instructed to do by GI physician on call.

## 2017-12-14 NOTE — H&P (Signed)
Patient was seen and evaluated as per the consultation note. No interval changes were noted on his examination. Plan for a Flexible sigmoidoscopy with intention of finding a source of bleeding (likely previously removed hot snare polyp in the [facility]). The risks and benefits of endoscopic evaluation were discussed with the patient; these include but are not limited to the risk of perforation, infection, bleeding, missed lesions, lack of diagnosis, severe illness requiring hospitalization, as well as anesthesia and sedation related illnesses.  The patient is agreeable to proceed.   Justice Britain, MD [facility] Advanced Endoscopy Office # [phone]

## 2017-12-14 NOTE — ED Notes (Signed)
Gastro MD's in room at this time.

## 2017-12-14 NOTE — Telephone Encounter (Signed)
Will see when he arrives at [facility] vs [facility] and determine timing of possible Flex vs Watch & Wait. Will keep team UTD.

## 2017-12-14 NOTE — Op Note (Signed)
[facility] Patient Name: Oscar [name] Procedure Date : 12/14/2017 MRN: [id number] Attending MD: Justice Britain , MD Date of Birth: Nov 24, 1965 CSN: [id number] Age: 52 Admit Type: Inpatient Procedure:                Flexible Sigmoidoscopy Indications:              Rectal hemorrhage, Hematochezia, Personal history                            of colonic polyps, Follow-up endoscopy after surgery Providers:                Justice Britain, MD, Kingsley Plan, RN,                            Alan Mulder, Technician Referring MD:             Gatha Mayer, MD, Charlesetta Shanks, MD, Azucena Freed PA, PA Medicines:                Fentanyl 75 micrograms IV, Midazolam 5 mg IV Complications:            No immediate complications. Estimated Blood Loss:     Estimated blood loss: none. Procedure:                Pre-Anesthesia Assessment:                           - Prior to the procedure, a History and Physical                            was performed, and patient medications and                            allergies were reviewed. The patient's tolerance of                            previous anesthesia was also reviewed. The risks                            and benefits of the procedure and the sedation                            options and risks were discussed with the patient.                            All questions were answered, and informed consent                            was obtained. Prior Anticoagulants: The patient has                            taken no previous anticoagulant or antiplatelet  agents. ASA Grade Assessment: III - A patient with                            severe systemic disease. After reviewing the risks                            and benefits, the patient was deemed in                            satisfactory condition to undergo the procedure.                           After obtaining  informed consent, the scope was                            passed under direct vision. The GIF-H190 ([id number])                            Olympus Adult EGD was introduced through the anus                            and advanced to the the descending colon. The                            flexible sigmoidoscopy was technically difficult                            and complex due to positioning of the region of the                            visible vessel. Successful completion of the                            procedure was aided by changing the patient to a                            prone position. The quality of the bowel                            preparation was poor. Scope In: Scope Out: Findings:      The digital rectal exam findings include non-thrombosed external       hemorrhoids. Pertinent negatives include Old blood was noted.      Hematin and old clot (altered blood material) was found in the rectum,       in the recto-sigmoid colon, in the sigmoid colon, in the descending       colon and in the transverse colon.      A post-polypectomy, single 8 mm ulcer with a protruding visible vessel       was found in the sigmoid colon at approximately 30 cm. Small oozing was       present. For hemostasis, four hemostatic clips were successfully placed       (MR conditional). There was no bleeding at the end of the placement of  the clips and while monitoring for approixmately 5 minutes after       placement of the last clip.      The rest of the colon was not able to be visualized in its entirety due       to the amount of old blood that was within the colon but no other       visible vessels or ulcerated lesions were noted grossly. Impression:               - Preparation of the colon was poor.                           - Non-thrombosed external hemorrhoids found on                            digital rectal exam.                           - Altered, old blood in the rectum, in the                             recto-sigmoid colon, in the sigmoid colon, in the                            descending colon and in the transverse colon.                           - A single ulcer (post-polypectomy) with a visible                            vessel and stigmata of recent and ongoing bleeding                            was found in the sigmoid colon. Clips (MR                            conditional) were placed for hemostasis. Moderate Sedation:      Moderate (conscious) sedation was administered by the endoscopy nurse       and supervised by the endoscopist. The following parameters were       monitored: oxygen saturation, heart rate, blood pressure, and response       to care. Total physician intraservice time was 30 minutes. Recommendation:           - Return care to the ED team.                           - Send Hgb now (post Flex to evaluate overall                            stability).                           - Discussed with patient and wife about close  monitoring at home vs observation in the hospital.                            After long discussion, decision was made that if                            the patient is hemodynamically stable and does not                            have significant drop in Hgb (less than 10-11), it                            would be reasonable to be discharged with close                            follow up and repeat CBC at [facility] tomorrow. If                            however, there is hemodynamic instability or                            concern for pre-syncope and/or progressive anemia,                            a close observation overnight into tomorrow would                            be reasonable and we would appreciate the                            hospitalist service if necessary.                           - The findings and recommendations were discussed                            with the  patient.                           - The findings and recommendations were discussed                            with the patient's family.                           - The findings and recommendations were discussed                            with the referring physician. Procedure Code(s):        --- Professional ---                           [id number], Sigmoidoscopy, flexible; with control of  bleeding, any method                           G0500, Moderate sedation services provided by the                            same physician or other qualified health care                            professional performing a gastrointestinal                            endoscopic service that sedation supports,                            requiring the presence of an independent trained                            observer to assist in the monitoring of the                            patient's level of consciousness and physiological                            status; initial 15 minutes of intra-service time;                            patient age 74 years or older (additional time may                            be reported with [phone], as appropriate)                           [phone], Moderate sedation services provided by the                            same physician or other qualified health care                            professional performing the diagnostic or                            therapeutic service that the sedation supports,                            requiring the presence of an independent trained                            observer to assist in the monitoring of the                            patient's level of consciousness and physiological                            status;  each additional 15 minutes intraservice                            time (List separately in addition to code for                            primary service) Diagnosis Code(s):        ---  Professional ---                           K64.4, Residual hemorrhoidal skin tags                           K62.5, Hemorrhage of anus and rectum                           K92.2, Gastrointestinal hemorrhage, unspecified                           K63.3, Ulcer of intestine                           K92.1, Melena (includes Hematochezia)                           Z86.010, Personal history of colonic polyps                           Z09, Encounter for follow-up examination after                            completed treatment for conditions other than                            malignant neoplasm CPT copyright 2017 [employer]. All rights reserved. The codes documented in this report are preliminary and upon coder review may  be revised to meet current compliance requirements. Justice Britain, MD 12/14/2017 4:48:10 PM Number of Addenda: 0

## 2017-12-14 NOTE — Telephone Encounter (Signed)
He has cirrhosis and PLT were 81 K and INR was ok in June  I suspect unprepped flex and clipping make sense and if Hgb ok home later today vs tomorrow  Thanks

## 2017-12-14 NOTE — ED Triage Notes (Signed)
Pt presents for evaluation of rectal bleeding starting last night around 10 PM after colonoscopy with polyp removal. States bright red bleeding.

## 2017-12-14 NOTE — ED Provider Notes (Signed)
Hgb 12.5, wants to go home. No further bleeding.  We discussed the GI wanted to observe him but he still wants to go home.  He was counseled on strict return precautions, and bleeding precautions.  He is due for a repeat blood check tomorrow.   Oscar Gambler, MD 12/14/17 [phone]

## 2017-12-14 NOTE — Discharge Instructions (Addendum)
Please follow all instructions given to you by GI.  Please follow-up with their office.  If your symptoms return please seek additional medical care and evaluation.  Today you received medications that may make you sleepy or impair your ability to make decisions.  For the next 24 hours please do not drive, operate heavy machinery, care for a small child with out another adult present, or perform any activities that may cause harm to you or someone else if you were to fall asleep or be impaired.

## 2017-12-14 NOTE — ED Provider Notes (Signed)
Medical screening examination/treatment/procedure(s) were conducted as a shared visit with non-physician practitioner(s) and myself.  I personally evaluated the patient during the encounter.  None Status post colonoscopy with polyp removal yesterday.  He has had 5 episodes of bright red bleeding.  He is referred to the emergency department by GI.  Denies significant pain.  Slight cramping and gas sensation.  Is alert and appropriate.  No acute distress.  Abdomen is soft without guarding.  skin is warm and dry.  Agree with plan of management.   Charlesetta Shanks, MD 12/14/17 [phone]

## 2017-12-14 NOTE — ED Provider Notes (Cosign Needed)
[facility] EMERGENCY DEPARTMENT Provider Note   CSN: [id number] Arrival date & time: 12/14/17  6270     History   Chief Complaint Chief Complaint  Patient presents with  . Rectal Bleeding    HPI Oscar [name] is a 52 y.o. male with a past medical history of diabetes, gastric varices, liver cirrhosis secondary to [place], thrombocytopenia, who had a colonoscopy with polyp removal yesterday.  He presents today for evaluation of 5 episodes of frank bright red blood per rectum.  His wife assist in providing history as does chart.  Wife shows me multiple pictures of the toilet filled with bright red blood.  He called GI who state patient will most likely need a flex sig according to their note to help control bleeding.  Reports some mild abdominal cramping, however denies nausea or vomiting.  Last oral intake was last night.  HPI  Past Medical History:  Diagnosis Date  . Allergy   . Anemia   . Clotting disorder ([place])   . Constipation    related to pain meds and takes Colace daily  . Diabetic gastroparesis associated with type 2 diabetes mellitus ([place]) 10/31/2017  . Esophageal varices ([place])   . Fatty liver   . Gastric varices    takes Propranlol for this  . GERD (gastroesophageal reflux disease)    takes Nexium daily  . GI bleed june 2015  . JJKKXFGH(829.9)    "weekly" (10/18/2013)  . Hematemesis 10/18/2013   hospitalized  . Hepatitis yrs ago   hx of Hep A  . History of colon polyps   . History of staph infection 2014  . Hypertension    takes Atenolol,HCTZ,and Lisinopril daily  . Insomnia    but doesn't take any meds  . Iron deficiency anemia due to chronic blood loss 11/04/2017  . Liver cirrhosis secondary to NASH ([place]) + alcohol use? contribution 02/21/2011  . Neck pain    HNP and radiculpathy  . Neuromuscular disorder ([place])   . Numbness hands and fingers   and tingling  . Obesity   . Osteoarthritis    "hands; elbows; knees" (10/18/2013)  .  Pancytopenia ([place])    but never had a blood transfusion, had to take iron transfusion  sept 2014  . Sleep apnea    sleep study in epic from 2006 but doesn't use a cpap (10/18/2013)  . Thrombocytopenia ([place]) in cirrhosis and portal hypertension 06/25/2016  . Type II diabetes mellitus ([facility])    takes Novolin and Novolog and takes Janumet daily  . Urinary frequency     Patient Active Problem List   Diagnosis Date Noted  . Iron deficiency anemia due to chronic blood loss 11/04/2017  . Diabetic gastroparesis associated with type 2 diabetes mellitus ([place]) 10/31/2017  . Dyspepsia 03/22/2017  . Thrombocytopenia ([place]) in cirrhosis and portal hypertension 06/25/2016  . Portal hypertensive gastropathy ([place])   . Abdominal pain, epigastric 05/10/2016  . Obesity 06/18/2015  . Metabolic syndrome 37/16/9678  . GERD (gastroesophageal reflux disease)   . Secondary esophageal varices without bleeding ([place])   . Cervical herniated disc 05/30/2013  . insulin-dependent diabetes mellitus 04/27/2013  . Habitual alcohol use 02/07/2013  . Other pancytopenia ([place]) 06/15/2012  . Anemia 05/27/2012  . Hypertension 02/21/2011  . Hyperlipidemia 02/21/2011  . Other cirrhosis of liver ([place]) 02/21/2011  . Elevated liver enzymes 02/21/2011    Past Surgical History:  Procedure Laterality Date  . ANTERIOR CERVICAL DECOMP/DISCECTOMY FUSION N/A 05/30/2013   Procedure: Cervical  five-six anterior cervical decompression with interbody prosthesis plating and bonegraft;  Surgeon: Ophelia Charter, MD;  Location: [facility];  Service: Neurosurgery;  Laterality: N/A;  Cervical five-six anterior cervical decompression with interbody prosthesis plating and bonegraft  . COLONOSCOPY WITH PROPOFOL N/A 02/07/2013   Procedure: COLONOSCOPY WITH PROPOFOL;  Surgeon: Arta Silence, MD;  Location: [facility];  Service: Endoscopy;  Laterality: N/A;  . COLONOSCOPY WITH PROPOFOL N/A 03/06/2014   Procedure: COLONOSCOPY WITH PROPOFOL;   Surgeon: Arta Silence, MD;  Location: [facility];  Service: Endoscopy;  Laterality: N/A;  . ESOPHAGOGASTRODUODENOSCOPY N/A 10/19/2013   Procedure: ESOPHAGOGASTRODUODENOSCOPY (EGD);  Surgeon: Jeryl Columbia, MD;  Location[facility] ENDOSCOPY;  Service: Endoscopy;  Laterality: N/A;  . ESOPHAGOGASTRODUODENOSCOPY (EGD) WITH PROPOFOL N/A 02/07/2013   Procedure: ESOPHAGOGASTRODUODENOSCOPY (EGD) WITH PROPOFOL;  Surgeon: Arta Silence, MD;  Location: [facility]OSCOPY;  Service: Endoscopy;  Laterality: N/A;  . ESOPHAGOGASTRODUODENOSCOPY (EGD) WITH PROPOFOL N/A 03/06/2014   Procedure: ESOPHAGOGASTRODUODENOSCOPY (EGD) WITH PROPOFOL;  Surgeon: Arta Silence, MD;  Location: [facility];  Service: Endoscopy;  Laterality: N/A;  . ESOPHAGOGASTRODUODENOSCOPY (EGD) WITH PROPOFOL N/A 06/25/2016   Procedure: ESOPHAGOGASTRODUODENOSCOPY (EGD) WITH PROPOFOL;  Surgeon: Gatha Mayer, MD;  Location: [facility] ENDOSCOPY;  Service: Endoscopy;  Laterality: N/A;  . EUS N/A 03/06/2014   Procedure: ESOPHAGEAL ENDOSCOPIC ULTRASOUND (EUS) RADIAL;  Surgeon: Arta Silence, MD;  Location: [facility];  Service: Endoscopy;  Laterality: N/A;  . INCISION AND DRAINAGE MOUTH  02/2013   " jaw area"  . WISDOM TOOTH EXTRACTION  ~  1997        Home Medications    Prior to Admission medications   Medication Sig Start Date End Date Taking? Authorizing Provider  acetaminophen (TYLENOL) 500 MG tablet Take 500-1,000 mg by mouth every 6 (six) hours as needed for moderate pain or headache.   Yes [provider]  atenolol (TENORMIN) 50 MG tablet TAKE 1 TABLET DAILY Patient taking differently: Take 50 mg by mouth daily.  11/15/17  Yes Baxley, Cresenciano Lick, MD  esomeprazole (NEXIUM) 40 MG capsule Take 1 capsule (40 mg total) by mouth 2 (two) times daily before a meal. Patient taking differently: Take 20 mg by mouth daily.  10/19/13  Yes Reyne Dumas, MD  gabapentin (NEURONTIN) 300 MG capsule TAKE 2 CAPSULES DAILY AT BEDTIME FOR PERIPHERAL  NEUROPATHY Patient taking differently: Take 600 mg by mouth at bedtime. TAKE 2 CAPSULES DAILY AT BEDTIME FOR PERIPHERAL NEUROPATHY 08/29/17  Yes Baxley, Cresenciano Lick, MD  hydrochlorothiazide (HYDRODIURIL) 25 MG tablet TAKE 1 TABLET EVERY MORNING Patient taking differently: Take 25 mg by mouth daily.  12/12/17  Yes Baxley, Cresenciano Lick, MD  JANUMET 50-1000 MG tablet TAKE 1 TABLET TWICE A DAY WITH MEALS Patient taking differently: Take 1 tablet by mouth 2 (two) times daily with a meal.  12/12/17  Yes Baxley, Cresenciano Lick, MD  lisinopril (PRINIVIL,ZESTRIL) 20 MG tablet TAKE 1 TABLET DAILY Patient taking differently: Take 20 mg by mouth daily.  11/29/17  Yes Baxley, Cresenciano Lick, MD  metoCLOPramide (REGLAN) 10 MG tablet Take 1 tablet (10 mg total) by mouth 3 (three) times daily before meals. 10/31/17  Yes Gatha Mayer, MD  naproxen sodium (ALEVE) 220 MG tablet Take 220 mg by mouth as needed.   Yes [provider]  NOVOLOG MIX 70/30 FLEXPEN (70-30) 100 UNIT/ML FlexPen Inject 40 Units into the skin 3 (three) times daily.  10/20/16  Yes [provider]  sodium chloride (OCEAN) 0.65 % SOLN nasal spray Place 2 sprays  into both nostrils daily as needed for congestion.   Yes [provider]    Family History Family History  Problem Relation Age of Onset  . Hypertension Mother   . Heart failure Mother   . Pancreatitis Father   . Kidney cancer Father   . Brain cancer Sister   . Lung cancer Sister   . Kidney disease Brother   . Diabetes Brother   . Colon cancer Neg Hx   . Stomach cancer Neg Hx   . Liver disease Neg Hx   . Esophageal cancer Neg Hx   . Rectal cancer Neg Hx     Social History Social History   Tobacco Use  . Smoking status: Never Smoker  . Smokeless tobacco: Never Used  . Tobacco comment: never used tobacco  Substance Use Topics  . Alcohol use: No    Alcohol/week: 0.0 standard drinks    Comment: quit drinking compeletely in Oct 2014  . Drug use: No     Allergies   [place]  10 mcg pen [exenatide]   Review of Systems Review of Systems  Constitutional: Negative for chills and fever.  HENT: Negative for ear pain and sore throat.   Eyes: Negative for pain and visual disturbance.  Respiratory: Negative for cough and shortness of breath.   Cardiovascular: Negative for chest pain and palpitations.  Gastrointestinal: Positive for abdominal pain, anal bleeding and blood in stool. Negative for nausea and vomiting.  Genitourinary: Negative for dysuria and hematuria.  Musculoskeletal: Negative for arthralgias and back pain.  Skin: Negative for color change and rash.  Neurological: Negative for seizures and syncope.  All other systems reviewed and are negative.    Physical Exam Updated Vital Signs BP 105/74   Pulse (!) 57   Temp 97.7 F (36.5 C) (Oral)   Resp 20   Ht 5\' 8"  (1.727 m)   Wt 130.2 kg   SpO2 98%   BMI 43.64 kg/m   Physical Exam  Constitutional: He appears well-developed and well-nourished. No distress.  HENT:  Head: Normocephalic and atraumatic.  Mouth/Throat: Oropharynx is clear and moist.  Eyes: Conjunctivae are normal.  Neck: Normal range of motion. Neck supple.  Cardiovascular: Normal rate, regular rhythm and intact distal pulses.  No murmur heard. Pulmonary/Chest: Effort normal and breath sounds normal. No respiratory distress.  Abdominal: Soft. Bowel sounds are normal. He exhibits no distension and no mass. There is no tenderness. There is no guarding.  Musculoskeletal: He exhibits no edema.  Neurological: He is alert.  Skin: Skin is warm and dry. He is not diaphoretic.  Psychiatric: He has a normal mood and affect.  Nursing note and vitals reviewed.    ED Treatments / Results  Labs (all labs ordered are listed, but only abnormal results are displayed) Labs Reviewed  COMPREHENSIVE METABOLIC PANEL - Abnormal; Notable for the following components:      Result Value   Glucose, Bld 223 (*)    Albumin 3.4 (*)    Total Bilirubin  1.5 (*)    All other components within normal limits  CBC - Abnormal; Notable for the following components:   Platelets 94 (*)    All other components within normal limits  CBG MONITORING, ED - Abnormal; Notable for the following components:   Glucose-Capillary 209 (*)    All other components within normal limits  PROTIME-INR  APTT  APTT  PROTIME-INR  TYPE AND SCREEN    EKG None  Radiology No results found.  Procedures Procedures (  including critical care time) CRITICAL CARE Performed by: Wyn Quaker Total critical care time: 40 minutes Critical care time was exclusive of separately billable procedures and treating other patients. Critical care was necessary to treat or prevent imminent or life-threatening deterioration. Critical care was time spent personally by me on the following activities: development of treatment plan with patient and/or surrogate as well as nursing, discussions with consultants, evaluation of patient's response to treatment, examination of patient, obtaining history from patient or surrogate, ordering and performing treatments and interventions, ordering and review of laboratory studies, ordering and review of radiographic studies, pulse oximetry and re-evaluation of patient's condition. Bright red per rectum requiring emergent GI procedure.   Medications Ordered in ED Medications      0.9 %  sodium chloride infusion ( Intravenous New Bag/Given 12/14/17 1316)     Initial Impression / Assessment and Plan / ED Course  I have reviewed the triage vital signs and the nursing notes.  Pertinent labs & imaging results that were available during my care of the patient were reviewed by me and considered in my medical decision making (see chart for details).  Clinical Course as of Dec 14 1528  Wed Dec 14, 2017  0925 Spoke with GI who will come see patient.    [EH]    Clinical Course User Index [EH] Lorin Glass, PA-C   Patient presents  today for evaluation of rectal bleeding.  He had a GI with polyp removal yesterday, and since then has had 5 episodes of bright red blood in the toilet.  He consulted GI prior to coming here who plan to do a flex sig to evaluate for bleeding.  His glucose was mildly elevated, however this was not treated his plan to keep patient n.p.o.  He is not anemic, does not have an elevated white count, and remainder of electrolytes are without significant abnormality.  PT/INR and APTT were both normal.  At this time patient is pending treatment by GI.  Remained hemodynamically stable while under my care.  Dispo per GI.    Final Clinical Impressions(s) / ED Diagnoses   Final diagnoses:  Acute GI bleeding  Hematochezia    ED Discharge Orders    None       Lorin Glass, [place] 12/14/17 1534

## 2017-12-14 NOTE — Telephone Encounter (Signed)
Called by patient's wife. Patient had colonoscopy yesterday with polypectomy, hot snare sigmoid 10 mm semi-pedunculated polyp.  Other polyps removed cold and very small 5-6 episodes of hematochezia since 11 PM last night Feeling weak  Advised that he go to the ER immediately for evaluation.  Presumed post polypectomy bleeding GI will be available when he arrives I expect he may need flex sig for control of bleeding Once patient arrives please keep him n.p.o. and notify GI  I will alert our hospital provider Dr. Rush Landmark as well as Dr. Carlean Purl who performed his procedure yesterday

## 2017-12-14 NOTE — ED Notes (Signed)
Endo at bedside

## 2017-12-14 NOTE — Consult Note (Addendum)
[facility] Consult: 9:37 AM 12/14/2017  LOS: 0 days    Referring Provider: Dr. Vallery Ridge in the ED. Primary Care Physician:  Elby Showers, MD Primary Gastroenterologist:  Dr. Carlean Purl, previously Dr. Paulita Fujita    Reason for Consultation: Hematochezia following yesterday's polypectomy   HPI: Oscar [name] is a 52 y.o. male.  PMH obesity.  DM 2 on insulin.     History cirrhosis due to ETOH/NASH, esophageal varices, tubular adenomatous and hyperplastic polyps.  Previous history of post colon polypectomy bleeding.  Splenomegaly.  Portal venous hypertension.   MRI of 11/2016 with lesion, question dysplastic nodule versus small HCC versus vascular shunt. MRI repeated 09/26/2017 showed stable subcentimeter lesion with arterial phase enhancement in segment 4A, suspicious for dysplastic nodule, recommend continued MRI surveillance in 6 to 12 months.  Stable hepatic cirrhosis and portal venous hypertension. Gastric emptying study 10/31/2017 to evaluate epigastric pain, nausea, vomiting.  Revealed delayed gastric emptying consistent with gastroparesis. AFP level 3.2 on 09/14/17.   LFTs normal in general.    Hgbs avg ~ 14 but low Ferritin at 13, but Iron normal, TIBC elevated and iron sat low in 10/2017. Thrombocytopenia ranging in 80s for last 7 months.  No recent coagulopathy.      Multiple prior upper endoscopies and colonoscopies.   Hematemesis, upper GI bleed in 09/2013.  EGD done by Dr. Lizbeth Bark showed tiny distal esophageal varices which flattened out with insufflation, mild portal gastropathy and question antral varices versus edema.  No active bleeding Patient also has history of post polypectomy bleed some years ago, before he was a patient of Dr. Margaree Mackintosh he underwent upper endoscopy and colonoscopy  at the [facility]. EGD for variceal surveillance, intermittent epigastric pain and nausea and vomiting..  Found grade 2 varices in the lower third of the esophagus, 5 mm at largest diameter.  No bleeding stigmata.  Moderate portal hypertensive gastropathy in the stomach with snakeskin changes proximally and erythematous polypoid changes in the antrum. Colonoscopy.  For personal history of adenomatous colon polyps greater than 3 years before.  A 10 mm polyp was removed with hot snare from the proximal sigmoid colon.  3, 1 to 2 mm polyps in the transverse and a sending colon were removed with cold biopsy.  Rectal varices noted.  Several episodes painless hematochezia started ~ 11 PM last night.   There was associated cramping of the lower abdomen which resolved after passing the stools.  The amount of blood, seen on cell phone photo this morning, looks moderate in volume.  The last episode of bleeding was 6 AM today.  Some dizziness, no syncope no chest pain, no palpitations, no nausea or vomiting.  No recent previous history of minor bleeding per rectum.  Patient was advised to proceed to the ED.  Hgb is 14.4 (13.9 6 weeks ago).  Platelets 94.  BUN not elevated.  Coags normal.    Past Medical History:  Diagnosis Date  . Allergy   . Anemia   . Clotting disorder ([place])   . Constipation  related to pain meds and takes Colace daily  . Diabetic gastroparesis associated with type 2 diabetes mellitus ([place]) 10/31/2017  . Esophageal varices ([place])   . Fatty liver   . Gastric varices    takes Propranlol for this  . GERD (gastroesophageal reflux disease)    takes Nexium daily  . GI bleed june 2015  . NLGXQJJH(417.4)    "weekly" (10/18/2013)  . Hematemesis 10/18/2013   hospitalized  . Hepatitis yrs ago   hx of Hep A  . History of colon polyps   . History of staph infection 2014  . Hypertension    takes Atenolol,HCTZ,and Lisinopril daily  . Insomnia    but doesn't take any meds  . Iron deficiency anemia  due to chronic blood loss 11/04/2017  . Liver cirrhosis secondary to NASH ([place]) + alcohol use? contribution 02/21/2011  . Neck pain    HNP and radiculpathy  . Neuromuscular disorder ([place])   . Numbness hands and fingers   and tingling  . Obesity   . Osteoarthritis    "hands; elbows; knees" (10/18/2013)  . Pancytopenia ([place])    but never had a blood transfusion, had to take iron transfusion  sept 2014  . Sleep apnea    sleep study in epic from 2006 but doesn't use a cpap (10/18/2013)  . Thrombocytopenia ([place]) in cirrhosis and portal hypertension 06/25/2016  . Type II diabetes mellitus ([facility])    takes Novolin and Novolog and takes Janumet daily  . Urinary frequency     Past Surgical History:  Procedure Laterality Date  . ANTERIOR CERVICAL DECOMP/DISCECTOMY FUSION N/A 05/30/2013   Procedure: Cervical five-six anterior cervical decompression with interbody prosthesis plating and bonegraft;  Surgeon: Ophelia Charter, MD;  Location: [facility];  Service: Neurosurgery;  Laterality: N/A;  Cervical five-six anterior cervical decompression with interbody prosthesis plating and bonegraft  . COLONOSCOPY WITH PROPOFOL N/A 02/07/2013   Procedure: COLONOSCOPY WITH PROPOFOL;  Surgeon: Arta Silence, MD;  Location: [facility];  Service: Endoscopy;  Laterality: N/A;  . COLONOSCOPY WITH PROPOFOL N/A 03/06/2014   Procedure: COLONOSCOPY WITH PROPOFOL;  Surgeon: Arta Silence, MD;  Location: [facility];  Service: Endoscopy;  Laterality: N/A;  . ESOPHAGOGASTRODUODENOSCOPY N/A 10/19/2013   Procedure: ESOPHAGOGASTRODUODENOSCOPY (EGD);  Surgeon: Jeryl Columbia, MD;  Location: [facility] END[facility]COPY;  Service: Endoscopy;  Laterality: N/A;  . ESOPHAGOGASTRODUODENOSCOPY (EGD) WITH PROPOFOL N/A 02/07/2013   Procedure: ESOPHAGOGASTRODUODENOSCOPY (EGD) WITH PROPOFOL;  Surgeon: Arta Silence, MD;  Location: [facility];  Service: Endoscopy;  Laterality: N/A;  . ESOPHAGOGASTRODUODENOSCOPY (EGD) WITH PROPOFOL N/A 03/06/2014    Procedure: ESOPHAGOGASTRODUODENOSCOPY (EGD) WITH PROPOFOL;  Surgeon: Arta Silence, MD;  Location: [facility];  Service: Endoscopy;  Laterality: N/A;  . ESOPHAGOGASTRODUODENOSCOPY (EGD) WITH PROPOFOL N/A 06/25/2016   Procedure: ESOPHAGOGASTRODUODENOSCOPY (EGD) WITH PROPOFOL;  Surgeon: Gatha Mayer, MD;  Location: [facility];  Service: Endoscopy;  Laterality: N/A;  . EUS N/A 03/06/2014   Procedure: ESOPHAGEAL ENDOSCOPIC ULTRASOUND (EUS) RADIAL;  Surgeon: Arta Silence, MD;  Location: [facility];  Service: Endoscopy;  Laterality: N/A;  . INCISION AND DRAINAGE MOUTH  02/2013   " jaw area"  . WISDOM TOOTH EXTRACTION  ~  1997    Prior to Admission medications   Medication Sig Start Date End Date Taking? Authorizing Provider  acetaminophen (TYLENOL) 500 MG tablet Take 500-1,000 mg by mouth every 6 (six) hours as needed for moderate pain or headache.    [provider]  atenolol (TENORMIN) 50 MG tablet TAKE 1 TABLET DAILY  11/15/17   Elby Showers, MD  esomeprazole (NEXIUM) 40 MG capsule Take 1 capsule (40 mg total) by mouth 2 (two) times daily before a meal. Patient taking differently: Take 20 mg by mouth daily.  10/19/13   Reyne Dumas, MD  gabapentin (NEURONTIN) 300 MG capsule TAKE 2 CAPSULES DAILY AT BEDTIME FOR PERIPHERAL NEUROPATHY 08/29/17   Elby Showers, MD  hydrochlorothiazide (HYDRODIURIL) 25 MG tablet TAKE 1 TABLET EVERY MORNING 12/12/17   Baxley, Cresenciano Lick, MD  JANUMET 50-1000 MG tablet TAKE 1 TABLET TWICE A DAY WITH MEALS 12/12/17   Elby Showers, MD  lisinopril (PRINIVIL,ZESTRIL) 20 MG tablet TAKE 1 TABLET DAILY 11/29/17   Elby Showers, MD  metoCLOPramide (REGLAN) 10 MG tablet Take 1 tablet (10 mg total) by mouth 3 (three) times daily before meals. 10/31/17   Gatha Mayer, MD  naproxen sodium (ALEVE) 220 MG tablet Take 220 mg by mouth as needed.    [provider]  NOVOLOG MIX 70/30 FLEXPEN (70-30) 100 UNIT/ML FlexPen Inject 40 Units into the skin 3 (three) times  daily.  10/20/16   [provider]  sodium chloride (OCEAN) 0.65 % SOLN nasal spray Place 2 sprays into both nostrils daily as needed for congestion.    [provider]    Scheduled Meds:  Infusions:  PRN Meds:    Allergies as of 12/14/2017 - Review Complete 12/14/2017  Allergen Reaction Noted  . Byetta 10 mcg pen [exenatide] Nausea And Vomiting 03/15/2013    Family History  Problem Relation Age of Onset  . Hypertension Mother   . Heart failure Mother   . Pancreatitis Father   . Kidney cancer Father   . Brain cancer Sister   . Lung cancer Sister   . Kidney disease Brother   . Diabetes Brother   . Colon cancer Neg Hx   . Stomach cancer Neg Hx   . Liver disease Neg Hx   . Esophageal cancer Neg Hx   . Rectal cancer Neg Hx     Social History   Socioeconomic History  . Marital status: Married    Spouse name: Not on file  . Number of children: 0  . Years of education: Not on file  . Highest education level: Not on file  Occupational History  . Occupation: Chief Strategy Officer: [employer]  . Financial resource strain: Not on file  . Food insecurity:    Worry: Not on file    Inability: Not on file  . Transportation needs:    Medical: Not on file    Non-medical: Not on file  Tobacco Use  . Smoking status: Never Smoker  . Smokeless tobacco: Never Used  . Tobacco comment: never used tobacco  Substance and Sexual Activity  . Alcohol use: No    Alcohol/week: 0.0 standard drinks    Comment: quit drinking compeletely in Oct 2014  . Drug use: No  . Sexual activity: Not Currently  Lifestyle  . Physical activity:    Days per week: Not on file    Minutes per session: Not on file  . Stress: Not on file  Relationships  . Social connections:    Talks on phone: Not on file    Gets together: Not on file    Attends religious service: Not on file    Active member of club or organization: Not on file    Attends  meetings of clubs or organizations: Not on  file    Relationship status: Not on file  . Intimate partner violence:    Fear of current or ex partner: Not on file    Emotionally abused: Not on file    Physically abused: Not on file    Forced sexual activity: Not on file  Other Topics Concern  . 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  Patient still works. ENT:  No nose bleeds Pulm: No shortness of breath, no cough. CV:  No palpitations, no LE edema.  No chest pain GU:  No hematuria, no frequency GI:  Per HPI Heme: Denies unusual or excessive bruising/bleeding. Transfusions: No transfusions in the epic record. Neuro:  No headaches, no peripheral tingling or numbness Derm:  No itching, no rash or sores.  Endocrine:  No sweats or chills.  No polyuria or dysuria.  Sugars at home tend to be in the low to mid 100s. Immunization: Reviewed. Travel:  None beyond local counties in last few months.    PHYSICAL EXAM: Vital signs in last 24 hours: Vitals:   12/14/17 0835  BP: 130/74  Pulse: 61  Resp: 16  Temp: 97.7 F (36.5 C)  SpO2: 98%   Wt Readings from Last 3 Encounters:  12/14/17 130.2 kg  12/13/17 131.5 kg  11/29/17 127.9 kg    General: Obese, comfortable, alert.  Looks somewhat chronically ill.  Coloring ashen. Head: No signs of head trauma.  No asymmetry or swelling. Eyes: No scleral icterus.  No conjunctival pallor.  EOMI. Ears: Not hard of hearing. Nose: No congestion or discharge Mouth: Nonspecific white coating on the tongue.  Oral mucosa moist and clear.   Neck: No JVD, no TMG, no masses. Lungs: Clear bilaterally.  No labored breathing. Heart: RRR.  No MRG.  S1, S2 present Abdomen: Soft, obese, nontender, nondistended.  Active bowel sounds.  No HSM, masses, bruits, hernias..   Rectal: Deferred rectal  exam. Musc/Skeltl: No joint redness, swelling or significant deformities. Extremities: Slight pretibial pitting edema.  Healed over abrasions on shins bilaterally. Neurologic: Alert.  Oriented x3.  No tremor.  No obvious weakness or deficits. Skin: No telangiectasia.  Scabs on the shins bilaterally. Psych: Pleasant, cooperative.  Fully alert and oriented.  Fluid speech.  Affect normal/does not  Intake/Output from previous day: No intake/output data recorded. Intake/Output this shift: No intake/output data recorded.  LAB RESULTS: Recent Labs    12/14/17 0840  WBC 4.6  HGB 14.4  HCT 40.8  PLT PENDING   BMET Lab Results  Component Value Date   NA 139 11/01/2017   NA 137 09/14/2017   NA 142 05/05/2017   K 4.2 11/01/2017   K 4.0 09/14/2017   K 4.0 05/05/2017   CL 105 11/01/2017   CL 104 09/14/2017   CL 105 05/05/2017   CO2 27 11/01/2017   CO2 28 09/14/2017   CO2 28 05/05/2017   GLUCOSE 137 (H) 11/01/2017   GLUCOSE 262 (H) 09/14/2017   GLUCOSE 217 (H) 05/05/2017   BUN 15 11/01/2017   BUN 15 09/14/2017   BUN 9 05/05/2017   CREATININE 1.13 11/01/2017   CREATININE 0.94 09/14/2017   CREATININE 1.00 05/05/2017   CALCIUM 10.0 11/01/2017   CALCIUM 9.4 09/14/2017   CALCIUM 9.2 05/05/2017   LFT No results for input(s):  PROT, ALBUMIN, AST, ALT, ALKPHOS, BILITOT, BILIDIR, IBILI in the last 72 hours. PT/INR Lab Results  Component Value Date   INR 1.3 (H) 09/14/2017   INR 1.2 (H) 03/22/2017   INR 1.2 (H) 05/10/2016   PROTIME 14.4 (H) 09/20/2014     IMPRESSION:   *    Post polypectomy bleed.  Suspect source is the large sigmoid polyp hot snare removed yesterday.    *   Cirrhosis of liver due to alcohol which he quit in 2004.  *   Thrombocytopenia.  Noncritical.  *   Esophageal varices, portal hypertensive gastropathy, non-bleeding at EGD yesterday.    *   Gastroparesis.  Reglan at home.    PLAN:     *   Flex sig today.  Tap water enema before hand.  *    Depending on how stable the patient is over the next several hours and what transpires at the time of flexible sigmoidoscopy, he may be able to discharge home after sigmoidoscopy or he may need to be admitted for observation overnight.  Alerted the hospitalist that he might need observation admission.  Dr. Lorin Mercy said to contact the hospitalist if he does require admission   Azucena Freed  12/14/2017, 9:37 AM Phone [phone]

## 2017-12-15 ENCOUNTER — Other Ambulatory Visit (INDEPENDENT_AMBULATORY_CARE_PROVIDER_SITE_OTHER): Payer: BLUE CROSS/BLUE SHIELD

## 2017-12-15 ENCOUNTER — Encounter (HOSPITAL_COMMUNITY): Payer: Self-pay | Admitting: Gastroenterology

## 2017-12-15 ENCOUNTER — Telehealth: Payer: Self-pay | Admitting: Internal Medicine

## 2017-12-15 ENCOUNTER — Encounter: Payer: BLUE CROSS/BLUE SHIELD | Admitting: Physical Therapy

## 2017-12-15 DIAGNOSIS — K91840 Postprocedural hemorrhage and hematoma of a digestive system organ or structure following a digestive system procedure: Secondary | ICD-10-CM | POA: Diagnosis not present

## 2017-12-15 LAB — CBC
HCT: 37.7 % — ABNORMAL LOW (ref 39.0–52.0)
HEMOGLOBIN: 13.5 g/dL (ref 13.0–17.0)
MCHC: 35.7 g/dL (ref 30.0–36.0)
MCV: 94.7 fl (ref 78.0–100.0)
PLATELETS: 97 10*3/uL — AB (ref 150.0–400.0)
RBC: 3.98 Mil/uL — ABNORMAL LOW (ref 4.22–5.81)
RDW: 16.6 % — ABNORMAL HIGH (ref 11.5–15.5)
WBC: 4.2 10*3/uL (ref 4.0–10.5)

## 2017-12-15 NOTE — Telephone Encounter (Signed)
Hgb and PLTS ok and no more bleeding after clips placed for post-polypectomy bleed.  I spoke to him  Will observe  Appreciate excellent care from Dr. Rush Landmark.

## 2017-12-18 ENCOUNTER — Encounter: Payer: Self-pay | Admitting: Internal Medicine

## 2017-12-18 DIAGNOSIS — Z860101 Personal history of adenomatous and serrated colon polyps: Secondary | ICD-10-CM

## 2017-12-18 DIAGNOSIS — Z86010 Personal history of colonic polyps: Secondary | ICD-10-CM

## 2017-12-18 HISTORY — DX: Personal history of adenomatous and serrated colon polyps: Z86.0101

## 2017-12-18 HISTORY — DX: Personal history of colonic polyps: Z86.010

## 2017-12-18 NOTE — Progress Notes (Signed)
10 mm sigmoid adenoma 3 diminutive hyperplastic transverse polyps Recall 3 yrs\ My Chart letter

## 2017-12-19 NOTE — Patient Instructions (Signed)
Continue same medications and return in 1 year or as needed

## 2017-12-22 ENCOUNTER — Encounter: Payer: Self-pay | Admitting: Internal Medicine

## 2017-12-22 ENCOUNTER — Ambulatory Visit (INDEPENDENT_AMBULATORY_CARE_PROVIDER_SITE_OTHER): Payer: BLUE CROSS/BLUE SHIELD | Admitting: Internal Medicine

## 2017-12-22 VITALS — BP 120/70 | HR 66 | Temp 98.2°F | Ht 68.0 in | Wt 285.0 lb

## 2017-12-22 DIAGNOSIS — Z794 Long term (current) use of insulin: Secondary | ICD-10-CM

## 2017-12-22 DIAGNOSIS — D696 Thrombocytopenia, unspecified: Secondary | ICD-10-CM

## 2017-12-22 DIAGNOSIS — I8510 Secondary esophageal varices without bleeding: Secondary | ICD-10-CM

## 2017-12-22 DIAGNOSIS — K922 Gastrointestinal hemorrhage, unspecified: Secondary | ICD-10-CM

## 2017-12-22 DIAGNOSIS — E1142 Type 2 diabetes mellitus with diabetic polyneuropathy: Secondary | ICD-10-CM

## 2017-12-22 DIAGNOSIS — E8881 Metabolic syndrome: Secondary | ICD-10-CM

## 2017-12-22 DIAGNOSIS — E7849 Other hyperlipidemia: Secondary | ICD-10-CM | POA: Diagnosis not present

## 2017-12-22 DIAGNOSIS — K7460 Unspecified cirrhosis of liver: Secondary | ICD-10-CM

## 2017-12-22 DIAGNOSIS — I10 Essential (primary) hypertension: Secondary | ICD-10-CM

## 2017-12-22 DIAGNOSIS — E611 Iron deficiency: Secondary | ICD-10-CM

## 2017-12-22 DIAGNOSIS — E119 Type 2 diabetes mellitus without complications: Secondary | ICD-10-CM

## 2017-12-22 DIAGNOSIS — IMO0001 Reserved for inherently not codable concepts without codable children: Secondary | ICD-10-CM

## 2017-12-22 DIAGNOSIS — K7469 Other cirrhosis of liver: Secondary | ICD-10-CM | POA: Diagnosis not present

## 2017-12-22 DIAGNOSIS — K219 Gastro-esophageal reflux disease without esophagitis: Secondary | ICD-10-CM

## 2017-12-22 MED ORDER — NADOLOL 20 MG PO TABS: 20.0000 mg | ORAL_TABLET | Freq: Every day | ORAL | 1 refills | Status: DC

## 2017-12-22 MED ORDER — GABAPENTIN 300 MG PO CAPS: ORAL_CAPSULE | ORAL | 1 refills | Status: DC

## 2017-12-22 NOTE — Patient Instructions (Signed)
Discontinue Tenormin.  Corgard 20 mg daily.  Anemia studies drawn and pending with further recommendations to follow.

## 2017-12-22 NOTE — Progress Notes (Signed)
   Subjective:    Patient ID: Oscar [name], male    DOB: 07/11/1965, 52 y.o.   MRN: [id number]  HPI 52 year old Male seen by Dr. Carlean [name] August 20th for upper GI endoscopy and colonoscopy. Had Grade II varices in lower third of esophagus. Largest diameter was 33m.Moderate portal hypertensive gastropathy noted.  He also had colonoscopy at the same time.  He had a 10 mm polyp in the ascending colon and 3 other flat polyps that were removed.  Subsequently presented to the emergency department the next day with acute GI bleeding.  He has had 5 episodes of bright red blood per rectum.  Hemoglobin was stable but decreased from 14.4 g to 12.5 g over 24 hours after his procedures.  CBC was repeated August 22 and was stable at 13.5 g.  He underwent flexible sigmoidoscopy by Dr. MRush [name] August 21 and hemostasis clips were placed.  The bleeding has subsided.  He has had no further episodes.  Dr. GCarlean Purlwould like him to change from Tenormin to Corgard due to his situation with varices.  Patient has a history of diabetes, gastric varices, cirrhosis of the liver, thrombocytopenia.  Wife says he received an iron infusion by Dr. EMarin [name] July.  Anemia studies were drawn today. Review of Systems He has history of insulin-dependent diabetes mellitus, hypertension, GE reflux, diabetic gastroparesis treated with Reglan, hyperlipidemia, history of alcohol use, obesity, portal hypertension gastropathy, esophageal varices, peripheral neuropathy treated with Neurontin, says that peripheral neuropathy is bothering him quite a bit.  Try increasing gabapentin to 600 mg 3 times a day if tolerated.    Objective:   Physical Exam Skin warm and dry.  Nodes none.  Alert and oriented.  Chest clear.  Cardiac exam regular rate and rhythm.       Assessment & Plan:  Normocytic anemia-recent GI bleed status post colonoscopy.  Anemia studies pending.  Had iron infusion by Dr. EMarin OlpJuly 2019  Esophageal  varices-carefully followed by Dr. GCarlean [name] Portal hypertension gastropathy  Diabetic gastroparesis-treated with Reglan  GE reflux-treated with PPI  Hypertension-treated with lisinopril and HCTZ and is also on Tenormin at present time but will be changed to Corgard as requested by Dr. GCarlean Purldue to varices  Insulin-dependent diabetes mellitus  Recent GI bleed after colonoscopy requiring clip placement during flexible sigmoidoscopy  Remote history of alcohol use  Cirrhosis secondary to NASH and?  Alcohol use  Diabetic peripheral neuropathy-increase gabapentin  Plan: Anemia studies drawn and pending.  He will increase gabapentin to 600 mg 3 times a day if tolerated.  Having more issues with peripheral neuropathy.  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  He will need to be careful with gabapentin since it can cause some drowsiness.  Change Tenormin to Corgard 20 mg daily.  Wife who is a nurse can monitor his blood pressure and pulse.

## 2017-12-23 LAB — COMPLETE METABOLIC PANEL WITH GFR
AG Ratio: 1.4 (calc) (ref 1.0–2.5)
ALBUMIN MSPROF: 3.6 g/dL (ref 3.6–5.1)
ALKALINE PHOSPHATASE (APISO): 91 U/L (ref 40–115)
ALT: 19 U/L (ref 9–46)
AST: 18 U/L (ref 10–35)
BILIRUBIN TOTAL: 1.2 mg/dL (ref 0.2–1.2)
BUN: 14 mg/dL (ref 7–25)
CHLORIDE: 104 mmol/L (ref 98–110)
CO2: 24 mmol/L (ref 20–32)
Calcium: 8.9 mg/dL (ref 8.6–10.3)
Creat: 0.84 mg/dL (ref 0.70–1.33)
GFR, Est African American: 117 mL/min/{1.73_m2} (ref >=60)
GFR, Est Non African American: 101 mL/min/{1.73_m2} (ref >=60)
GLUCOSE: 327 mg/dL — AB (ref 65–99)
Globulin: 2.5 g/dL (calc) (ref 1.9–3.7)
Potassium: 4.2 mmol/L (ref 3.5–5.3)
Sodium: 136 mmol/L (ref 135–146)
Total Protein: 6.1 g/dL (ref 6.1–8.1)

## 2017-12-23 LAB — CBC WITH DIFFERENTIAL/PLATELET
BASOS ABS: 9 {cells}/uL (ref 0–200)
Basophils Relative: 0.3 %
EOS PCT: 2.3 %
Eosinophils Absolute: 71 cells/uL (ref 15–500)
HEMATOCRIT: 35.5 % — AB (ref 38.5–50.0)
Hemoglobin: 12.4 g/dL — ABNORMAL LOW (ref 13.2–17.1)
LYMPHS ABS: 973 {cells}/uL (ref 850–3900)
MCH: 33.8 pg — ABNORMAL HIGH (ref 27.0–33.0)
MCHC: 34.9 g/dL (ref 32.0–36.0)
MCV: 96.7 fL (ref 80.0–100.0)
MPV: 10.3 fL (ref 7.5–12.5)
Monocytes Relative: 7.1 %
NEUTROS ABS: 1826 {cells}/uL (ref 1500–7800)
Neutrophils Relative %: 58.9 %
PLATELETS: 82 10*3/uL — AB (ref 140–400)
RBC: 3.67 10*6/uL — AB (ref 4.20–5.80)
RDW: 15.1 % — AB (ref 11.0–15.0)
Total Lymphocyte: 31.4 %
WBC: 3.1 10*3/uL — ABNORMAL LOW (ref 3.8–10.8)
WBCMIX: 220 {cells}/uL (ref 200–950)

## 2017-12-23 LAB — IRON,TIBC AND FERRITIN PANEL
%SAT: 31 % (calc) (ref 20–48)
FERRITIN: 37 ng/mL — AB (ref 38–380)
IRON: 128 ug/dL (ref 50–180)
TIBC: 419 ug/dL (ref 250–425)

## 2018-02-16 ENCOUNTER — Telehealth: Payer: Self-pay | Admitting: Internal Medicine

## 2018-02-16 NOTE — Telephone Encounter (Signed)
Wife, Barnetta Chapel called requesting the possibility of having intermittent FMLA paperwork completed for patient for HTN, Tumor ([place]) and Gastric issues with Carlean Purl.  Advised that he would need to have paperwork completed separately for each individual issue.  And, I do not believe he would qualify for intermittent FMLA for HTN.  Explained the reasons.  Advised to contact each physician for the other issues for FMLA.  She also wanted to know about FMLA for diabetes.  He sees Dr. Chalmers Cater currently for that, so she should contact her for this.  She wants her husband to come back to Dr. Renold Genta, advised he'll need to talk with Dr. Renold Genta about that on his next visit here before he fires Dr. Chalmers Cater.    Wife verbalized understanding of our conversation.

## 2018-02-18 ENCOUNTER — Other Ambulatory Visit: Payer: Self-pay | Admitting: Internal Medicine

## 2018-03-30 DIAGNOSIS — I10 Essential (primary) hypertension: Secondary | ICD-10-CM | POA: Diagnosis not present

## 2018-03-30 DIAGNOSIS — E7800 Pure hypercholesterolemia, unspecified: Secondary | ICD-10-CM | POA: Diagnosis not present

## 2018-03-30 DIAGNOSIS — K769 Liver disease, unspecified: Secondary | ICD-10-CM | POA: Diagnosis not present

## 2018-03-30 DIAGNOSIS — E1165 Type 2 diabetes mellitus with hyperglycemia: Secondary | ICD-10-CM | POA: Diagnosis not present

## 2018-04-20 ENCOUNTER — Other Ambulatory Visit: Payer: Self-pay | Admitting: Internal Medicine

## 2018-05-04 ENCOUNTER — Inpatient Hospital Stay: Payer: BLUE CROSS/BLUE SHIELD | Admitting: Family

## 2018-05-04 ENCOUNTER — Telehealth: Payer: Self-pay | Admitting: Family

## 2018-05-04 ENCOUNTER — Inpatient Hospital Stay: Payer: BLUE CROSS/BLUE SHIELD

## 2018-05-04 NOTE — Telephone Encounter (Signed)
Returned call to patient re cancelling 1/9 appts per phone message. Spoke with patient wife and she will forward him the message to call the office back to resch appts.

## 2018-05-12 ENCOUNTER — Other Ambulatory Visit: Payer: Self-pay | Admitting: Internal Medicine

## 2018-05-28 ENCOUNTER — Other Ambulatory Visit: Payer: Self-pay | Admitting: Internal Medicine

## 2018-08-07 DIAGNOSIS — E1140 Type 2 diabetes mellitus with diabetic neuropathy, unspecified: Secondary | ICD-10-CM | POA: Diagnosis not present

## 2018-08-07 DIAGNOSIS — K769 Liver disease, unspecified: Secondary | ICD-10-CM | POA: Diagnosis not present

## 2018-08-07 DIAGNOSIS — E7800 Pure hypercholesterolemia, unspecified: Secondary | ICD-10-CM | POA: Diagnosis not present

## 2018-08-07 DIAGNOSIS — E1165 Type 2 diabetes mellitus with hyperglycemia: Secondary | ICD-10-CM | POA: Diagnosis not present

## 2018-08-07 DIAGNOSIS — I10 Essential (primary) hypertension: Secondary | ICD-10-CM | POA: Diagnosis not present

## 2018-10-30 ENCOUNTER — Other Ambulatory Visit: Payer: Self-pay

## 2018-10-30 ENCOUNTER — Telehealth: Payer: Self-pay | Admitting: Internal Medicine

## 2018-10-30 DIAGNOSIS — K769 Liver disease, unspecified: Secondary | ICD-10-CM

## 2018-10-30 NOTE — Telephone Encounter (Signed)
Patient's wife notified of the MRi scheduled for 11/02/18 at [facility]  He is asked to arrive at 3:30 and be NPO after 12:00 noon

## 2018-10-30 NOTE — Telephone Encounter (Signed)
Patient wife called to schedule a MRI per the letter that they received.  Best days that work for him are [employer], Tuesday's and Thursday after 12:00pm

## 2018-11-02 ENCOUNTER — Other Ambulatory Visit: Payer: Self-pay

## 2018-11-02 ENCOUNTER — Ambulatory Visit (HOSPITAL_COMMUNITY)
Admission: RE | Admit: 2018-11-02 | Discharge: 2018-11-02 | Disposition: A | Discharge status: Home / Self Care | Payer: BC Managed Care – PPO | Source: Ambulatory Visit | Attending: Internal Medicine | Admitting: Internal Medicine

## 2018-11-02 ENCOUNTER — Other Ambulatory Visit: Payer: Self-pay | Admitting: Internal Medicine

## 2018-11-02 DIAGNOSIS — K769 Liver disease, unspecified: Secondary | ICD-10-CM

## 2018-11-03 ENCOUNTER — Other Ambulatory Visit: Payer: Self-pay

## 2018-11-03 DIAGNOSIS — K769 Liver disease, unspecified: Secondary | ICD-10-CM

## 2018-11-27 ENCOUNTER — Telehealth: Payer: Self-pay

## 2018-11-27 MED ORDER — METOCLOPRAMIDE HCL 10 MG PO TABS: 10.0000 mg | ORAL_TABLET | Freq: Three times a day (TID) | ORAL | 1 refills | Status: DC

## 2018-11-27 NOTE — Telephone Encounter (Signed)
I called and left him a message to call us back and set up an appointment. I also told him I've sent in a refill for him for his metoclopramide.

## 2018-11-27 NOTE — Telephone Encounter (Signed)
Okay to refill his metoclopramide Sir?

## 2018-11-27 NOTE — Telephone Encounter (Signed)
May refill x 1 (up to 60 days)  I need him to set up a f/u visit also -

## 2018-11-28 ENCOUNTER — Other Ambulatory Visit: Payer: BC Managed Care – PPO

## 2018-11-29 ENCOUNTER — Telehealth: Payer: Self-pay | Admitting: Internal Medicine

## 2018-11-29 MED ORDER — GABAPENTIN 300 MG PO CAPS: 600.0000 mg | ORAL_CAPSULE | Freq: Three times a day (TID) | ORAL | 1 refills | Status: DC

## 2018-11-29 NOTE — Telephone Encounter (Signed)
Received Fax RX request from  Pharmacy - [facility] [address] [facility]  Medication -  gabapentin (NEURONTIN) 300 MG capsule  Last Refill - 10-13-18  Last OV - 12/22/17  Last CPE - 11/29/17   Left Voice message that he needs to call back and schedule CPE.

## 2018-11-30 ENCOUNTER — Ambulatory Visit
Admission: RE | Admit: 2018-11-30 | Discharge: 2018-11-30 | Disposition: A | Discharge status: Home / Self Care | Payer: BC Managed Care – PPO | Source: Ambulatory Visit | Attending: Internal Medicine | Admitting: Internal Medicine

## 2018-11-30 DIAGNOSIS — K7460 Unspecified cirrhosis of liver: Secondary | ICD-10-CM | POA: Diagnosis not present

## 2018-11-30 DIAGNOSIS — K769 Liver disease, unspecified: Secondary | ICD-10-CM

## 2018-11-30 DIAGNOSIS — K766 Portal hypertension: Secondary | ICD-10-CM | POA: Diagnosis not present

## 2018-11-30 DIAGNOSIS — K7689 Other specified diseases of liver: Secondary | ICD-10-CM | POA: Diagnosis not present

## 2018-11-30 MED ORDER — GADOBENATE DIMEGLUMINE 529 MG/ML IV SOLN
20.0000 mL | Freq: Once | INTRAVENOUS | Status: AC | PRN
  Administered 2018-11-30: 20 mL via INTRAVENOUS

## 2018-12-01 NOTE — Progress Notes (Signed)
The liver lesion has not changed = good news Need to recheck it in 1 year so enter reminder to repeat MRI 1 year  Ask him to set up a f/u visit me next available/his convenience

## 2018-12-18 DIAGNOSIS — E1165 Type 2 diabetes mellitus with hyperglycemia: Secondary | ICD-10-CM | POA: Diagnosis not present

## 2018-12-18 DIAGNOSIS — K769 Liver disease, unspecified: Secondary | ICD-10-CM | POA: Diagnosis not present

## 2018-12-18 DIAGNOSIS — I10 Essential (primary) hypertension: Secondary | ICD-10-CM | POA: Diagnosis not present

## 2018-12-18 DIAGNOSIS — E7800 Pure hypercholesterolemia, unspecified: Secondary | ICD-10-CM | POA: Diagnosis not present

## 2018-12-28 ENCOUNTER — Encounter: Payer: Self-pay | Admitting: Internal Medicine

## 2018-12-28 ENCOUNTER — Other Ambulatory Visit (INDEPENDENT_AMBULATORY_CARE_PROVIDER_SITE_OTHER): Payer: BC Managed Care – PPO

## 2018-12-28 ENCOUNTER — Ambulatory Visit (INDEPENDENT_AMBULATORY_CARE_PROVIDER_SITE_OTHER): Payer: BC Managed Care – PPO | Admitting: Internal Medicine

## 2018-12-28 VITALS — BP 110/78 | HR 70 | Temp 97.6°F | Ht 68.0 in | Wt 297.2 lb

## 2018-12-28 DIAGNOSIS — R1031 Right lower quadrant pain: Secondary | ICD-10-CM

## 2018-12-28 DIAGNOSIS — Z86010 Personal history of colonic polyps: Secondary | ICD-10-CM

## 2018-12-28 DIAGNOSIS — K7469 Other cirrhosis of liver: Secondary | ICD-10-CM

## 2018-12-28 DIAGNOSIS — K769 Liver disease, unspecified: Secondary | ICD-10-CM | POA: Insufficient documentation

## 2018-12-28 DIAGNOSIS — D500 Iron deficiency anemia secondary to blood loss (chronic): Secondary | ICD-10-CM

## 2018-12-28 DIAGNOSIS — I8510 Secondary esophageal varices without bleeding: Secondary | ICD-10-CM | POA: Diagnosis not present

## 2018-12-28 LAB — COMPREHENSIVE METABOLIC PANEL
ALT: 21 U/L (ref 0–53)
AST: 24 U/L (ref 0–37)
Albumin: 3.7 g/dL (ref 3.5–5.2)
Alkaline Phosphatase: 77 U/L (ref 39–117)
BUN: 15 mg/dL (ref 6–23)
CO2: 25 mEq/L (ref 19–32)
Calcium: 9.1 mg/dL (ref 8.4–10.5)
Chloride: 106 mEq/L (ref 96–112)
Creatinine, Ser: 0.95 mg/dL (ref 0.40–1.50)
GFR: 83.14 mL/min (ref >60.00)
Glucose, Bld: 121 mg/dL — ABNORMAL HIGH (ref 70–99)
Potassium: 4.2 mEq/L (ref 3.5–5.1)
Sodium: 138 mEq/L (ref 135–145)
Total Bilirubin: 1.5 mg/dL — ABNORMAL HIGH (ref 0.2–1.2)
Total Protein: 7 g/dL (ref 6.0–8.3)

## 2018-12-28 LAB — CBC WITH DIFFERENTIAL/PLATELET
Basophils Absolute: 0 10*3/uL (ref 0.0–0.1)
Basophils Relative: 0.3 % (ref 0.0–3.0)
Eosinophils Absolute: 0.1 10*3/uL (ref 0.0–0.7)
Eosinophils Relative: 2.6 % (ref 0.0–5.0)
HCT: 43.3 % (ref 39.0–52.0)
Hemoglobin: 14.9 g/dL (ref 13.0–17.0)
Lymphocytes Relative: 35.7 % (ref 12.0–46.0)
Lymphs Abs: 1.2 10*3/uL (ref 0.7–4.0)
MCHC: 34.4 g/dL (ref 30.0–36.0)
MCV: 91.5 fl (ref 78.0–100.0)
Monocytes Absolute: 0.3 10*3/uL (ref 0.1–1.0)
Monocytes Relative: 9.5 % (ref 3.0–12.0)
Neutro Abs: 1.7 10*3/uL (ref 1.4–7.7)
Neutrophils Relative %: 51.9 % (ref 43.0–77.0)
Platelets: 108 10*3/uL — ABNORMAL LOW (ref 150.0–400.0)
RBC: 4.74 Mil/uL (ref 4.22–5.81)
RDW: 15.9 % — ABNORMAL HIGH (ref 11.5–15.5)
WBC: 3.3 10*3/uL — ABNORMAL LOW (ref 4.0–10.5)

## 2018-12-28 LAB — PROTIME-INR
INR: 1.2 ratio — ABNORMAL HIGH (ref 0.8–1.0)
Prothrombin Time: 14.2 s — ABNORMAL HIGH (ref 9.6–13.1)

## 2018-12-28 LAB — FERRITIN: Ferritin: 13.1 ng/mL — ABNORMAL LOW (ref 22.0–322.0)

## 2018-12-28 NOTE — Assessment & Plan Note (Addendum)
Stable overall - no signs of deterioriation Lab recheck Lab Results  Component Value Date   WBC 3.3 (L) 12/28/2018   HGB 14.9 12/28/2018   HCT 43.3 12/28/2018   MCV 91.5 12/28/2018   PLT 108.0 (L) 12/28/2018   Lab Results  Component Value Date   INR 1.2 (H) 12/28/2018   INR 1.25 12/14/2017   INR 1.16 12/14/2017   PROTIME 14.4 (H) 09/20/2014   Lab Results  Component Value Date   CREATININE 0.95 12/28/2018   BUN 15 12/28/2018   NA 138 12/28/2018   K 4.2 12/28/2018   CL 106 12/28/2018   CO2 25 12/28/2018   Lab Results  Component Value Date   ALT 21 12/28/2018   AST 24 12/28/2018   ALKPHOS 77 12/28/2018   BILITOT 1.5 (H) 12/28/2018

## 2018-12-28 NOTE — Progress Notes (Signed)
Oscar [name] 53 y.o. 09/03/1965 [id number]  Assessment & Plan:  NASH/ASH cirrhosis Stable overall - no signs of deterioriation Lab recheck Lab Results  Component Value Date   WBC 3.3 (L) 12/28/2018   HGB 14.9 12/28/2018   HCT 43.3 12/28/2018   MCV 91.5 12/28/2018   PLT 108.0 (L) 12/28/2018   Lab Results  Component Value Date   INR 1.2 (H) 12/28/2018   INR 1.25 12/14/2017   INR 1.16 12/14/2017   PROTIME 14.4 (H) 09/20/2014   Lab Results  Component Value Date   CREATININE 0.95 12/28/2018   BUN 15 12/28/2018   NA 138 12/28/2018   K 4.2 12/28/2018   CL 106 12/28/2018   CO2 25 12/28/2018   Lab Results  Component Value Date   ALT 21 12/28/2018   AST 24 12/28/2018   ALKPHOS 77 12/28/2018   BILITOT 1.5 (H) 12/28/2018       Secondary esophageal varices without bleeding (HCC) On Nadolol Surveillance not needed  Iron deficiency anemia due to chronic blood loss Lab Results  Component Value Date   FERRITIN 13.1 (L) 12/28/2018    Hgb NL but will need iron supps  Liver lesion, left lobe Stable Repeat MRI shows stale lesion Repeat in 1 year Check AFP  Obesity, morbid, BMI 40.0-49.9 (HCC) Weight is up - could be from insulin and dm Sounds like DM better controlled     YC:[id number], Cresenciano Lick, MD [name], MD Subjective:    Chief Complaint: f/u cirrhosis  HPI Occasional stomach discomfort  Lower abd pain R groin area in past few mos, not related to stools, eating   Hgb A1 C 7 at Dr. 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 Byetta 10 Mcg Pen [Exenatide] Nausea And Vomiting   Current Meds  Medication Sig   . acetaminophen (TYLENOL) 500 MG tablet Take 500-1,000 mg by mouth every 6 (six) hours as needed for moderate pain or headache.  . esomeprazole (NEXIUM) 40 MG capsule Take 1 capsule (40 mg total) by mouth 2 (two) times daily before a meal. (Patient taking differently: Take 20 mg by mouth daily. )  . gabapentin (NEURONTIN) 300 MG capsule Take 2 capsules (600 mg total) by mouth 3 (three) times daily.  . hydrochlorothiazide (HYDRODIURIL) 25 MG tablet TAKE 1 TABLET EVERY MORNING (Patient taking differently: Take 25 mg by mouth daily. )  . JANUMET 50-1000 MG tablet TAKE 1 TABLET TWICE A DAY WITH MEALS (Patient taking differently: Take 1 tablet by mouth 2 (two) times daily with a meal. )  . lisinopril (PRINIVIL,ZESTRIL) 20 MG tablet TAKE 1 TABLET DAILY  . metoCLOPramide (REGLAN) 10 MG tablet Take 1 tablet (10 mg total) by mouth 3 (three) times daily before meals.  . nadolol (CORGARD) 20 MG tablet TAKE 1 TABLET BY MOUTH EVERY DAY  . naproxen sodium (ALEVE) 220 MG tablet Take 220 mg by mouth as needed.  Marland Kitchen NOVOLOG MIX 70/30 FLEXPEN (70-30) 100 UNIT/ML FlexPen Inject 40 Units into the skin 3 (three) times daily.   . sodium chloride (OCEAN) 0.65 % SOLN nasal spray Place 2 sprays into both  nostrils daily as needed for congestion.   Past Medical History:  Diagnosis Date  . Allergy   . Anemia   . Clotting disorder ([place])   . Constipation    related to pain meds and takes Colace daily  . Diabetic gastroparesis associated with type 2 diabetes mellitus ([place]) 10/31/2017  . Esophageal varices ([place])   . Fatty liver   . Gastric varices    takes Propranlol for this  . GERD (gastroesophageal reflux disease)    takes Nexium daily  . GI bleed june 2015  . OTLXBWIO(035.5)    "weekly" (10/18/2013)  . Hematemesis 10/18/2013   hospitalized  . Hepatitis yrs ago   hx of Hep A  . History of colon polyps   . History of staph infection 2014  . Hx of adenomatous colonic polyps 12/18/2017  . Hypertension    takes  Atenolol,HCTZ,and Lisinopril daily  . Insomnia    but doesn't take any meds  . Iron deficiency anemia due to chronic blood loss 11/04/2017  . Liver cirrhosis secondary to NASH ([place]) + alcohol use? contribution 02/21/2011  . Neck pain    HNP and radiculpathy  . Neuromuscular disorder ([place])   . Numbness hands and fingers   and tingling  . Obesity   . Osteoarthritis    "hands; elbows; knees" (10/18/2013)  . Pancytopenia ([place])    but never had a blood transfusion, had to take iron transfusion  sept 2014  . Sleep apnea    sleep study in epic from 2006 but doesn't use a cpap (10/18/2013)  . Thrombocytopenia ([place]) in cirrhosis and portal hypertension 06/25/2016  . Type II diabetes mellitus (HCC)    takes Novolin and Novolog and takes Janumet daily  . Urinary frequency    Past Surgical History:  Procedure Laterality Date  . ANTERIOR CERVICAL DECOMP/DISCECTOMY FUSION N/A 05/30/2013   Procedure: Cervical five-six anterior cervical decompression with interbody prosthesis plating and bonegraft;  Surgeon: Ophelia Charter, MD;  Location: [place];  Service: Neurosurgery;  Laterality: N/A;  Cervical five-six anterior cervical decompression with interbody prosthesis plating and bonegraft  . COLONOSCOPY WITH PROPOFOL N/A 02/07/2013   Procedure: COLONOSCOPY WITH PROPOFOL;  Surgeon: Arta Silence, MD;  Location: [facility] ENDOSCOPY;  Service: Endoscopy;  Laterality: N/A;  . COLONOSCOPY WITH PROPOFOL N/A 03/06/2014   Procedure: COLONOSCOPY WITH PROPOFOL;  Surgeon: Arta Silence, MD;  Location: [facility] ENDOSCOPY;  Service: Endoscopy;  Laterality: N/A;  . ESOPHAGOGASTRODUODENOSCOPY N/A 10/19/2013   Procedure: ESOPHAGOGASTRODUODENOSCOPY (EGD);  Surgeon: Jeryl Columbia, MD;  [facility];  Service: Endoscopy;  Laterality: N/A;  . ESOPHAGOGASTRODUODENOSCOPY (EGD) WITH PROPOFOL N/A 02/07/2013   Procedure: ESOPHAGOGASTRODUODENOSCOPY (EGD) WITH PROPOFOL;  Surgeon: Arta Silence, MD;  Location: [facility] ENDOSCOPY;   Service: Endoscopy;  Laterality: N/A;  . ESOPHAGOGASTRODUODENOSCOPY (EGD) WITH PROPOFOL N/A 03/06/2014   Procedure: ESOPHAGOGASTRODUODENOSCOPY (EGD) WITH PROPOFOL;  Surgeon: Arta Silence, MD;  Location: [facility] ENDOSCOPY;  Service: Endoscopy;  Laterality: N/A;  . ESOPHAGOGASTRODUODENOSCOPY (EGD) WITH PROPOFOL N/A 06/25/2016   Procedure: ESOPHAGOGASTRODUODENOSCOPY (EGD) WITH PROPOFOL;  Surgeon: Gatha Mayer, MD;  Location: [facility] ENDOSCOPY;  Service: Endoscopy;  Laterality: N/A;  . EUS N/A 03/06/2014   Procedure: ESOPHAGEAL ENDOSCOPIC ULTRASOUND (EUS) RADIAL;  Surgeon: Arta Silence, MD;  Location: [facility] ENDOSCOPY;  Service: Endoscopy;  Laterality: N/A;  . FLEXIBLE SIGMOIDOSCOPY N/A 12/14/2017   Procedure: FLEXIBLE SIGMOIDOSCOPY ;  Surgeon: Irving Copas., MD;  Location: [place];  Service: Gastroenterology;  Laterality: N/A;  . HOT HEMOSTASIS N/A 12/14/2017   Procedure:  HEMOSTASIS CLIPS;  Surgeon: Mansouraty, Telford Nab., MD;  Location: [place];  Service: Gastroenterology;  Laterality: N/A;  . INCISION AND DRAINAGE MOUTH  02/2013   " jaw area"  . 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   Review of Systems Left ankle injury, rolled - recurrent issue  Some swelling  Objective:   Physical Exam BP 110/78 (BP Location: Left Arm, Patient Position: Sitting, Cuff Size: Large)   Pulse 70   Temp 97.6 F (36.4 C) (Oral)   Ht 5' 8"  (1.727 m)   Wt 297 lb 4 oz (134.8 kg)   BMI 45.20 kg/m  @BP  110/78 (BP Location: Left Arm, Patient Position: Sitting, Cuff Size: Large)   Pulse 70   Temp 97.6  F (36.4 C) (Oral)   Ht 5' 8"  (1.727 m)   Wt 297 lb 4 oz (134.8 kg)   BMI 45.20 kg/m @  General:  Well-developed, well-nourished and in no acute distress Eyes:  anicteric. Lungs: Clear to auscultation bilaterally. Heart:  S1S2, no rubs, murmurs, gallops. Abdomen:  OBESE soft, mildly-tender RLQ w/ sl increased bulge no clear hernia, below large panniculus and BS+. Neg [place] Extremities:   Trace bilat LE pretiial edema, no cyanosis or clubbing Skin   no rash. Neuro:  A&O x 3.  Psych:  appropriate mood and  Affect.   Data Reviewed: As per HPI

## 2018-12-28 NOTE — Assessment & Plan Note (Signed)
On Nadolol Surveillance not needed

## 2018-12-28 NOTE — Assessment & Plan Note (Addendum)
Lab Results  Component Value Date   FERRITIN 13.1 (L) 12/28/2018    Hgb NL but will need iron supps

## 2018-12-28 NOTE — Patient Instructions (Signed)
  Your provider has requested that you go to the basement level for lab work before leaving today. Press "B" on the elevator. The lab is located at the first door on the left as you exit the elevator.    I appreciate the opportunity to care for you. Carl Gessner, MD, FACG 

## 2018-12-29 DIAGNOSIS — R1031 Right lower quadrant pain: Secondary | ICD-10-CM | POA: Insufficient documentation

## 2018-12-29 LAB — AFP TUMOR MARKER: AFP-Tumor Marker: 4.8 ng/mL (ref <6.1)

## 2018-12-29 NOTE — Assessment & Plan Note (Signed)
Stable Repeat MRI shows stale lesion Repeat in 1 year Check AFP

## 2018-12-29 NOTE — Assessment & Plan Note (Signed)
Weight is up - could be from insulin and dm Sounds like DM better controlled

## 2019-01-02 ENCOUNTER — Other Ambulatory Visit: Payer: Self-pay | Admitting: Internal Medicine

## 2019-01-02 DIAGNOSIS — D500 Iron deficiency anemia secondary to blood loss (chronic): Secondary | ICD-10-CM

## 2019-01-02 DIAGNOSIS — K7469 Other cirrhosis of liver: Secondary | ICD-10-CM

## 2019-01-02 NOTE — Progress Notes (Signed)
Lawerence,  Labs are good overall though some abnormalities I think liver is working well.  Iron level is low (ferritin) and I want you to purchase Ferrous sulfate 325 mg - its over the counter, and take 1 pill a day with a meal.  It may make stools dark - like coal.  Please put in your calendar to repeat labs with me - in my lab on/around December 1  Best regards,  Gatha Mayer, MD, [facility]

## 2019-02-09 ENCOUNTER — Other Ambulatory Visit: Payer: BC Managed Care – PPO | Admitting: Internal Medicine

## 2019-02-12 ENCOUNTER — Other Ambulatory Visit: Payer: BC Managed Care – PPO | Admitting: Internal Medicine

## 2019-02-13 ENCOUNTER — Other Ambulatory Visit: Payer: BC Managed Care – PPO | Admitting: Internal Medicine

## 2019-02-15 ENCOUNTER — Encounter: Payer: Self-pay | Admitting: Internal Medicine

## 2019-02-15 ENCOUNTER — Other Ambulatory Visit: Payer: BC Managed Care – PPO | Admitting: Internal Medicine

## 2019-02-15 ENCOUNTER — Other Ambulatory Visit: Payer: Self-pay

## 2019-02-15 DIAGNOSIS — E8881 Metabolic syndrome: Secondary | ICD-10-CM | POA: Diagnosis not present

## 2019-02-15 DIAGNOSIS — I10 Essential (primary) hypertension: Secondary | ICD-10-CM | POA: Diagnosis not present

## 2019-02-15 DIAGNOSIS — Z0000 Encounter for general adult medical examination without abnormal findings: Secondary | ICD-10-CM | POA: Diagnosis not present

## 2019-02-15 DIAGNOSIS — E7849 Other hyperlipidemia: Secondary | ICD-10-CM

## 2019-02-15 DIAGNOSIS — E611 Iron deficiency: Secondary | ICD-10-CM

## 2019-02-15 DIAGNOSIS — E1142 Type 2 diabetes mellitus with diabetic polyneuropathy: Secondary | ICD-10-CM | POA: Diagnosis not present

## 2019-02-15 DIAGNOSIS — K219 Gastro-esophageal reflux disease without esophagitis: Secondary | ICD-10-CM

## 2019-02-16 ENCOUNTER — Encounter: Payer: Self-pay | Admitting: Internal Medicine

## 2019-02-16 ENCOUNTER — Ambulatory Visit (INDEPENDENT_AMBULATORY_CARE_PROVIDER_SITE_OTHER): Payer: BC Managed Care – PPO | Admitting: Internal Medicine

## 2019-02-16 VITALS — BP 100/60 | HR 69 | Temp 98.4°F | Ht 68.0 in | Wt 294.0 lb

## 2019-02-16 DIAGNOSIS — Z23 Encounter for immunization: Secondary | ICD-10-CM

## 2019-02-16 DIAGNOSIS — K7030 Alcoholic cirrhosis of liver without ascites: Secondary | ICD-10-CM

## 2019-02-16 DIAGNOSIS — D696 Thrombocytopenia, unspecified: Secondary | ICD-10-CM | POA: Diagnosis not present

## 2019-02-16 DIAGNOSIS — Z6841 Body Mass Index (BMI) 40.0 and over, adult: Secondary | ICD-10-CM

## 2019-02-16 DIAGNOSIS — E1142 Type 2 diabetes mellitus with diabetic polyneuropathy: Secondary | ICD-10-CM

## 2019-02-16 DIAGNOSIS — E109 Type 1 diabetes mellitus without complications: Secondary | ICD-10-CM | POA: Diagnosis not present

## 2019-02-16 DIAGNOSIS — I8510 Secondary esophageal varices without bleeding: Secondary | ICD-10-CM

## 2019-02-16 DIAGNOSIS — K7460 Unspecified cirrhosis of liver: Secondary | ICD-10-CM | POA: Diagnosis not present

## 2019-02-16 DIAGNOSIS — K219 Gastro-esophageal reflux disease without esophagitis: Secondary | ICD-10-CM

## 2019-02-16 DIAGNOSIS — Z8639 Personal history of other endocrine, nutritional and metabolic disease: Secondary | ICD-10-CM

## 2019-02-16 DIAGNOSIS — Z8719 Personal history of other diseases of the digestive system: Secondary | ICD-10-CM

## 2019-02-16 DIAGNOSIS — I10 Essential (primary) hypertension: Secondary | ICD-10-CM

## 2019-02-16 DIAGNOSIS — D708 Other neutropenia: Secondary | ICD-10-CM

## 2019-02-16 DIAGNOSIS — Z0000 Encounter for general adult medical examination without abnormal findings: Secondary | ICD-10-CM | POA: Diagnosis not present

## 2019-02-16 DIAGNOSIS — Z86010 Personal history of colonic polyps: Secondary | ICD-10-CM

## 2019-02-16 DIAGNOSIS — M722 Plantar fascial fibromatosis: Secondary | ICD-10-CM

## 2019-02-16 LAB — POCT URINALYSIS DIPSTICK
Appearance: NEGATIVE
Bilirubin, UA: NEGATIVE
Blood, UA: NEGATIVE
Glucose, UA: POSITIVE — AB
Ketones, UA: NEGATIVE
Leukocytes, UA: NEGATIVE
Nitrite, UA: NEGATIVE
Odor: NEGATIVE
Protein, UA: NEGATIVE
Spec Grav, UA: 1.01 (ref 1.010–1.025)
Urobilinogen, UA: 0.2 E.U./dL
pH, UA: 6 (ref 5.0–8.0)

## 2019-02-16 NOTE — Progress Notes (Signed)
Subjective:    Patient ID: Oscar [name], male    DOB: 06/01/1965, 53 y.o.   MRN: [id number]  HPI 53 year old Male for health maintenance exam and evaluation of medical issues.  He is accompanied by his wife  Has been bothered recently with left heel plantar fasciitis.  May benefit from seeing podiatrist.  He has a history of many medical issues including hypertension, insulin-dependent diabetes mellitus which is treated by Oscar [name], endocrinologist.  History of cirrhosis and esophageal varices.  History of leukopenia and thrombocyptopenia.  Has seen Oscar [name].  History of obstructive sleep apnea, morbid obesity, GE reflux.  Multiple admissions in the past for GI bleeding but none recently.  Had endoscopy in 2018 for esophageal varices monitoring.  He had no varices in the proximal stomach.  He had grade 2 less than 5 mm esophageal varices.  Colonoscopy was performed and one polyp removed.  Post polypectomy he had bleeding due to liver disease.  Hemostasis was achieved after placement of clips.  These polyps were tubular adenomas and hyperplastic polyps with 3  year follow-up recommended.  Social history: He is married.  Says he no longer drinks alcohol or uses drugs.  Non-smoker.  No children.  He used to be a Administrator but cannot o job now because he has insulin-dependent diabetes.  He continues to work in a warehouse.  He is on his feet a lot that may be aggravating plantar fasciitis.  In 2015 he had a C5-C6 anterior discectomy by Oscar [name].  Gastroenterologist is Oscar [name].  History of sleep apnea treated by Oscar [name] in 2006 with CPAP but currently not using that.  Intolerant of by 8 oh-causes nausea and vomiting.  He used to see Oscar [name] for eye exam but now sees a physician in [facility] who is on his insurance plan.  No known drug allergies.  Takes Neurontin for peripheral neuropathy, takes PPI.  He is insulin-dependent and says his diabetes is under fairly good  control.  Family history: Father died from complications of surgery for kidney cancer.  Mother with history of hypertension deceased.  No siblings.  One brother has had a kidney transplant.  1 brother with history of lymphoma.  Wife reminds me he has been diagnosed with NASH.  Flu vaccine given today    Review of Systems no new complaints     Objective:   Physical Exam BP 100/60, Pulse 69, T 98.22F, weight 294 pounds.  BMI 44.70 Skin warm and dry.  Nodes none.  Tender left heel mid plantar aspect.  TMs clear.  Neck is supple without JVD thyromegaly or carotid bruits.  Chest clear.  Cardiac exam regular rate and rhythm normal S1 and S2 without murmurs or gallops.  Abdomen obese soft nondistended without palpable hepatosplenomegaly masses or tenderness.  No pitting edema of the lower extremities.  Prostate is normal.  Neuro no gross focal deficits on brief neurological exam.  He is alert and oriented x3.         Assessment & Plan:  Essential hypertension-stable.  History of insulin-dependent diabetes mellitus  History of cirrhosis  Esophageal varices  Thrombocytopenia and leukopenia related to history of alcohol abuse followed by hematologist  GE reflux treated with PPI  History of gastroparesis likely related to diabetes and has been tried on Reglan  History of fatty liver  Morbid obesity  Multiple admissions for GI bleeding  History of alcohol abuse but not drinking at present time  Plan: Continue current medications and follow-up in 1 year or as needed.  Flu vaccine given.  Recommend annual diabetic eye exam.  Hemoglobin A1c 7.8%.  PSA is normal.  White blood cell count 3200 with platelet count 110,000.  Hemoglobin stable at 14.8 g with normal MCV.  Lipid panel is normal.

## 2019-02-17 LAB — IRON,TIBC AND FERRITIN PANEL
%SAT: 26 % (calc) (ref 20–48)
Ferritin: 14 ng/mL — ABNORMAL LOW (ref 38–380)
Iron: 134 ug/dL (ref 50–180)
TIBC: 512 mcg/dL (calc) — ABNORMAL HIGH (ref 250–425)

## 2019-02-17 LAB — COMPLETE METABOLIC PANEL WITH GFR
AG Ratio: 1.3 (calc) (ref 1.0–2.5)
ALT: 22 U/L (ref 9–46)
AST: 28 U/L (ref 10–35)
Albumin: 3.9 g/dL (ref 3.6–5.1)
Alkaline phosphatase (APISO): 74 U/L (ref 35–144)
BUN: 15 mg/dL (ref 7–25)
CO2: 23 mmol/L (ref 20–32)
Calcium: 9.5 mg/dL (ref 8.6–10.3)
Chloride: 105 mmol/L (ref 98–110)
Creat: 0.88 mg/dL (ref 0.70–1.33)
GFR, Est African American: 114 mL/min/{1.73_m2} (ref >=60)
GFR, Est Non African American: 99 mL/min/{1.73_m2} (ref >=60)
Globulin: 3 g/dL (calc) (ref 1.9–3.7)
Glucose, Bld: 135 mg/dL — ABNORMAL HIGH (ref 65–99)
Potassium: 4.1 mmol/L (ref 3.5–5.3)
Sodium: 138 mmol/L (ref 135–146)
Total Bilirubin: 1.2 mg/dL (ref 0.2–1.2)
Total Protein: 6.9 g/dL (ref 6.1–8.1)

## 2019-02-17 LAB — CBC WITH DIFFERENTIAL/PLATELET
Absolute Monocytes: 278 cells/uL (ref 200–950)
Basophils Absolute: 10 cells/uL (ref 0–200)
Basophils Relative: 0.3 %
Eosinophils Absolute: 80 cells/uL (ref 15–500)
Eosinophils Relative: 2.5 %
HCT: 42.8 % (ref 38.5–50.0)
Hemoglobin: 14.8 g/dL (ref 13.2–17.1)
Lymphs Abs: 1005 cells/uL (ref 850–3900)
MCH: 30.9 pg (ref 27.0–33.0)
MCHC: 34.6 g/dL (ref 32.0–36.0)
MCV: 89.4 fL (ref 80.0–100.0)
MPV: 11.1 fL (ref 7.5–12.5)
Monocytes Relative: 8.7 %
Neutro Abs: 1827 cells/uL (ref 1500–7800)
Neutrophils Relative %: 57.1 %
Platelets: 110 10*3/uL — ABNORMAL LOW (ref 140–400)
RBC: 4.79 10*6/uL (ref 4.20–5.80)
RDW: 14.6 % (ref 11.0–15.0)
Total Lymphocyte: 31.4 %
WBC: 3.2 10*3/uL — ABNORMAL LOW (ref 3.8–10.8)

## 2019-02-17 LAB — TEST AUTHORIZATION 2

## 2019-02-17 LAB — LIPID PANEL
Cholesterol: 144 mg/dL (ref <200)
HDL: 45 mg/dL (ref >=40)
LDL Cholesterol (Calc): 80 mg/dL (calc)
Non-HDL Cholesterol (Calc): 99 mg/dL (calc) (ref <130)
Total CHOL/HDL Ratio: 3.2 (calc) (ref <5.0)
Triglycerides: 108 mg/dL (ref <150)

## 2019-02-17 LAB — TEST AUTHORIZATION

## 2019-02-17 LAB — PSA: PSA: 0.3 ng/mL (ref <=4.0)

## 2019-02-17 LAB — HEMOGLOBIN A1C W/OUT EAG: Hgb A1c MFr Bld: 7.8 % of total Hgb — ABNORMAL HIGH (ref <5.7)

## 2019-02-24 NOTE — Patient Instructions (Signed)
It was a pleasure to see you today.  Continue to watch diet.  Try to exercise and lose some weight.  Follow-up in 1 year or as needed.  Flu vaccine given.  Seeing podiatrist regarding left plantar fasciitis.

## 2019-03-07 ENCOUNTER — Other Ambulatory Visit: Payer: Self-pay | Admitting: Internal Medicine

## 2019-04-30 ENCOUNTER — Other Ambulatory Visit: Payer: Self-pay

## 2019-04-30 MED ORDER — NADOLOL 20 MG PO TABS: 20.0000 mg | ORAL_TABLET | Freq: Every day | ORAL | 3 refills | Status: DC

## 2019-05-23 ENCOUNTER — Other Ambulatory Visit: Payer: Self-pay | Admitting: Internal Medicine

## 2019-05-29 ENCOUNTER — Other Ambulatory Visit: Payer: Self-pay | Admitting: Internal Medicine

## 2019-05-31 DIAGNOSIS — H524 Presbyopia: Secondary | ICD-10-CM | POA: Diagnosis not present

## 2019-05-31 DIAGNOSIS — H2513 Age-related nuclear cataract, bilateral: Secondary | ICD-10-CM | POA: Diagnosis not present

## 2019-05-31 DIAGNOSIS — Z794 Long term (current) use of insulin: Secondary | ICD-10-CM | POA: Diagnosis not present

## 2019-05-31 DIAGNOSIS — H52203 Unspecified astigmatism, bilateral: Secondary | ICD-10-CM | POA: Diagnosis not present

## 2019-05-31 DIAGNOSIS — E119 Type 2 diabetes mellitus without complications: Secondary | ICD-10-CM | POA: Diagnosis not present

## 2019-05-31 DIAGNOSIS — H25013 Cortical age-related cataract, bilateral: Secondary | ICD-10-CM | POA: Diagnosis not present

## 2019-05-31 DIAGNOSIS — H5203 Hypermetropia, bilateral: Secondary | ICD-10-CM | POA: Diagnosis not present

## 2019-06-18 DIAGNOSIS — E7800 Pure hypercholesterolemia, unspecified: Secondary | ICD-10-CM | POA: Diagnosis not present

## 2019-06-18 DIAGNOSIS — K769 Liver disease, unspecified: Secondary | ICD-10-CM | POA: Diagnosis not present

## 2019-06-18 DIAGNOSIS — I10 Essential (primary) hypertension: Secondary | ICD-10-CM | POA: Diagnosis not present

## 2019-06-18 DIAGNOSIS — E1165 Type 2 diabetes mellitus with hyperglycemia: Secondary | ICD-10-CM | POA: Diagnosis not present

## 2019-07-30 ENCOUNTER — Other Ambulatory Visit: Payer: Self-pay

## 2019-07-30 ENCOUNTER — Ambulatory Visit (HOSPITAL_COMMUNITY)
Admission: EM | Admit: 2019-07-30 | Discharge: 2019-07-30 | Disposition: A | Discharge status: Home / Self Care | Payer: BC Managed Care – PPO | Attending: Family Medicine | Admitting: Family Medicine

## 2019-07-30 ENCOUNTER — Encounter (HOSPITAL_COMMUNITY): Payer: Self-pay

## 2019-07-30 DIAGNOSIS — M5431 Sciatica, right side: Secondary | ICD-10-CM

## 2019-07-30 MED ORDER — HYDROCODONE-ACETAMINOPHEN 7.5-325 MG PO TABS: 1.0000 | ORAL_TABLET | Freq: Four times a day (QID) | ORAL | 0 refills | Status: DC | PRN

## 2019-07-30 MED ORDER — PREDNISONE 10 MG (21) PO TBPK: ORAL_TABLET | Freq: Every day | ORAL | 0 refills | Status: DC

## 2019-07-30 MED ORDER — TIZANIDINE HCL 4 MG PO TABS: 4.0000 mg | ORAL_TABLET | Freq: Four times a day (QID) | ORAL | 0 refills | Status: DC | PRN

## 2019-07-30 NOTE — ED Provider Notes (Signed)
Oscar [name]    CSN: [id number] Arrival date & time: 07/30/19  0935      History   Chief Complaint Chief Complaint  Patient presents with  . Back Pain    HPI Oscar [name] is a 54 y.o. male.   HPI    Here for back pain Works as a Programmer, systems Has low back pain periodically Today has increased back pain with pain radiating down the right leg to the knee No weakness No bowel or bladder complaint No injury or overuse It started a couple of hours after work on Friday worked a usual shift Has not taken anything for pain Will need a work note Is an insulin dep diabetic Last A1c was 7.8, is usually well controlled Normal kidney function Has esophageal and gastric varices Is advised not to take the OTC advil or aleve    Past Medical History:  Diagnosis Date  . Allergy   . Anemia   . Clotting disorder ([place])   . Constipation    related to pain meds and takes Colace daily  . Diabetic gastroparesis associated with type 2 diabetes mellitus ([place]) 10/31/2017  . Esophageal varices ([place])   . Fatty liver   . Gastric varices    takes Propranlol for this  . GERD (gastroesophageal reflux disease)    takes Nexium daily  . GI bleed june 2015  . AOZHYQMV(784.6)    "weekly" (10/18/2013)  . Hematemesis 10/18/2013   hospitalized  . Hepatitis yrs ago   hx of Hep A  . History of colon polyps   . History of staph infection 2014  . Hx of adenomatous colonic polyps 12/18/2017  . Hypertension    takes Atenolol,HCTZ,and Lisinopril daily  . Insomnia    but doesn't take any meds  . Iron deficiency anemia due to chronic blood loss 11/04/2017  . Liver cirrhosis secondary to NASH ([place]) + alcohol use? contribution 02/21/2011  . Neck pain    HNP and radiculpathy  . Neuromuscular disorder ([place])   . Numbness hands and fingers   and tingling  . Obesity   . Osteoarthritis    "hands; elbows; knees" (10/18/2013)  . Pancytopenia ([place])    but never had a blood  transfusion, had to take iron transfusion  sept 2014  . Sleep apnea    sleep study in epic from 2006 but doesn't use a cpap (10/18/2013)  . Thrombocytopenia ([place]) in cirrhosis and portal hypertension 06/25/2016  . Type II diabetes mellitus ([facility])    takes Novolin and Novolog and takes Janumet daily  . Urinary frequency     Patient Active Problem List   Diagnosis Date Noted  . RLQ abdominal pain 12/29/2018  . Liver lesion, left lobe 12/28/2018  . Iron deficiency anemia due to chronic blood loss 11/04/2017  . Diabetic gastroparesis associated with type 2 diabetes mellitus ([place]) 10/31/2017  . Dyspepsia 03/22/2017  . Thrombocytopenia ([place]) in cirrhosis and portal hypertension 06/25/2016  . Portal hypertensive gastropathy ([place])   . Obesity, morbid, BMI 40.0-49.9 ([place]) 06/18/2015  . Metabolic syndrome 96/29/5284  . Hx of adenomatous colonic polyps 02/25/2014  . GERD (gastroesophageal reflux disease)   . Secondary esophageal varices without bleeding ([place])   . Cervical herniated disc 05/30/2013  . insulin-dependent diabetes mellitus 04/27/2013  . Hypertension 02/21/2011  . Hyperlipidemia 02/21/2011  . NASH/ASH cirrhosis 02/21/2011    Past Surgical History:  Procedure Laterality Date  . ANTERIOR CERVICAL DECOMP/DISCECTOMY FUSION N/A 05/30/2013   Procedure: Cervical five-six  anterior cervical decompression with interbody prosthesis plating and bonegraft;  Surgeon: Ophelia Charter, MD;  Location: [facility];  Service: Neurosurgery;  Laterality: N/A;  Cervical five-six anterior cervical decompression with interbody prosthesis plating and bonegraft  . COLONOSCOPY WITH PROPOFOL N/A 02/07/2013   Procedure: COLONOSCOPY WITH PROPOFOL;  Surgeon: Arta Silence, MD;  Location: [facility]COPY;  Service: Endoscopy;  Laterality: N/A;  . COLONOSCOPY WITH PROPOFOL N/A 03/06/2014   Procedure: COLONOSCOPY WITH PROPOFOL;  Surgeon: Arta Silence, MD;  Location: [facility];  Service: Endoscopy;  Laterality:  N/A;  . ESOPHAGOGASTRODUODENOSCOPY N/A 10/19/2013   Procedure: ESOPHAGOGASTRODUODENOSCOPY (EGD);  Surgeon: Jeryl Columbia, MD;  Location: [facility] ENDOSCOPY;  Service: Endoscopy;  Laterality: N/A;  . ESOPHAGOGASTRODUODENOSCOPY (EGD) WITH PROPOFOL N/A 02/07/2013   Procedure: ESOPHAGOGASTRODUODENOSCOPY (EGD) WITH PROPOFOL;  Surgeon: Arta Silence, MD;  Location: [facility] ENDOSCOPY;  Service: Endoscopy;  Laterality: N/A;  . ESOPHAGOGASTRODUODENOSCOPY (EGD) WITH PROPOFOL N/A 03/06/2014   Procedure: ESOPHAGOGASTRODUODENOSCOPY (EGD) WITH PROPOFOL;  Surgeon: Arta Silence, MD;  Location: [facility];  Service: Endoscopy;  Laterality: N/A;  . ESOPHAGOGASTRODUODENOSCOPY (EGD) WITH PROPOFOL N/A 06/25/2016   Procedure: ESOPHAGOGASTRODUODENOSCOPY (EGD) WITH PROPOFOL;  Surgeon: Gatha Mayer, MD;  Location: [facility] ENDOSCOPY;  Service: Endoscopy;  Laterality: N/A;  . EUS N/A 03/06/2014   Procedure: ESOPHAGEAL ENDOSCOPIC ULTRASOUND (EUS) RADIAL;  Surgeon: Arta Silence, MD;  Location: [facility];  Service: Endoscopy;  Laterality: N/A;  . FLEXIBLE SIGMOIDOSCOPY N/A 12/14/2017   Procedure: FLEXIBLE SIGMOIDOSCOPY ;  Surgeon: Irving Copas., MD;  Location: [facility];  Service: Gastroenterology;  Laterality: N/A;  . HOT HEMOSTASIS N/A 12/14/2017   Procedure: HEMOSTASIS CLIPS;  Surgeon: Rush Landmark Telford Nab., MD;  Location: [facility];  Service: Gastroenterology;  Laterality: N/A;  . INCISION AND DRAINAGE MOUTH  02/2013   " jaw area"  . WISDOM TOOTH EXTRACTION  ~  1997       Home Medications    Prior to Admission medications   Medication Sig Start Date End Date Taking? Authorizing Provider  acetaminophen (TYLENOL) 500 MG tablet Take 500-1,000 mg by mouth every 6 (six) hours as needed for moderate pain or headache.    [provider]  esomeprazole (NEXIUM) 40 MG capsule Take 1 capsule (40 mg total) by mouth 2 (two) times daily before a meal. Patient taking differently: Take 20 mg by mouth daily.   10/19/13   Reyne Dumas, MD  gabapentin (NEURONTIN) 300 MG capsule TAKE 2 CAPSULES (600 MG TOTAL) BY MOUTH 3 (THREE) TIMES DAILY. 05/29/19   Elby Showers, MD  hydrochlorothiazide (HYDRODIURIL) 25 MG tablet TAKE 1 TABLET EVERY MORNING 03/07/19   Elby Showers, MD  HYDROcodone-acetaminophen (NORCO) 7.5-325 MG tablet Take 1 tablet by mouth every 6 (six) hours as needed for moderate pain. 07/30/19   Raylene Everts, MD  JANUMET 50-1000 MG tablet TAKE 1 TABLET TWICE A DAY WITH MEALS 03/07/19   Elby Showers, MD  lisinopril (ZESTRIL) 20 MG tablet TAKE 1 TABLET DAILY 05/23/19   Elby Showers, MD  metoCLOPramide (REGLAN) 10 MG tablet Take 1 tablet (10 mg total) by mouth 3 (three) times daily before meals. 11/27/18   Gatha Mayer, MD  nadolol (CORGARD) 20 MG tablet Take 1 tablet (20 mg total) by mouth daily. 04/30/19   Elby Showers, MD  naproxen sodium (ALEVE) 220 MG tablet Take 220 mg by mouth as needed.    [provider]  NOVOLOG MIX 70/30 FLEXPEN (70-30) 100 UNIT/ML FlexPen Inject 40 Units into the skin  3 (three) times daily.  10/20/16   [provider]  predniSONE (STERAPRED UNI-PAK 21 TAB) 10 MG (21) TBPK tablet Take by mouth daily. tad 07/30/19   Raylene Everts, MD  sodium chloride (OCEAN) 0.65 % SOLN nasal spray Place 2 sprays into both nostrils daily as needed for congestion.    [provider]  tiZANidine (ZANAFLEX) 4 MG tablet Take 1-2 tablets (4-8 mg total) by mouth every 6 (six) hours as needed for muscle spasms. 07/30/19   Raylene Everts, MD    Family History Family History  Problem Relation Age of Onset  . Hypertension Mother   . Heart failure Mother   . Pancreatitis Father   . Kidney cancer Father   . Brain cancer Sister   . Lung cancer Sister   . Kidney disease Brother   . Diabetes Brother   . Colon cancer Neg Hx   . Stomach cancer Neg Hx   . Liver disease Neg Hx   . Esophageal cancer Neg Hx   . Rectal cancer Neg Hx     Social History Social  History   Tobacco Use  . Smoking status: Never Smoker  . Smokeless tobacco: Never Used  . Tobacco comment: never used tobacco  Substance Use Topics  . Alcohol use: No    Alcohol/week: 0.0 standard drinks    Comment: quit drinking compeletely in Oct 2014  . Drug use: No     Allergies   Byetta 10 mcg pen [exenatide]   Review of Systems Review of Systems  Musculoskeletal: Positive for back pain.  Neurological: Negative for weakness and numbness.     Physical Exam Triage Vital Signs ED Triage Vitals  Enc Vitals Group     BP 07/30/19 0953 127/74     Pulse Rate 07/30/19 0953 68     Resp 07/30/19 0953 18     Temp 07/30/19 0953 98.5 F (36.9 C)     Temp Source 07/30/19 0953 Oral     SpO2 07/30/19 0953 97 %     Weight 07/30/19 0952 288 lb 12.8 oz (131 kg)     Height --      Head Circumference --      Peak Flow --      Pain Score 07/30/19 0951 7     Pain Loc --      Pain Edu? --      Excl. in [place]? --    No data found.  Updated Vital Signs BP 127/74 (BP Location: Right Arm)   Pulse 68   Temp 98.5 F (36.9 C) (Oral)   Resp 18   Wt 131 kg   SpO2 97%   BMI 43.91 kg/m   Visual Acuity Right Eye Distance:   Left Eye Distance:   Bilateral Distance:    Right Eye Near:   Left Eye Near:    Bilateral Near:     Physical Exam Constitutional:      General: He is not in acute distress.    Appearance: He is well-developed. He is obese.  HENT:     Head: Normocephalic and atraumatic.     Mouth/Throat:     Comments: Mask in place Eyes:     Conjunctiva/sclera: Conjunctivae normal.     Pupils: Pupils are equal, round, and reactive to light.  Cardiovascular:     Rate and Rhythm: Normal rate.  Pulmonary:     Effort: Pulmonary effort is normal. No respiratory distress.  Abdominal:     Comments:  protuberant  Musculoskeletal:        General: Normal range of motion.     Cervical back: Normal range of motion.     Comments: Tender right SI area.  Palp muscle tenderness  right lumbar.  SLR pos on the right for increased R leg pain No focal deficit  Skin:    General: Skin is warm and dry.  Neurological:     General: No focal deficit present.     Mental Status: He is alert.     Gait: Gait normal.     Deep Tendon Reflexes: Reflexes normal.  Psychiatric:        Mood and Affect: Mood normal.        Behavior: Behavior normal.      UC Treatments / Results  Labs (all labs ordered are listed, but only abnormal results are displayed) Labs Reviewed - No data to display  EKG   Radiology No results found.  Procedures Procedures (including critical care time)  Medications Ordered in UC Medications - No data to display  Initial Impression / Assessment and Plan / UC Course  I have reviewed the triage vital signs and the nursing notes.  Pertinent labs & imaging results that were available during my care of the patient were reviewed by me and considered in my medical decision making (see chart for details).     Discussed no indication for radiographs Discussed conservative management back pain Final Clinical Impressions(s) / UC Diagnoses   Final diagnoses:  Sciatica of right side     Discharge Instructions     Take the prednisone as directed Take all of day one today Take the muscle relaxer as needed This is helpful at night Take the pain medicine if needed Do not drive on the pain medication Reduce activity Return as needed   ED Prescriptions    Medication Sig Dispense Auth. Provider   predniSONE (STERAPRED UNI-PAK 21 TAB) 10 MG (21) TBPK tablet Take by mouth daily. tad 21 tablet Raylene Everts, MD   tiZANidine (ZANAFLEX) 4 MG tablet Take 1-2 tablets (4-8 mg total) by mouth every 6 (six) hours as needed for muscle spasms. 21 tablet Raylene Everts, MD   HYDROcodone-acetaminophen [facility]) 7.5-325 MG tablet Take 1 tablet by mouth every 6 (six) hours as needed for moderate pain. 15 tablet Raylene Everts, MD     I have  reviewed the PDMP during this encounter.   Raylene Everts, MD 07/30/19 1038

## 2019-07-30 NOTE — Discharge Instructions (Signed)
Take the prednisone as directed Take all of day one today Take the muscle relaxer as needed This is helpful at night Take the pain medicine if needed Do not drive on the pain medication Reduce activity Return as needed

## 2019-07-30 NOTE — ED Triage Notes (Signed)
Pt states he has pain radiating from his back to his right leg . X 3 days

## 2019-11-30 ENCOUNTER — Other Ambulatory Visit: Payer: Self-pay | Admitting: Internal Medicine

## 2019-12-02 NOTE — Telephone Encounter (Signed)
Needs to book appt for October before refilling

## 2019-12-03 ENCOUNTER — Telehealth: Payer: Self-pay

## 2019-12-03 ENCOUNTER — Other Ambulatory Visit: Payer: Self-pay

## 2019-12-03 DIAGNOSIS — K769 Liver disease, unspecified: Secondary | ICD-10-CM

## 2019-12-03 NOTE — Telephone Encounter (Signed)
Scheduled patient for MRI Abdomen on 12/27/19 at [facility] at 2 pm with a 1:30 pm arrival time, NPO 4 hours prior, arrive alone with a mask.   Lm on vm for patient to return call for MRI appt information.

## 2019-12-03 NOTE — Telephone Encounter (Signed)
-----   Message from Marlon Pel, RN sent at 12/01/2018  3:03 PM EDT ----- Needs MRI  see results 12/01/18 Carlean Purl

## 2019-12-05 NOTE — Telephone Encounter (Signed)
Lm on mobile vm for patient to return call

## 2019-12-12 NOTE — Telephone Encounter (Signed)
Lm on vm for patient to return call. Will mail patient a letter with appointment information.

## 2019-12-27 ENCOUNTER — Ambulatory Visit
Admission: RE | Admit: 2019-12-27 | Discharge: 2019-12-27 | Disposition: A | Discharge status: Home / Self Care | Payer: BC Managed Care – PPO | Source: Ambulatory Visit | Attending: Internal Medicine | Admitting: Internal Medicine

## 2019-12-27 ENCOUNTER — Other Ambulatory Visit: Payer: Self-pay

## 2019-12-27 DIAGNOSIS — I864 Gastric varices: Secondary | ICD-10-CM | POA: Diagnosis not present

## 2019-12-27 DIAGNOSIS — K7689 Other specified diseases of liver: Secondary | ICD-10-CM | POA: Diagnosis not present

## 2019-12-27 DIAGNOSIS — K766 Portal hypertension: Secondary | ICD-10-CM | POA: Diagnosis not present

## 2019-12-27 DIAGNOSIS — K7460 Unspecified cirrhosis of liver: Secondary | ICD-10-CM | POA: Diagnosis not present

## 2019-12-27 DIAGNOSIS — K769 Liver disease, unspecified: Secondary | ICD-10-CM

## 2019-12-27 MED ORDER — GADOBENATE DIMEGLUMINE 529 MG/ML IV SOLN
20.0000 mL | Freq: Once | INTRAVENOUS | Status: AC | PRN
  Administered 2019-12-27: 20 mL via INTRAVENOUS

## 2020-01-22 DIAGNOSIS — I10 Essential (primary) hypertension: Secondary | ICD-10-CM | POA: Diagnosis not present

## 2020-01-22 DIAGNOSIS — E1165 Type 2 diabetes mellitus with hyperglycemia: Secondary | ICD-10-CM | POA: Diagnosis not present

## 2020-01-22 DIAGNOSIS — E1140 Type 2 diabetes mellitus with diabetic neuropathy, unspecified: Secondary | ICD-10-CM | POA: Diagnosis not present

## 2020-01-22 DIAGNOSIS — E7800 Pure hypercholesterolemia, unspecified: Secondary | ICD-10-CM | POA: Diagnosis not present

## 2020-01-22 DIAGNOSIS — K769 Liver disease, unspecified: Secondary | ICD-10-CM | POA: Diagnosis not present

## 2020-01-28 DIAGNOSIS — K769 Liver disease, unspecified: Secondary | ICD-10-CM | POA: Diagnosis not present

## 2020-01-28 DIAGNOSIS — E1140 Type 2 diabetes mellitus with diabetic neuropathy, unspecified: Secondary | ICD-10-CM | POA: Diagnosis not present

## 2020-01-28 DIAGNOSIS — E1165 Type 2 diabetes mellitus with hyperglycemia: Secondary | ICD-10-CM | POA: Diagnosis not present

## 2020-01-28 DIAGNOSIS — I10 Essential (primary) hypertension: Secondary | ICD-10-CM | POA: Diagnosis not present

## 2020-02-05 ENCOUNTER — Other Ambulatory Visit: Payer: Self-pay | Admitting: Internal Medicine

## 2020-02-15 ENCOUNTER — Other Ambulatory Visit: Payer: Self-pay

## 2020-02-15 ENCOUNTER — Other Ambulatory Visit: Payer: BC Managed Care – PPO | Admitting: Internal Medicine

## 2020-02-15 DIAGNOSIS — I10 Essential (primary) hypertension: Secondary | ICD-10-CM

## 2020-02-15 DIAGNOSIS — E109 Type 1 diabetes mellitus without complications: Secondary | ICD-10-CM

## 2020-02-15 DIAGNOSIS — K219 Gastro-esophageal reflux disease without esophagitis: Secondary | ICD-10-CM

## 2020-02-15 DIAGNOSIS — Z8639 Personal history of other endocrine, nutritional and metabolic disease: Secondary | ICD-10-CM

## 2020-02-15 DIAGNOSIS — Z6841 Body Mass Index (BMI) 40.0 and over, adult: Secondary | ICD-10-CM

## 2020-02-15 DIAGNOSIS — I8510 Secondary esophageal varices without bleeding: Secondary | ICD-10-CM

## 2020-02-15 DIAGNOSIS — K7030 Alcoholic cirrhosis of liver without ascites: Secondary | ICD-10-CM

## 2020-02-15 DIAGNOSIS — M722 Plantar fascial fibromatosis: Secondary | ICD-10-CM

## 2020-02-15 DIAGNOSIS — D696 Thrombocytopenia, unspecified: Secondary | ICD-10-CM

## 2020-02-15 DIAGNOSIS — E1142 Type 2 diabetes mellitus with diabetic polyneuropathy: Secondary | ICD-10-CM | POA: Diagnosis not present

## 2020-02-15 DIAGNOSIS — Z0000 Encounter for general adult medical examination without abnormal findings: Secondary | ICD-10-CM

## 2020-02-15 DIAGNOSIS — K7460 Unspecified cirrhosis of liver: Secondary | ICD-10-CM | POA: Diagnosis not present

## 2020-02-16 LAB — CBC WITH DIFFERENTIAL/PLATELET
Absolute Monocytes: 197 cells/uL — ABNORMAL LOW (ref 200–950)
Basophils Absolute: 9 cells/uL (ref 0–200)
Basophils Relative: 0.3 %
Eosinophils Absolute: 78 cells/uL (ref 15–500)
Eosinophils Relative: 2.7 %
HCT: 46.3 % (ref 38.5–50.0)
Hemoglobin: 15.8 g/dL (ref 13.2–17.1)
Lymphs Abs: 838 cells/uL — ABNORMAL LOW (ref 850–3900)
MCH: 32.8 pg (ref 27.0–33.0)
MCHC: 34.1 g/dL (ref 32.0–36.0)
MCV: 96.1 fL (ref 80.0–100.0)
MPV: 10.9 fL (ref 7.5–12.5)
Monocytes Relative: 6.8 %
Neutro Abs: 1778 cells/uL (ref 1500–7800)
Neutrophils Relative %: 61.3 %
Platelets: 104 10*3/uL — ABNORMAL LOW (ref 140–400)
RBC: 4.82 10*6/uL (ref 4.20–5.80)
RDW: 14.3 % (ref 11.0–15.0)
Total Lymphocyte: 28.9 %
WBC: 2.9 10*3/uL — ABNORMAL LOW (ref 3.8–10.8)

## 2020-02-16 LAB — COMPLETE METABOLIC PANEL WITH GFR
AG Ratio: 1.3 (calc) (ref 1.0–2.5)
ALT: 24 U/L (ref 9–46)
AST: 28 U/L (ref 10–35)
Albumin: 3.8 g/dL (ref 3.6–5.1)
Alkaline phosphatase (APISO): 89 U/L (ref 35–144)
BUN: 11 mg/dL (ref 7–25)
CO2: 26 mmol/L (ref 20–32)
Calcium: 9.3 mg/dL (ref 8.6–10.3)
Chloride: 105 mmol/L (ref 98–110)
Creat: 0.95 mg/dL (ref 0.70–1.33)
GFR, Est African American: 105 mL/min/{1.73_m2} (ref >=60)
GFR, Est Non African American: 91 mL/min/{1.73_m2} (ref >=60)
Globulin: 3 g/dL (calc) (ref 1.9–3.7)
Glucose, Bld: 148 mg/dL — ABNORMAL HIGH (ref 65–99)
Potassium: 4.5 mmol/L (ref 3.5–5.3)
Sodium: 138 mmol/L (ref 135–146)
Total Bilirubin: 1.1 mg/dL (ref 0.2–1.2)
Total Protein: 6.8 g/dL (ref 6.1–8.1)

## 2020-02-16 LAB — PSA: PSA: 0.26 ng/mL (ref <=4.0)

## 2020-02-16 LAB — LIPID PANEL
Cholesterol: 151 mg/dL (ref <200)
HDL: 52 mg/dL (ref >=40)
LDL Cholesterol (Calc): 84 mg/dL (calc)
Non-HDL Cholesterol (Calc): 99 mg/dL (calc) (ref <130)
Total CHOL/HDL Ratio: 2.9 (calc) (ref <5.0)
Triglycerides: 73 mg/dL (ref <150)

## 2020-02-16 LAB — HEMOGLOBIN A1C
Hgb A1c MFr Bld: 7.1 % of total Hgb — ABNORMAL HIGH (ref <5.7)
Mean Plasma Glucose: 157 (calc)
eAG (mmol/L): 8.7 (calc)

## 2020-02-16 LAB — MICROALBUMIN / CREATININE URINE RATIO
Creatinine, Urine: 46 mg/dL (ref 20–320)
Microalb Creat Ratio: 11 mcg/mg creat (ref <30)
Microalb, Ur: 0.5 mg/dL

## 2020-02-19 ENCOUNTER — Encounter: Payer: Self-pay | Admitting: Internal Medicine

## 2020-02-19 ENCOUNTER — Ambulatory Visit (INDEPENDENT_AMBULATORY_CARE_PROVIDER_SITE_OTHER): Payer: BC Managed Care – PPO | Admitting: Internal Medicine

## 2020-02-19 ENCOUNTER — Other Ambulatory Visit: Payer: Self-pay

## 2020-02-19 VITALS — BP 110/80 | HR 66 | Ht 68.0 in | Wt 295.0 lb

## 2020-02-19 DIAGNOSIS — Z0000 Encounter for general adult medical examination without abnormal findings: Secondary | ICD-10-CM

## 2020-02-19 DIAGNOSIS — Z8639 Personal history of other endocrine, nutritional and metabolic disease: Secondary | ICD-10-CM

## 2020-02-19 DIAGNOSIS — Z8719 Personal history of other diseases of the digestive system: Secondary | ICD-10-CM

## 2020-02-19 DIAGNOSIS — I10 Essential (primary) hypertension: Secondary | ICD-10-CM

## 2020-02-19 DIAGNOSIS — Z6841 Body Mass Index (BMI) 40.0 and over, adult: Secondary | ICD-10-CM

## 2020-02-19 DIAGNOSIS — I8510 Secondary esophageal varices without bleeding: Secondary | ICD-10-CM

## 2020-02-19 DIAGNOSIS — K7030 Alcoholic cirrhosis of liver without ascites: Secondary | ICD-10-CM

## 2020-02-19 DIAGNOSIS — D696 Thrombocytopenia, unspecified: Secondary | ICD-10-CM

## 2020-02-19 DIAGNOSIS — Z23 Encounter for immunization: Secondary | ICD-10-CM | POA: Diagnosis not present

## 2020-02-19 DIAGNOSIS — E1142 Type 2 diabetes mellitus with diabetic polyneuropathy: Secondary | ICD-10-CM

## 2020-02-19 DIAGNOSIS — M79672 Pain in left foot: Secondary | ICD-10-CM

## 2020-02-19 DIAGNOSIS — D708 Other neutropenia: Secondary | ICD-10-CM

## 2020-02-19 DIAGNOSIS — Z860101 Personal history of adenomatous and serrated colon polyps: Secondary | ICD-10-CM

## 2020-02-19 DIAGNOSIS — Z86010 Personal history of colonic polyps: Secondary | ICD-10-CM

## 2020-02-19 DIAGNOSIS — K219 Gastro-esophageal reflux disease without esophagitis: Secondary | ICD-10-CM

## 2020-02-19 LAB — POCT URINALYSIS DIPSTICK
Appearance: NEGATIVE
Bilirubin, UA: NEGATIVE
Blood, UA: NEGATIVE
Glucose, UA: POSITIVE — AB
Ketones, UA: NEGATIVE
Leukocytes, UA: NEGATIVE
Nitrite, UA: NEGATIVE
Odor: NEGATIVE
Protein, UA: NEGATIVE
Spec Grav, UA: 1.01 (ref 1.010–1.025)
Urobilinogen, UA: 0.2 E.U./dL
pH, UA: 6.5 (ref 5.0–8.0)

## 2020-02-19 NOTE — Patient Instructions (Addendum)
It was a pleasure to see you today.  Continue current medications and follow-up in 1 year or as needed.  Flu vaccine given today.  Have annual diabetic eye exam.  Have third COVID-19 vaccine when available.

## 2020-02-19 NOTE — Progress Notes (Signed)
Subjective:    Patient ID: Oscar [name], male    DOB: 11/23/1965, 54 y.o.   MRN: [id number]  HPI 54 year old Male seen for health maintenance exam and evaluation of medical issues.  He is having some issues with his left foot.  Recommend seeing orthopedist.  Seems to be left lateral foot.  He has a history of many medical issues including hypertension, insulin-dependent diabetes mellitus which is treated by Dr. Michiel Sites, endocrinologist.  History of cirrhosis and esophageal varices.  History of leukopenia and thrombocytopenia.  Has seen Dr. Marin Olp in the past for this.  History of obstructive sleep apnea, morbid obesity and GE reflux.  Multiple admissions in the past for GI bleeding but none recently.  Had endoscopy in 2018 for esophageal varices monitoring.  He had no varices in the proximal stomach.  He had grade 2 less than 5 mm esophageal varices.  Colonoscopy was performed and one polyp removed.  Post polypectomy he had bleeding due to liver disease.  Hemostasis was achieved after placement of clips.    In 2019 he had acute GI bleed seen in the emergency department after having had a colonoscopy the previous day with removal of polyps.  3 polyps were hyperplastic and 4 were adenomatous 1 of which was rather large at 10 mm  In 2015 he had a C5-C6 anterior discectomy by Dr. Arnoldo Morale.  Gastroenterologist is Dr. Carlean Purl.  History of sleep apnea treated by Dr. Annamaria Boots in 2006 with CPAP but currently not using it.  He used to see Dr. Bing Plume for eye exam but now sees physician in [facility] and is on his insurance plan.  Wife has previously told  me that he has been diagnosed with NASH.  Flu vaccine given today.  No known drug allergies. Peripheral neuropathy.  Takes PPI.  He is insulin-dependent and says diabetes is under fairly good control.  Social history: He is married.  Says he no longer drinks alcohol or uses drugs.  Non-smoker.  No children.  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  He continues to work in a warehouse.  He is on his feet a lot and that may be aggravating his left foot.    Review of Systems only new complaint is left lateral foot pain    Objective:   Physical Exam Blood pressure 110/80 pulse 66 pulse oximetry 97% weight 295 pounds BMI 44.85  Skin warm and dry.  Nodes none.  TMs are clear.  Neck is supple without JVD thyromegaly or carotid bruits.  Chest clear to auscultation.  Cardiac exam: Regular rate and rhythm normal S1 and S2 without murmurs or gallops.  Abdomen: Obese soft nondistended without palpable hepatosplenomegaly masses or tenderness.  No pitting edema of the lower extremities.  Prostate is normal.  Neuro: No gross focal deficits on brief neurological exam.  He is alert and oriented x3.       Assessment & Plan:  Insulin-dependent diabetes mellitus followed by endocrinology.  Essential hypertension-stable on current regimen  History of cirrhosis  Esophageal varices  Thrombocytopenia and leukopenia related to alcohol abuse history and has been seen by Dr. Marin Olp  GE reflux treated with PPI  History of gastroparesis likely related to diabetes.  Has been tried on Reglan in the past.  History of fatty liver  Morbid obesity  History of multiple admissions for GI bleeding  History of alcohol abuse but  not drinking at the present time  History of adenomatous colon polyp  Plan: We will continue with current medications and follow-up in 1 year or as needed.  Flu vaccine given.  Reminded about annual diabetic eye exam.  His hemoglobin A1c is stable at 7.1%.  Lipid panel is normal.  CBC shows white blood cell count of 2900, platelet count 104,000 but normal hemoglobin 15.8 g.  Fasting glucose is 148.  PSA is 0.26

## 2020-03-03 ENCOUNTER — Other Ambulatory Visit: Payer: Self-pay | Admitting: Internal Medicine

## 2020-04-07 ENCOUNTER — Other Ambulatory Visit: Payer: Self-pay | Admitting: Internal Medicine

## 2020-04-14 ENCOUNTER — Ambulatory Visit: Payer: BC Managed Care – PPO | Admitting: Internal Medicine

## 2020-05-09 DIAGNOSIS — M79671 Pain in right foot: Secondary | ICD-10-CM | POA: Diagnosis not present

## 2020-05-09 DIAGNOSIS — M79672 Pain in left foot: Secondary | ICD-10-CM | POA: Diagnosis not present

## 2020-05-09 DIAGNOSIS — M25571 Pain in right ankle and joints of right foot: Secondary | ICD-10-CM | POA: Diagnosis not present

## 2020-05-09 DIAGNOSIS — M25572 Pain in left ankle and joints of left foot: Secondary | ICD-10-CM | POA: Diagnosis not present

## 2020-05-26 ENCOUNTER — Encounter: Payer: Self-pay | Admitting: Internal Medicine

## 2020-05-26 ENCOUNTER — Other Ambulatory Visit (INDEPENDENT_AMBULATORY_CARE_PROVIDER_SITE_OTHER): Payer: BC Managed Care – PPO

## 2020-05-26 ENCOUNTER — Ambulatory Visit (INDEPENDENT_AMBULATORY_CARE_PROVIDER_SITE_OTHER): Payer: BC Managed Care – PPO | Admitting: Internal Medicine

## 2020-05-26 VITALS — BP 120/70 | HR 72 | Ht 67.5 in | Wt 299.1 lb

## 2020-05-26 DIAGNOSIS — K7469 Other cirrhosis of liver: Secondary | ICD-10-CM | POA: Diagnosis not present

## 2020-05-26 DIAGNOSIS — E1143 Type 2 diabetes mellitus with diabetic autonomic (poly)neuropathy: Secondary | ICD-10-CM | POA: Diagnosis not present

## 2020-05-26 DIAGNOSIS — K3184 Gastroparesis: Secondary | ICD-10-CM

## 2020-05-26 DIAGNOSIS — K920 Hematemesis: Secondary | ICD-10-CM | POA: Diagnosis not present

## 2020-05-26 DIAGNOSIS — K766 Portal hypertension: Secondary | ICD-10-CM

## 2020-05-26 DIAGNOSIS — I8510 Secondary esophageal varices without bleeding: Secondary | ICD-10-CM

## 2020-05-26 DIAGNOSIS — K3189 Other diseases of stomach and duodenum: Secondary | ICD-10-CM

## 2020-05-26 LAB — CBC WITH DIFFERENTIAL/PLATELET
Basophils Absolute: 0 10*3/uL (ref 0.0–0.1)
Basophils Relative: 0.6 % (ref 0.0–3.0)
Eosinophils Absolute: 0.1 10*3/uL (ref 0.0–0.7)
Eosinophils Relative: 3.4 % (ref 0.0–5.0)
HCT: 45.9 % (ref 39.0–52.0)
Hemoglobin: 16.1 g/dL (ref 13.0–17.0)
Lymphocytes Relative: 33.8 % (ref 12.0–46.0)
Lymphs Abs: 1.2 10*3/uL (ref 0.7–4.0)
MCHC: 35 g/dL (ref 30.0–36.0)
MCV: 94 fl (ref 78.0–100.0)
Monocytes Absolute: 0.3 10*3/uL (ref 0.1–1.0)
Monocytes Relative: 7.7 % (ref 3.0–12.0)
Neutro Abs: 2 10*3/uL (ref 1.4–7.7)
Neutrophils Relative %: 54.5 % (ref 43.0–77.0)
Platelets: 103 10*3/uL — ABNORMAL LOW (ref 150.0–400.0)
RBC: 4.88 Mil/uL (ref 4.22–5.81)
RDW: 15.5 % (ref 11.5–15.5)
WBC: 3.6 10*3/uL — ABNORMAL LOW (ref 4.0–10.5)

## 2020-05-26 LAB — PROTIME-INR
INR: 1.2 ratio — ABNORMAL HIGH (ref 0.8–1.0)
Prothrombin Time: 13.2 s — ABNORMAL HIGH (ref 9.6–13.1)

## 2020-05-26 NOTE — Patient Instructions (Signed)
Your provider has requested that you go to the basement level for lab work before leaving today. Press "B" on the elevator. The lab is located at the first door on the left as you exit the elevator.  Due to recent changes in healthcare laws, you may see the results of your imaging and laboratory studies on MyChart before your provider has had a chance to review them.  We understand that in some cases there may be results that are confusing or concerning to you. Not all laboratory results come back in the same time frame and the provider may be waiting for multiple results in order to interpret others.  Please give us 48 hours in order for your provider to thoroughly review all the results before contacting the office for clarification of your results.   You have been scheduled for an endoscopy. Please follow written instructions given to you at your visit today. If you use inhalers (even only as needed), please bring them with you on the day of your procedure.   I appreciate the opportunity to care for you. Carl Gessner, MD, FACG 

## 2020-05-26 NOTE — H&P (View-Only) (Signed)
Oscar [name] 55 y.o. 12-03-1965 [id number]  Assessment & Plan:   Encounter Diagnoses  Name Primary?  . Hematemesis with nausea Yes  . Diabetic gastroparesis associated with type 2 diabetes mellitus ([place])   . NASH/ASH cirrhosis   . Secondary esophageal varices without bleeding ([place])   . Portal hypertensive gastropathy (HCC)     Evaluate minor hematemesis with an EGD given his overall situation.  Fortunately he has not had recurrent problems.  Check CBC and INR as well.   Subjective:   Chief Complaint: Hematemesis  HPI Oscar [name] is here with his wife for follow-up, he has Karlene Lineman cirrhosis of the liver with a history of small esophageal varices and portal gastropathy treated on nadolol, and around Christmas time he had some vomiting of a small amount of bright red blood.  It was a one-time thing.  He also has delayed gastric emptying/gastroparesis in the setting of diabetes.  He has not had further problems.  No melena.  When he saw Dr. Renold Genta in the fall his CBC was normal.  Hemoglobin A1c down to 7.1%.  He feels okay now.  He does take his PPI regularly.  He does sometimes need antacids to treat heartburn.  Situational stress his brother who had had a kidney transplant is in the hospital in [place] with Covid and has been very ill though they have gotten some encouraging news recently.   He has a chronic mild ache in the right lower quadrant that seem to start when he had a flare of sciatica.  This is not something that interferes with his life.  Lab Results  Component Value Date   ALT 24 02/15/2020   AST 28 02/15/2020   ALKPHOS 77 12/28/2018   BILITOT 1.1 02/15/2020   Lab Results  Component Value Date   CREATININE 0.95 02/15/2020   BUN 11 02/15/2020   NA 138 02/15/2020   K 4.5 02/15/2020   CL 105 02/15/2020   CO2 26 02/15/2020     Wt Readings from Last 3 Encounters:  05/26/20 299 lb 2 oz (135.7 kg)  02/19/20 295 lb (133.8 kg)  07/30/19 288 lb 12.8 oz (131  kg)  MRI of the liver IMPRESSION: 1. A previously seen focus of arterial hyperenhancement in the anterior liver on prior examination is not noted. No mass or abnormal contrast enhancement appreciated. 2. Stigmata of cirrhosis and portal hypertension including splenomegaly, varices, and recanalized umbilical vein.   Electronically Signed   By: Eddie Candle M.D.   On: 12/27/2019 15:15  Gastric emptying study July 2019 FINDINGS: Expected location of the stomach in the left upper quadrant. Ingested meal empties the stomach gradually over the course of the study.  7% emptied at 1 hr ( normal >= 10%)  8% emptied at 2 hr ( normal >= 40%)  22% emptied at 3 hr ( normal >= 70%)  37% emptied at 4 hr ( normal >= 90%)  IMPRESSION: Delayed gastric emptying study.    Allergies  Allergen Reactions  . Byetta 10 Mcg Pen [Exenatide] Nausea And Vomiting   Current Meds  Medication Sig  . acetaminophen (TYLENOL) 500 MG tablet Take 500-1,000 mg by mouth every 6 (six) hours as needed for moderate pain or headache.  . dapagliflozin propanediol (FARXIGA) 10 MG TABS tablet Take 10 mg by mouth daily.  . Diclofenac Sodium (PENNSAID) 2 % SOLN Pennsaid 20 mg/gram/actuation (2 %) topical soln in metered-dose pump  APPLY 2 PUMPS (40 MG) TO THE AFFECTED AREA BY TOPICAL  ROUTE 2 TIMES PER DAY  . esomeprazole (NEXIUM) 40 MG capsule Take 1 capsule (40 mg total) by mouth 2 (two) times daily before a meal. (Patient taking differently: Take 20 mg by mouth daily.)  . gabapentin (NEURONTIN) 300 MG capsule TAKE 2 CAPSULES (600 MG TOTAL) BY MOUTH 3 (THREE) TIMES DAILY.  . hydrochlorothiazide (HYDRODIURIL) 25 MG tablet TAKE 1 TABLET EVERY MORNING  . JANUMET 50-1000 MG tablet TAKE 1 TABLET TWICE A DAY WITH MEALS  . lisinopril (ZESTRIL) 20 MG tablet TAKE 1 TABLET DAILY  . nadolol (CORGARD) 20 MG tablet TAKE 1 TABLET BY MOUTH EVERY DAY  . naproxen sodium (ALEVE) 220 MG tablet Take 220 mg by mouth as needed.   Marland Kitchen NOVOLOG MIX 70/30 FLEXPEN (70-30) 100 UNIT/ML FlexPen Inject 40 Units into the skin 3 (three) times daily.   . sodium chloride (OCEAN) 0.65 % SOLN nasal spray Place 2 sprays into both nostrils daily as needed for congestion.   Past Medical History:  Diagnosis Date  . Allergy   . Anemia   . Clotting disorder ([place])   . Constipation    related to pain meds and takes Colace daily  . Diabetic gastroparesis associated with type 2 diabetes mellitus ([place]) 10/31/2017  . Esophageal varices ([place])   . Fatty liver   . Gastric varices    takes Propranlol for this  . GERD (gastroesophageal reflux disease)    takes Nexium daily  . GI bleed june 2015  . QQVZDGLO(756.4)    "weekly" (10/18/2013)  . Hematemesis 10/18/2013   hospitalized  . Hepatitis yrs ago   hx of Hep A  . History of colon polyps   . History of staph infection 2014  . Hx of adenomatous colonic polyps 12/18/2017  . Hypertension    takes Atenolol,HCTZ,and Lisinopril daily  . Insomnia    but doesn't take any meds  . Iron deficiency anemia due to chronic blood loss 11/04/2017  . Liver cirrhosis secondary to NASH ([place]) + alcohol use? contribution 02/21/2011  . Neck pain    HNP and radiculpathy  . Neuromuscular disorder ([place])   . Numbness hands and fingers   and tingling  . Obesity   . Osteoarthritis    "hands; elbows; knees" (10/18/2013)  . Pancytopenia ([place])    but never had a blood transfusion, had to take iron transfusion  sept 2014  . Sleep apnea    sleep study in epic from 2006 but doesn't use a cpap (10/18/2013)  . Thrombocytopenia ([place]) in cirrhosis and portal hypertension 06/25/2016  . Type II diabetes mellitus (HCC)    takes Novolin and Novolog and takes Janumet daily  . Urinary frequency    Past Surgical History:  Procedure Laterality Date  . ANTERIOR CERVICAL DECOMP/DISCECTOMY FUSION N/A 05/30/2013   Procedure: Cervical five-six anterior cervical decompression with interbody prosthesis plating and bonegraft;  Surgeon:  Ophelia Charter, MD;  Location: [facility];  Service: Neurosurgery;  Laterality: N/A;  Cervical five-six anterior cervical decompression with interbody prosthesis plating and bonegraft  . COLONOSCOPY WITH PROPOFOL N/A 02/07/2013   Procedure: COLONOSCOPY WITH PROPOFOL;  Surgeon: Arta Silence, MD;  Location: [facility];  Service: Endoscopy;  Laterality: N/A;  . COLONOSCOPY WITH PROPOFOL N/A 03/06/2014   Procedure: COLONOSCOPY WITH PROPOFOL;  Surgeon: Arta Silence, MD;  Location: [facility];  Service: Endoscopy;  Laterality: N/A;  . ESOPHAGOGASTRODUODENOSCOPY N/A 10/19/2013   Procedure: ESOPHAGOGASTRODUODENOSCOPY (EGD);  Surgeon: Jeryl Columbia, MD;  Location: [facility];  Service: Endoscopy;  Laterality:  N/A;  . ESOPHAGOGASTRODUODENOSCOPY (EGD) WITH PROPOFOL N/A 02/07/2013   Procedure: ESOPHAGOGASTRODUODENOSCOPY (EGD) WITH PROPOFOL;  Surgeon: Arta Silence, MD;  [facility]: [facility];  Service: Endoscopy;  Laterality: N/A;  . ESOPHAGOGASTRODUODENOSCOPY (EGD) WITH PROPOFOL N/A 03/06/2014   Procedure: ESOPHAGOGASTRODUODENOSCOPY (EGD) WITH PROPOFOL;  Surgeon: Arta Silence, MD;  [facility]: [facility];  Service: Endoscopy;  Laterality: N/A;  . ESOPHAGOGASTRODUODENOSCOPY (EGD) WITH PROPOFOL N/A 06/25/2016   Procedure: ESOPHAGOGASTRODUODENOSCOPY (EGD) WITH PROPOFOL;  Surgeon: Gatha Mayer, MD;  Location: [facility];  Service: Endoscopy;  Laterality: N/A;  . EUS N/A 03/06/2014   Procedure: ESOPHAGEAL ENDOSCOPIC ULTRASOUND (EUS) RADIAL;  Surgeon: Arta Silence, MD;  Location: [facility];  Service: Endoscopy;  Laterality: N/A;  . FLEXIBLE SIGMOIDOSCOPY N/A 12/14/2017   Procedure: FLEXIBLE SIGMOIDOSCOPY ;  Surgeon: Irving Copas., MD;  Location: [facility];  Service: Gastroenterology;  Laterality: N/A;  . HOT HEMOSTASIS N/A 12/14/2017   Procedure: HEMOSTASIS CLIPS;  Surgeon: Rush Landmark Telford Nab., MD;  Location: [facility];  Service: Gastroenterology;  Laterality: N/A;  .  INCISION AND DRAINAGE MOUTH  02/2013   " jaw area"  . 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   Review of Systems As above  Objective:   Physical Exam BP 120/70 (BP Location: Left Arm, Patient Position: Sitting, Cuff Size: Normal)   Pulse 72   Ht 5' 7.5" (1.715 m) Comment: height measured without shoes  Wt 299 lb 2 oz (135.7 kg)   BMI 46.16 kg/m  Obese Hispanic man no acute distress Eyes are anicteric Lungs clear Heart sounds normal with S1-S2 no rubs murmurs or gallops Abdomen is morbidly obese soft nontender I do not see stigmata of chronic liver disease in his body

## 2020-05-26 NOTE — Progress Notes (Signed)
Oscar [name] 55 y.o. 12-03-1965 [id number]  Assessment & Plan:   Encounter Diagnoses  Name Primary?  . Hematemesis with nausea Yes  . Diabetic gastroparesis associated with type 2 diabetes mellitus ([place])   . NASH/ASH cirrhosis   . Secondary esophageal varices without bleeding ([place])   . Portal hypertensive gastropathy (HCC)     Evaluate minor hematemesis with an EGD given his overall situation.  Fortunately he has not had recurrent problems.  Check CBC and INR as well.   Subjective:   Chief Complaint: Hematemesis  HPI Oscar [name] is here with his wife for follow-up, he has Oscar [name] cirrhosis of the liver with a history of small esophageal varices and portal gastropathy treated on nadolol, and around Christmas time he had some vomiting of a small amount of bright red blood.  It was a one-time thing.  He also has delayed gastric emptying/gastroparesis in the setting of diabetes.  He has not had further problems.  No melena.  When he saw Dr. Renold Genta in the fall his CBC was normal.  Hemoglobin A1c down to 7.1%.  He feels okay now.  He does take his PPI regularly.  He does sometimes need antacids to treat heartburn.  Situational stress his brother who had had a kidney transplant is in the hospital in [place] with Covid and has been very ill though they have gotten some encouraging news recently.   He has a chronic mild ache in the right lower quadrant that seem to start when he had a flare of sciatica.  This is not something that interferes with his life.  Lab Results  Component Value Date   ALT 24 02/15/2020   AST 28 02/15/2020   ALKPHOS 77 12/28/2018   BILITOT 1.1 02/15/2020   Lab Results  Component Value Date   CREATININE 0.95 02/15/2020   BUN 11 02/15/2020   NA 138 02/15/2020   K 4.5 02/15/2020   CL 105 02/15/2020   CO2 26 02/15/2020     Wt Readings from Last 3 Encounters:  05/26/20 299 lb 2 oz (135.7 kg)  02/19/20 295 lb (133.8 kg)  07/30/19 288 lb 12.8 oz (131  kg)  MRI of the liver IMPRESSION: 1. A previously seen focus of arterial hyperenhancement in the anterior liver on prior examination is not noted. No mass or abnormal contrast enhancement appreciated. 2. Stigmata of cirrhosis and portal hypertension including splenomegaly, varices, and recanalized umbilical vein.   Electronically Signed   By: Eddie Candle M.D.   On: 12/27/2019 15:15  Gastric emptying study July 2019 FINDINGS: Expected location of the stomach in the left upper quadrant. Ingested meal empties the stomach gradually over the course of the study.  7% emptied at 1 hr ( normal >= 10%)  8% emptied at 2 hr ( normal >= 40%)  22% emptied at 3 hr ( normal >= 70%)  37% emptied at 4 hr ( normal >= 90%)  IMPRESSION: Delayed gastric emptying study.    Allergies  Allergen Reactions  . Byetta 10 Mcg Pen [Exenatide] Nausea And Vomiting   Current Meds  Medication Sig  . acetaminophen (TYLENOL) 500 MG tablet Take 500-1,000 mg by mouth every 6 (six) hours as needed for moderate pain or headache.  . dapagliflozin propanediol (FARXIGA) 10 MG TABS tablet Take 10 mg by mouth daily.  . Diclofenac Sodium (PENNSAID) 2 % SOLN Pennsaid 20 mg/gram/actuation (2 %) topical soln in metered-dose pump  APPLY 2 PUMPS (40 MG) TO THE AFFECTED AREA BY TOPICAL  ROUTE 2 TIMES PER DAY  . esomeprazole (NEXIUM) 40 MG capsule Take 1 capsule (40 mg total) by mouth 2 (two) times daily before a meal. (Patient taking differently: Take 20 mg by mouth daily.)  . gabapentin (NEURONTIN) 300 MG capsule TAKE 2 CAPSULES (600 MG TOTAL) BY MOUTH 3 (THREE) TIMES DAILY.  . hydrochlorothiazide (HYDRODIURIL) 25 MG tablet TAKE 1 TABLET EVERY MORNING  . JANUMET 50-1000 MG tablet TAKE 1 TABLET TWICE A DAY WITH MEALS  . lisinopril (ZESTRIL) 20 MG tablet TAKE 1 TABLET DAILY  . nadolol (CORGARD) 20 MG tablet TAKE 1 TABLET BY MOUTH EVERY DAY  . naproxen sodium (ALEVE) 220 MG tablet Take 220 mg by mouth as needed.   Marland Kitchen NOVOLOG MIX 70/30 FLEXPEN (70-30) 100 UNIT/ML FlexPen Inject 40 Units into the skin 3 (three) times daily.   . sodium chloride (OCEAN) 0.65 % SOLN nasal spray Place 2 sprays into both nostrils daily as needed for congestion.   Past Medical History:  Diagnosis Date  . Allergy   . Anemia   . Clotting disorder ([place])   . Constipation    related to pain meds and takes Colace daily  . Diabetic gastroparesis associated with type 2 diabetes mellitus ([place]) 10/31/2017  . Esophageal varices ([place])   . Fatty liver   . Gastric varices    takes Propranlol for this  . GERD (gastroesophageal reflux disease)    takes Nexium daily  . GI bleed june 2015  . QQVZDGLO(756.4)    "weekly" (10/18/2013)  . Hematemesis 10/18/2013   hospitalized  . Hepatitis yrs ago   hx of Hep A  . History of colon polyps   . History of staph infection 2014  . Hx of adenomatous colonic polyps 12/18/2017  . Hypertension    takes Atenolol,HCTZ,and Lisinopril daily  . Insomnia    but doesn't take any meds  . Iron deficiency anemia due to chronic blood loss 11/04/2017  . Liver cirrhosis secondary to NASH ([place]) + alcohol use? contribution 02/21/2011  . Neck pain    HNP and radiculpathy  . Neuromuscular disorder ([place])   . Numbness hands and fingers   and tingling  . Obesity   . Osteoarthritis    "hands; elbows; knees" (10/18/2013)  . Pancytopenia ([place])    but never had a blood transfusion, had to take iron transfusion  sept 2014  . Sleep apnea    sleep study in epic from 2006 but doesn't use a cpap (10/18/2013)  . Thrombocytopenia ([place]) in cirrhosis and portal hypertension 06/25/2016  . Type II diabetes mellitus (HCC)    takes Novolin and Novolog and takes Janumet daily  . Urinary frequency    Past Surgical History:  Procedure Laterality Date  . ANTERIOR CERVICAL DECOMP/DISCECTOMY FUSION N/A 05/30/2013   Procedure: Cervical five-six anterior cervical decompression with interbody prosthesis plating and bonegraft;  Surgeon:  Ophelia Charter, MD;  Location: [facility];  Service: Neurosurgery;  Laterality: N/A;  Cervical five-six anterior cervical decompression with interbody prosthesis plating and bonegraft  . COLONOSCOPY WITH PROPOFOL N/A 02/07/2013   Procedure: COLONOSCOPY WITH PROPOFOL;  Surgeon: Arta Silence, MD;  [facility]: [facility];  Service: Endoscopy;  Laterality: N/A;  . COLONOSCOPY WITH PROPOFOL N/A 03/06/2014   Procedure: COLONOSCOPY WITH PROPOFOL;  Surgeon: Arta Silence, MD;  Location: [facility];  Service: Endoscopy;  Laterality: N/A;  . ESOPHAGOGASTRODUODENOSCOPY N/A 10/19/2013   Procedure: ESOPHAGOGASTRODUODENOSCOPY (EGD);  Surgeon: Jeryl Columbia, MD;  Location: [facility];  Service: Endoscopy;  Laterality:  N/A;  . ESOPHAGOGASTRODUODENOSCOPY (EGD) WITH PROPOFOL N/A 02/07/2013   Procedure: ESOPHAGOGASTRODUODENOSCOPY (EGD) WITH PROPOFOL;  Surgeon: Arta Silence, MD;  [facility];  Service: Endoscopy;  Laterality: N/A;  . ESOPHAGOGASTRODUODENOSCOPY (EGD) WITH PROPOFOL N/A 03/06/2014   Procedure: ESOPHAGOGASTRODUODENOSCOPY (EGD) WITH PROPOFOL;  Surgeon: Arta Silence, MD;  [facility];  Service: Endoscopy;  Laterality: N/A;  . ESOPHAGOGASTRODUODENOSCOPY (EGD) WITH PROPOFOL N/A 06/25/2016   Procedure: ESOPHAGOGASTRODUODENOSCOPY (EGD) WITH PROPOFOL;  Surgeon: Gatha Mayer, MD;  [facility]: [facility];  Service: Endoscopy;  Laterality: N/A;  . EUS N/A 03/06/2014   Procedure: ESOPHAGEAL ENDOSCOPIC ULTRASOUND (EUS) RADIAL;  Surgeon: Arta Silence, MD;  [facility]: [facility];  Service: Endoscopy;  Laterality: N/A;  . FLEXIBLE SIGMOIDOSCOPY N/A 12/14/2017   Procedure: FLEXIBLE SIGMOIDOSCOPY ;  Surgeon: Irving Copas., MD;  Location: [facility];  Service: Gastroenterology;  Laterality: N/A;  . HOT HEMOSTASIS N/A 12/14/2017   Procedure: HEMOSTASIS CLIPS;  Surgeon: Rush Landmark Telford Nab., MD;  Location: [facility];  Service: Gastroenterology;  Laterality: N/A;  .  INCISION AND DRAINAGE MOUTH  02/2013   " jaw area"  . 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   Review of Systems As above  Objective:   Physical Exam BP 120/70 (BP Location: Left Arm, Patient Position: Sitting, Cuff Size: Normal)   Pulse 72   Ht 5' 7.5" (1.715 m) Comment: height measured without shoes  Wt 299 lb 2 oz (135.7 kg)   BMI 46.16 kg/m  Obese Hispanic man no acute distress Eyes are anicteric Lungs clear Heart sounds normal with S1-S2 no rubs murmurs or gallops Abdomen is morbidly obese soft nontender I do not see stigmata of chronic liver disease in his body

## 2020-05-29 DIAGNOSIS — M25571 Pain in right ankle and joints of right foot: Secondary | ICD-10-CM | POA: Diagnosis not present

## 2020-05-29 DIAGNOSIS — M25572 Pain in left ankle and joints of left foot: Secondary | ICD-10-CM | POA: Diagnosis not present

## 2020-06-04 ENCOUNTER — Telehealth: Payer: Self-pay | Admitting: Internal Medicine

## 2020-06-04 DIAGNOSIS — Z794 Long term (current) use of insulin: Secondary | ICD-10-CM | POA: Diagnosis not present

## 2020-06-04 DIAGNOSIS — E119 Type 2 diabetes mellitus without complications: Secondary | ICD-10-CM | POA: Diagnosis not present

## 2020-06-04 DIAGNOSIS — H25013 Cortical age-related cataract, bilateral: Secondary | ICD-10-CM | POA: Diagnosis not present

## 2020-06-04 DIAGNOSIS — H2513 Age-related nuclear cataract, bilateral: Secondary | ICD-10-CM | POA: Diagnosis not present

## 2020-06-04 MED ORDER — LISINOPRIL 20 MG PO TABS: 20.0000 mg | ORAL_TABLET | Freq: Every day | ORAL | 3 refills | Status: DC

## 2020-06-04 MED ORDER — HYDROCHLOROTHIAZIDE 25 MG PO TABS: 25.0000 mg | ORAL_TABLET | Freq: Every morning | ORAL | 3 refills | Status: DC

## 2020-06-04 NOTE — Telephone Encounter (Signed)
Received Fax RX request from  [facility], [address] [place], [address] Phone:  [phone]  Fax:  [phone]       Medication - hydrochlorothiazide (HYDRODIURIL) 25 MG tablet lisinopril (ZESTRIL) 20 MG tablet   Last Refill -  Last OV - 02/19/2020  Last CPE - 02/19/2020  Next Appointment -

## 2020-06-04 NOTE — Telephone Encounter (Signed)
Refill x one year on both

## 2020-06-06 ENCOUNTER — Other Ambulatory Visit: Payer: Self-pay | Admitting: Internal Medicine

## 2020-06-09 NOTE — Progress Notes (Signed)
Attempted to obtain medical history via telephone, unable to reach at this time. I left a voicemail to return pre surgical testing department's phone call.  

## 2020-06-11 ENCOUNTER — Encounter (HOSPITAL_COMMUNITY): Payer: Self-pay | Admitting: Internal Medicine

## 2020-06-11 ENCOUNTER — Other Ambulatory Visit: Payer: Self-pay

## 2020-06-12 ENCOUNTER — Other Ambulatory Visit (HOSPITAL_COMMUNITY)
Admission: RE | Admit: 2020-06-12 | Discharge: 2020-06-12 | Disposition: A | Discharge status: Home / Self Care | Payer: BC Managed Care – PPO | Source: Ambulatory Visit | Attending: Internal Medicine | Admitting: Internal Medicine

## 2020-06-12 DIAGNOSIS — Z01812 Encounter for preprocedural laboratory examination: Secondary | ICD-10-CM | POA: Insufficient documentation

## 2020-06-12 DIAGNOSIS — Z20822 Contact with and (suspected) exposure to covid-19: Secondary | ICD-10-CM | POA: Insufficient documentation

## 2020-06-12 LAB — SARS CORONAVIRUS 2 (TAT 6-24 HRS): SARS Coronavirus 2: NEGATIVE

## 2020-06-13 NOTE — Progress Notes (Signed)
Pre op call done for endo procedure on Monday 2/21. Information given to wife since pt tied up at the moment. Pt wife confirmed he has been quarantined and will stay so over weekend, will be npo morning of and has a ride home post procedure. All questions addressed.

## 2020-06-16 ENCOUNTER — Ambulatory Visit (HOSPITAL_COMMUNITY)
Admission: AD | Admit: 2020-06-16 | Discharge: 2020-06-16 | Disposition: A | Discharge status: Home / Self Care | Payer: BC Managed Care – PPO | Attending: Internal Medicine | Admitting: Internal Medicine

## 2020-06-16 ENCOUNTER — Encounter (HOSPITAL_COMMUNITY)
Admission: AD | Disposition: A | Discharge status: Home / Self Care | Payer: Self-pay | Source: Home / Self Care | Attending: Internal Medicine

## 2020-06-16 ENCOUNTER — Ambulatory Visit (HOSPITAL_COMMUNITY): Payer: BC Managed Care – PPO | Admitting: Anesthesiology

## 2020-06-16 ENCOUNTER — Other Ambulatory Visit: Payer: Self-pay

## 2020-06-16 ENCOUNTER — Encounter (HOSPITAL_COMMUNITY): Payer: Self-pay | Admitting: Internal Medicine

## 2020-06-16 DIAGNOSIS — K766 Portal hypertension: Secondary | ICD-10-CM | POA: Diagnosis not present

## 2020-06-16 DIAGNOSIS — Z79899 Other long term (current) drug therapy: Secondary | ICD-10-CM | POA: Insufficient documentation

## 2020-06-16 DIAGNOSIS — K3184 Gastroparesis: Secondary | ICD-10-CM | POA: Insufficient documentation

## 2020-06-16 DIAGNOSIS — K7469 Other cirrhosis of liver: Secondary | ICD-10-CM | POA: Diagnosis not present

## 2020-06-16 DIAGNOSIS — I8510 Secondary esophageal varices without bleeding: Secondary | ICD-10-CM | POA: Diagnosis not present

## 2020-06-16 DIAGNOSIS — K2951 Unspecified chronic gastritis with bleeding: Secondary | ICD-10-CM | POA: Diagnosis not present

## 2020-06-16 DIAGNOSIS — K2901 Acute gastritis with bleeding: Secondary | ICD-10-CM | POA: Insufficient documentation

## 2020-06-16 DIAGNOSIS — Z7984 Long term (current) use of oral hypoglycemic drugs: Secondary | ICD-10-CM | POA: Diagnosis not present

## 2020-06-16 DIAGNOSIS — K3189 Other diseases of stomach and duodenum: Secondary | ICD-10-CM | POA: Insufficient documentation

## 2020-06-16 DIAGNOSIS — K2950 Unspecified chronic gastritis without bleeding: Secondary | ICD-10-CM | POA: Diagnosis not present

## 2020-06-16 DIAGNOSIS — Z794 Long term (current) use of insulin: Secondary | ICD-10-CM | POA: Diagnosis not present

## 2020-06-16 DIAGNOSIS — K2900 Acute gastritis without bleeding: Secondary | ICD-10-CM | POA: Diagnosis not present

## 2020-06-16 DIAGNOSIS — K7581 Nonalcoholic steatohepatitis (NASH): Secondary | ICD-10-CM | POA: Insufficient documentation

## 2020-06-16 DIAGNOSIS — I8511 Secondary esophageal varices with bleeding: Secondary | ICD-10-CM | POA: Insufficient documentation

## 2020-06-16 DIAGNOSIS — K319 Disease of stomach and duodenum, unspecified: Secondary | ICD-10-CM | POA: Diagnosis not present

## 2020-06-16 DIAGNOSIS — E1143 Type 2 diabetes mellitus with diabetic autonomic (poly)neuropathy: Secondary | ICD-10-CM | POA: Insufficient documentation

## 2020-06-16 DIAGNOSIS — K920 Hematemesis: Secondary | ICD-10-CM

## 2020-06-16 HISTORY — PX: ESOPHAGOGASTRODUODENOSCOPY (EGD) WITH PROPOFOL: SHX5813

## 2020-06-16 HISTORY — PX: BIOPSY: SHX5522

## 2020-06-16 LAB — GLUCOSE, CAPILLARY: Glucose-Capillary: 234 mg/dL — ABNORMAL HIGH (ref 70–99)

## 2020-06-16 SURGERY — ESOPHAGOGASTRODUODENOSCOPY (EGD) WITH PROPOFOL
Anesthesia: Monitor Anesthesia Care

## 2020-06-16 MED ORDER — PROPOFOL 10 MG/ML IV BOLUS
INTRAVENOUS | Status: DC | PRN
  Administered 2020-06-16 (×3): 10 mg via INTRAVENOUS

## 2020-06-16 MED ORDER — SODIUM CHLORIDE 0.9 % IV SOLN: INTRAVENOUS | Status: DC

## 2020-06-16 MED ORDER — LIDOCAINE HCL 1 % IJ SOLN
INTRAMUSCULAR | Status: DC | PRN
  Administered 2020-06-16: 50 mg via INTRADERMAL

## 2020-06-16 MED ORDER — ESOMEPRAZOLE MAGNESIUM 20 MG PO CPDR: 20.0000 mg | DELAYED_RELEASE_CAPSULE | Freq: Every day | ORAL | Status: DC

## 2020-06-16 MED ORDER — LACTATED RINGERS IV SOLN: Freq: Once | INTRAVENOUS | Status: AC

## 2020-06-16 MED ORDER — ESOMEPRAZOLE MAGNESIUM 20 MG PO CPDR: 40.0000 mg | DELAYED_RELEASE_CAPSULE | Freq: Every day | ORAL | Status: AC

## 2020-06-16 MED ORDER — PROPOFOL 500 MG/50ML IV EMUL
INTRAVENOUS | Status: DC | PRN
  Administered 2020-06-16: 110 ug/kg/min via INTRAVENOUS

## 2020-06-16 MED ORDER — PROPOFOL 1000 MG/100ML IV EMUL
INTRAVENOUS | Status: AC
  Filled 2020-06-16: qty 100

## 2020-06-16 SURGICAL SUPPLY — 14 items

## 2020-06-16 NOTE — Transfer of Care (Signed)
Immediate Anesthesia Transfer of Care Note  Patient: Oscar [name]  Procedure(s) Performed: ESOPHAGOGASTRODUODENOSCOPY (EGD) WITH PROPOFOL (N/A ) BIOPSY  Patient Location: PACU and Endoscopy Unit  Anesthesia Type:MAC  Level of Consciousness: awake, alert , oriented and patient cooperative  Airway & Oxygen Therapy: Patient Spontanous Breathing and Patient connected to face mask oxygen  Post-op Assessment: Report given to RN and Post -op Vital signs reviewed and stable  Post vital signs: Reviewed and stable  Last Vitals:  Vitals Value Taken Time  BP    Temp    Pulse    Resp    SpO2      Last Pain:  Vitals:   06/16/20 0804  TempSrc: Oral  PainSc: 0-No pain         Complications: No complications documented.

## 2020-06-16 NOTE — Anesthesia Postprocedure Evaluation (Signed)
Anesthesia Post Note  Patient: Oscar [name]  Procedure(s) Performed: ESOPHAGOGASTRODUODENOSCOPY (EGD) WITH PROPOFOL (N/A ) BIOPSY     Patient location during evaluation: PACU Anesthesia Type: MAC Level of consciousness: awake and alert Pain management: pain level controlled Vital Signs Assessment: post-procedure vital signs reviewed and stable Respiratory status: spontaneous breathing, nonlabored ventilation, respiratory function stable and patient connected to nasal cannula oxygen Cardiovascular status: stable and blood pressure returned to baseline Postop Assessment: no apparent nausea or vomiting Anesthetic complications: no   No complications documented.  Last Vitals:  Vitals:   06/16/20 1000 06/16/20 1011  BP: 134/70 (!) 135/56  Pulse: 63 61  Resp: (!) 28 (!) 21  Temp:    SpO2: 99% 98%    Last Pain:  Vitals:   06/16/20 1011  TempSrc:   PainSc: 0-No pain                 Effie Berkshire

## 2020-06-16 NOTE — Interval H&P Note (Signed)
History and Physical Interval Note:  06/16/2020 9:19 AM  Oscar [name]  has presented today for surgery, with the diagnosis of hematemesis with nausea.  The various methods of treatment have been discussed with the patient and family. After consideration of risks, benefits and other options for treatment, the patient has consented to  Procedure(s): ESOPHAGOGASTRODUODENOSCOPY (EGD) WITH PROPOFOL (N/A) as a surgical intervention.  The patient's history has been reviewed, patient examined, no change in status, stable for surgery.  I have reviewed the patient's chart and labs.  Questions were answered to the patient's satisfaction.     Silvano Rusk

## 2020-06-16 NOTE — Op Note (Signed)
[facility] Patient Name: Oscar [name] Procedure Date: 06/16/2020 MRN: [id number] Attending MD: Gatha Mayer , MD Date of Birth: [birth year] CSN: [id number] Age: 55 Admit Type: Outpatient Procedure:                Upper GI endoscopy Indications:              Hematemesis, Esophageal varices, Follow-up of                            esophageal varices Providers:                Gatha Mayer, MD, Cleda Daub, RN, Lesia Sago, Technician Referring MD:              Medicines:                Propofol per Anesthesia, Monitored Anesthesia Care Complications:            No immediate complications. Estimated Blood Loss:     Estimated blood loss was minimal. Procedure:                Pre-Anesthesia Assessment:                           - Prior to the procedure, a History and Physical                            was performed, and patient medications and                            allergies were reviewed. The patient's tolerance of                            previous anesthesia was also reviewed. The risks                            and benefits of the procedure and the sedation                            options and risks were discussed with the patient.                            All questions were answered, and informed consent                            was obtained. Prior Anticoagulants: The patient has                            taken no previous anticoagulant or antiplatelet                            agents. ASA Grade Assessment: III - A patient with  severe systemic disease. After reviewing the risks                            and benefits, the patient was deemed in                            satisfactory condition to undergo the procedure.                           After obtaining informed consent, the endoscope was                            passed under direct vision. Throughout the                             procedure, the patient's blood pressure, pulse, and                            oxygen saturations were monitored continuously. The                            GIF-H190 ([id number]) Olympus gastroscope was                            introduced through the mouth, and advanced to the                            second part of duodenum. The upper GI endoscopy was                            accomplished without difficulty. The patient                            tolerated the procedure well. Scope In: Scope Out: Findings:      Grade II, small (< 5 mm) varices were found in the lower third of the       esophagus. They were 4 mm in largest diameter.      Mild, moderate, diffuse portal hypertensive gastropathy was found in the       entire examined stomach.      Multiple small papules (nodules) were found in the prepyloric region of       the stomach. Biopsies were taken with a cold forceps for histology.       Verification of patient identification for the specimen was done.       Estimated blood loss was minimal.      The exam was otherwise without abnormality.      The cardia and gastric fundus were otherwise normal on retroflexion. Impression:               - Grade II and small (< 5 mm) esophageal varices.                           - Portal hypertensive gastropathy.                           -  Multiple eroded papules (nodules) found in the                            stomach. Biopsied.                           - The examination was otherwise normal. Things look                            very similar save for erosions on pre-pyloric                            nodules. i suspect the minor hematemesis was from                            these. Moderate Sedation:      Not Applicable - Patient had care per Anesthesia. Recommendation:           - Patient has a contact number available for                            emergencies. The signs and symptoms of potential                             delayed complications were discussed with the                            patient. Return to normal activities tomorrow.                            Written discharge instructions were provided to the                            patient.                           - Resume previous diet.                           - Continue present medications.                           - Await pathology results. Procedure Code(s):        --- Professional ---                           [phone], Esophagogastroduodenoscopy, flexible,                            transoral; with biopsy, single or multiple Diagnosis Code(s):        --- Professional ---                           I85.00, Esophageal varices without bleeding                           K76.6, Portal hypertension  K31.89, Other diseases of stomach and duodenum                           K92.0, Hematemesis CPT copyright 2019 [employer]. All rights reserved. The codes documented in this report are preliminary and upon coder review may  be revised to meet current compliance requirements. Gatha Mayer, MD 06/16/2020 9:53:35 AM This report has been signed electronically. Number of Addenda: 0

## 2020-06-16 NOTE — Discharge Instructions (Addendum)
Things look the same overall except that the nodules or polyps in the stomach look inflamed and I bet that is where the bleeding came from. I took biopsies to understand this better.    I appreciate the opportunity to care for you. Gatha Mayer, MD, FACG   YOU HAD AN ENDOSCOPIC PROCEDURE TODAY: Refer to the procedure report and other information in the discharge instructions given to you for any specific questions about what was found during the examination. If this information does not answer your questions, please call Dr. Celesta Aver office at [phone] to clarify.   YOU SHOULD EXPECT: Some feelings of bloating in the abdomen. Passage of more gas than usual. Walking can help get rid of the air that was put into your GI tract during the procedure and reduce the bloating. If you had a lower endoscopy (such as a colonoscopy or flexible sigmoidoscopy) you may notice spotting of blood in your stool or on the toilet paper. Some abdominal soreness may be present for a day or two, also.  DIET: Your first meal following the procedure should be a light meal and then it is ok to progress to your normal diet. A half-sandwich or bowl of soup is an example of a good first meal. Heavy or fried foods are harder to digest and may make you feel nauseous or bloated. Drink plenty of fluids but you should avoid alcoholic beverages for 24 hours.   ACTIVITY: Your care partner should take you home directly after the procedure. You should plan to take it easy, moving slowly for the rest of the day. You can resume normal activity the day after the procedure however YOU SHOULD NOT DRIVE, use power tools, machinery or perform tasks that involve climbing or major physical exertion for 24 hours (because of the sedation medicines used during the test).   SYMPTOMS TO REPORT IMMEDIATELY: A gastroenterologist can be reached at any hour. Please call [phone]  for any of the following symptoms:   Following upper endoscopy  (EGD, EUS, ERCP, esophageal dilation) Vomiting of blood or coffee ground material  New, significant abdominal pain  New, significant chest pain or pain under the shoulder blades  Painful or persistently difficult swallowing  New shortness of breath  Black, tarry-looking or red, bloody stools

## 2020-06-16 NOTE — Anesthesia Preprocedure Evaluation (Signed)
Anesthesia Evaluation  Patient identified by MRN, date of birth, ID band Patient awake    Reviewed: Allergy & Precautions, NPO status , Patient's Chart, lab work & pertinent test results  Airway Mallampati: III  TM Distance: >3 FB Neck ROM: Full    Dental  (+) Teeth Intact, Dental Advisory Given   Pulmonary sleep apnea ,    breath sounds clear to auscultation       Cardiovascular hypertension, Pt. on medications and Pt. on home beta blockers  Rhythm:Regular Rate:Normal     Neuro/Psych  Headaches, negative psych ROS   GI/Hepatic GERD  Medicated,(+) Cirrhosis   Esophageal Varices    , Hepatitis -, Unspecified, A  Endo/Other  diabetes, Type 2, Oral Hypoglycemic Agents, Insulin Dependent  Renal/GU negative Renal ROS     Musculoskeletal  (+) Arthritis ,   Abdominal Normal abdominal exam  (+)   Peds  Hematology   Anesthesia Other Findings   Reproductive/Obstetrics                             Anesthesia Physical Anesthesia Plan  ASA: III  Anesthesia Plan: MAC   Post-op Pain Management:    Induction: Intravenous  PONV Risk Score and Plan: 0 and Propofol infusion  Airway Management Planned: Natural Airway and Simple Face Mask  Additional Equipment: None  Intra-op Plan:   Post-operative Plan:   Informed Consent: I have reviewed the patients History and Physical, chart, labs and discussed the procedure including the risks, benefits and alternatives for the proposed anesthesia with the patient or authorized representative who has indicated his/her understanding and acceptance.       Plan Discussed with: CRNA  Anesthesia Plan Comments: (Lab Results      Component                Value               Date                      WBC                      3.6 (L)             05/26/2020                HGB                      16.1                05/26/2020                HCT                       45.9                05/26/2020                MCV                      94.0                05/26/2020                PLT                      103.0 (L)  05/26/2020           )        Anesthesia Quick Evaluation

## 2020-06-17 LAB — SURGICAL PATHOLOGY

## 2020-06-18 ENCOUNTER — Encounter (HOSPITAL_COMMUNITY): Payer: Self-pay | Admitting: Internal Medicine

## 2020-06-26 DIAGNOSIS — M722 Plantar fascial fibromatosis: Secondary | ICD-10-CM | POA: Diagnosis not present

## 2020-07-01 DIAGNOSIS — M722 Plantar fascial fibromatosis: Secondary | ICD-10-CM | POA: Diagnosis not present

## 2020-07-09 DIAGNOSIS — M722 Plantar fascial fibromatosis: Secondary | ICD-10-CM | POA: Diagnosis not present

## 2020-07-14 DIAGNOSIS — M722 Plantar fascial fibromatosis: Secondary | ICD-10-CM | POA: Diagnosis not present

## 2020-07-16 DIAGNOSIS — M722 Plantar fascial fibromatosis: Secondary | ICD-10-CM | POA: Diagnosis not present

## 2020-07-22 DIAGNOSIS — M722 Plantar fascial fibromatosis: Secondary | ICD-10-CM | POA: Diagnosis not present

## 2020-07-30 ENCOUNTER — Other Ambulatory Visit: Payer: Self-pay | Admitting: Internal Medicine

## 2020-09-02 DIAGNOSIS — K769 Liver disease, unspecified: Secondary | ICD-10-CM | POA: Diagnosis not present

## 2020-09-02 DIAGNOSIS — E1165 Type 2 diabetes mellitus with hyperglycemia: Secondary | ICD-10-CM | POA: Diagnosis not present

## 2020-09-02 DIAGNOSIS — E1140 Type 2 diabetes mellitus with diabetic neuropathy, unspecified: Secondary | ICD-10-CM | POA: Diagnosis not present

## 2020-09-02 DIAGNOSIS — I10 Essential (primary) hypertension: Secondary | ICD-10-CM | POA: Diagnosis not present

## 2020-09-22 ENCOUNTER — Other Ambulatory Visit: Payer: Self-pay | Admitting: Internal Medicine

## 2020-11-18 ENCOUNTER — Telehealth: Payer: BC Managed Care – PPO

## 2020-11-18 NOTE — Telephone Encounter (Signed)
Patient called has a boil/cyst on the right butt cheek it is painful swollen it is hard to sit. He would like to come in so you can look at it. He wants to come in tomorrow since he lives 40 miles away.   [phone]

## 2020-11-18 NOTE — Telephone Encounter (Signed)
Pt was notified of Dr. Verlene Mayer recommendations, he said he will go to an urgent care.

## 2020-11-19 DIAGNOSIS — L02215 Cutaneous abscess of perineum: Secondary | ICD-10-CM | POA: Diagnosis not present

## 2020-12-31 DIAGNOSIS — E7800 Pure hypercholesterolemia, unspecified: Secondary | ICD-10-CM | POA: Diagnosis not present

## 2020-12-31 DIAGNOSIS — E669 Obesity, unspecified: Secondary | ICD-10-CM | POA: Diagnosis not present

## 2020-12-31 DIAGNOSIS — E1165 Type 2 diabetes mellitus with hyperglycemia: Secondary | ICD-10-CM | POA: Diagnosis not present

## 2021-01-05 DIAGNOSIS — I10 Essential (primary) hypertension: Secondary | ICD-10-CM | POA: Diagnosis not present

## 2021-01-05 DIAGNOSIS — K769 Liver disease, unspecified: Secondary | ICD-10-CM | POA: Diagnosis not present

## 2021-01-05 DIAGNOSIS — E7800 Pure hypercholesterolemia, unspecified: Secondary | ICD-10-CM | POA: Diagnosis not present

## 2021-01-05 DIAGNOSIS — E1165 Type 2 diabetes mellitus with hyperglycemia: Secondary | ICD-10-CM | POA: Diagnosis not present

## 2021-01-12 ENCOUNTER — Telehealth: Payer: Self-pay | Admitting: Internal Medicine

## 2021-01-12 DIAGNOSIS — K7469 Other cirrhosis of liver: Secondary | ICD-10-CM

## 2021-01-12 NOTE — Telephone Encounter (Signed)
Inbound call from pt's wife requesting a call back stating that she received a letter saying that it was time to schedule her husband's MRI. Please advise. Thank you

## 2021-01-13 NOTE — Telephone Encounter (Signed)
I reviewed the MRI results from 12/28/2019 and the patient needs [place] not MRI.  I left a message for the patient or his wife to call back to discuss further.

## 2021-01-14 NOTE — Telephone Encounter (Signed)
Patient's wife notified she understands that they will be contacted directly by [facility] Radiology Scheduling to arrange ultrasound of the abdomen

## 2021-01-16 ENCOUNTER — Other Ambulatory Visit: Payer: Self-pay | Admitting: Internal Medicine

## 2021-01-23 ENCOUNTER — Other Ambulatory Visit: Payer: Self-pay | Admitting: Internal Medicine

## 2021-02-19 ENCOUNTER — Other Ambulatory Visit: Payer: Self-pay

## 2021-02-19 ENCOUNTER — Other Ambulatory Visit: Payer: BC Managed Care – PPO | Admitting: Internal Medicine

## 2021-02-19 DIAGNOSIS — Z6841 Body Mass Index (BMI) 40.0 and over, adult: Secondary | ICD-10-CM | POA: Diagnosis not present

## 2021-02-19 DIAGNOSIS — K3184 Gastroparesis: Secondary | ICD-10-CM | POA: Diagnosis not present

## 2021-02-19 DIAGNOSIS — I10 Essential (primary) hypertension: Secondary | ICD-10-CM | POA: Diagnosis not present

## 2021-02-19 DIAGNOSIS — E1143 Type 2 diabetes mellitus with diabetic autonomic (poly)neuropathy: Secondary | ICD-10-CM

## 2021-02-19 DIAGNOSIS — Z125 Encounter for screening for malignant neoplasm of prostate: Secondary | ICD-10-CM

## 2021-02-20 LAB — CBC WITH DIFFERENTIAL/PLATELET
Absolute Monocytes: 215 cells/uL (ref 200–950)
Basophils Absolute: 12 cells/uL (ref 0–200)
Basophils Relative: 0.4 %
Eosinophils Absolute: 41 cells/uL (ref 15–500)
Eosinophils Relative: 1.4 %
HCT: 46.4 % (ref 38.5–50.0)
Hemoglobin: 16.3 g/dL (ref 13.2–17.1)
Lymphs Abs: 876 cells/uL (ref 850–3900)
MCH: 34.3 pg — ABNORMAL HIGH (ref 27.0–33.0)
MCHC: 35.1 g/dL (ref 32.0–36.0)
MCV: 97.7 fL (ref 80.0–100.0)
MPV: 10.5 fL (ref 7.5–12.5)
Monocytes Relative: 7.4 %
Neutro Abs: 1757 cells/uL (ref 1500–7800)
Neutrophils Relative %: 60.6 %
Platelets: 109 10*3/uL — ABNORMAL LOW (ref 140–400)
RBC: 4.75 10*6/uL (ref 4.20–5.80)
RDW: 14.2 % (ref 11.0–15.0)
Total Lymphocyte: 30.2 %
WBC: 2.9 10*3/uL — ABNORMAL LOW (ref 3.8–10.8)

## 2021-02-20 LAB — HEMOGLOBIN A1C
Hgb A1c MFr Bld: 6.6 % of total Hgb — ABNORMAL HIGH (ref <5.7)
Mean Plasma Glucose: 143 mg/dL
eAG (mmol/L): 7.9 mmol/L

## 2021-02-20 LAB — LIPID PANEL
Cholesterol: 143 mg/dL (ref <200)
HDL: 48 mg/dL (ref >=40)
LDL Cholesterol (Calc): 77 mg/dL (calc)
Non-HDL Cholesterol (Calc): 95 mg/dL (calc) (ref <130)
Total CHOL/HDL Ratio: 3 (calc) (ref <5.0)
Triglycerides: 98 mg/dL (ref <150)

## 2021-02-20 LAB — COMPLETE METABOLIC PANEL WITH GFR
AG Ratio: 1.6 (calc) (ref 1.0–2.5)
ALT: 28 U/L (ref 9–46)
AST: 29 U/L (ref 10–35)
Albumin: 4 g/dL (ref 3.6–5.1)
Alkaline phosphatase (APISO): 84 U/L (ref 35–144)
BUN: 15 mg/dL (ref 7–25)
CO2: 27 mmol/L (ref 20–32)
Calcium: 9.8 mg/dL (ref 8.6–10.3)
Chloride: 105 mmol/L (ref 98–110)
Creat: 0.84 mg/dL (ref 0.70–1.30)
Globulin: 2.5 g/dL (calc) (ref 1.9–3.7)
Glucose, Bld: 143 mg/dL — ABNORMAL HIGH (ref 65–99)
Potassium: 4.1 mmol/L (ref 3.5–5.3)
Sodium: 141 mmol/L (ref 135–146)
Total Bilirubin: 1.2 mg/dL (ref 0.2–1.2)
Total Protein: 6.5 g/dL (ref 6.1–8.1)
eGFR: 104 mL/min/{1.73_m2} (ref >=60)

## 2021-02-20 LAB — MICROALBUMIN / CREATININE URINE RATIO
Creatinine, Urine: 45 mg/dL (ref 20–320)
Microalb Creat Ratio: 13 mcg/mg creat (ref <30)
Microalb, Ur: 0.6 mg/dL

## 2021-02-20 LAB — PSA: PSA: 0.37 ng/mL (ref <=4.00)

## 2021-02-24 ENCOUNTER — Ambulatory Visit (INDEPENDENT_AMBULATORY_CARE_PROVIDER_SITE_OTHER): Payer: BC Managed Care – PPO | Admitting: Internal Medicine

## 2021-02-24 ENCOUNTER — Other Ambulatory Visit: Payer: Self-pay

## 2021-02-24 ENCOUNTER — Encounter: Payer: Self-pay | Admitting: Internal Medicine

## 2021-02-24 VITALS — BP 118/68 | HR 72 | Temp 98.9°F | Ht 68.0 in | Wt 293.0 lb

## 2021-02-24 DIAGNOSIS — I10 Essential (primary) hypertension: Secondary | ICD-10-CM

## 2021-02-24 DIAGNOSIS — D708 Other neutropenia: Secondary | ICD-10-CM

## 2021-02-24 DIAGNOSIS — Z6841 Body Mass Index (BMI) 40.0 and over, adult: Secondary | ICD-10-CM

## 2021-02-24 DIAGNOSIS — Z23 Encounter for immunization: Secondary | ICD-10-CM

## 2021-02-24 DIAGNOSIS — I8510 Secondary esophageal varices without bleeding: Secondary | ICD-10-CM

## 2021-02-24 DIAGNOSIS — Z8639 Personal history of other endocrine, nutritional and metabolic disease: Secondary | ICD-10-CM | POA: Diagnosis not present

## 2021-02-24 DIAGNOSIS — Z0000 Encounter for general adult medical examination without abnormal findings: Secondary | ICD-10-CM | POA: Diagnosis not present

## 2021-02-24 DIAGNOSIS — D696 Thrombocytopenia, unspecified: Secondary | ICD-10-CM

## 2021-02-24 DIAGNOSIS — K7030 Alcoholic cirrhosis of liver without ascites: Secondary | ICD-10-CM

## 2021-02-24 DIAGNOSIS — K219 Gastro-esophageal reflux disease without esophagitis: Secondary | ICD-10-CM

## 2021-02-24 DIAGNOSIS — Z86010 Personal history of colonic polyps: Secondary | ICD-10-CM

## 2021-02-24 DIAGNOSIS — K7460 Unspecified cirrhosis of liver: Secondary | ICD-10-CM

## 2021-02-24 DIAGNOSIS — Z8719 Personal history of other diseases of the digestive system: Secondary | ICD-10-CM

## 2021-02-24 LAB — POCT URINALYSIS DIP (CLINITEK)
Bilirubin, UA: NEGATIVE
Blood, UA: NEGATIVE
Glucose, UA: >=1000 mg/dL — AB
Ketones, POC UA: NEGATIVE mg/dL
Leukocytes, UA: NEGATIVE
Nitrite, UA: NEGATIVE
POC PROTEIN,UA: NEGATIVE
Spec Grav, UA: 1.01 (ref 1.010–1.025)
Urobilinogen, UA: NEGATIVE E.U./dL — AB
pH, UA: 6 (ref 5.0–8.0)

## 2021-02-24 NOTE — Progress Notes (Signed)
Subjective:    Patient ID: Oscar [name], male    DOB: [birth year], 55 y.o.   MRN: [id number]  HPI 55 year old Male seen today for health maintenance exam and evaluation of medical issues.  He has history of many medical issues including insulin-dependent diabetes mellitus treated by Dr. Chalmers Cater, Endocrinologist, history of leukopenia and thrombocytopenia.  Has seen Dr. Marin Olp in the past for this.  History of cirrhosis and esophageal varices.  History of obstructive sleep apnea, morbid obesity and GE reflux.  Multiple admissions in the past for GI bleeding but none recently.  He had endoscopy in 2018 for esophageal varices monitoring.  No varices noted in the proximal stomach.  He had grade 2 less than 5 mm esophageal varices.  Colonoscopy was performed and 1 polyp removed.  Post polypectomy, he had bleeding due to liver disease.  Hemostasis was achieved after placement of clips.  He had endoscopy June 16, 2020 showing grade 2 small less than 5 mm varices were found in the lower third of the esophagus.  They were 4 mm in the largest diameter.  He had diffuse portal hypertensive gastropathy in the entire stomach which was mild to moderate.  He had multiple eroded papules/nodules in the stomach which were biopsied.  Pathology showed these to be reactive gastropathy with hyperplastic changes, acute and chronic inflammation.  No intestinal metaplasia or H. pylori identified.  In 2015 he had C5-C6 anterior discectomy by Dr. Arnoldo Morale.  Gastroenterologist is Dr. Carlean Purl.  History of sleep apnea treated by Dr. Annamaria Boots in 2006 with CPAP but has not been using it regularly.  He is to see Dr. Bing Plume for eye exam but now sees physician in [facility] who is on his insurance plan.  No known drug allergies.  Peripheral neuropathy.  Takes PPI.  He has insulin-dependent diabetes and hemoglobin A1c is fairly well controlled at 6.6%.  4 years ago it was 9%.  Social history: He is married.  Says he  no longer drinks alcohol or uses drugs.  Non-smoker.  No children.  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  He continues to work in a warehouse.  He is on his feet a lot.    Review of Systems denies chest pain or shortness of breath.     Objective:   Physical Exam Blood pressure 118/68 pulse 72 temperature 98.9 degrees pulse oximetry 94% weight 293 pounds height 5 feet 8 inches BMI 44.55  Skin: Warm and dry.  Nodes none.  Neck is supple without JVD thyromegaly or carotid bruits.  Chest is clear to auscultation.  Cardiac exam: Regular rate and rhythm without ectopy.  Abdomen is obese soft nondistended without hepatosplenomegaly masses or tenderness being appreciated.  Trace lower extremity pitting edema.  Prostate is normal.  Neurological exam shows no gross focal deficits on brief neurological exam.  He is alert and oriented x3       Assessment & Plan:  Insulin-dependent diabetes mellitus monitored by endocrinology  Essential hypertension-stable on current regimen  History of cirrhosis  History of esophageal varices  History of thrombocytopenia and leukopenia related to alcohol abuse and has been seen by Dr. Marin Olp  GE reflux treated with PPI  History of gastroparesis likely related to diabetes.  Has been tried on Reglan in the past.  History of fatty liver  Morbid obesity  History of multiple admissions for GI bleeding  History  of alcohol abuse but not drinking at the present time  History of adenomatous colon polyp  Plan: He will continue with current medications and follow-up in 1 year or as needed.  No change in his medications at the present time.  Flu vaccine given.

## 2021-02-24 NOTE — Progress Notes (Deleted)
ct 

## 2021-02-24 NOTE — Patient Instructions (Signed)
It was a pleasure to see you today.  Continue current medications and return in 1 year or as needed.  Flu vaccine given.

## 2021-02-26 ENCOUNTER — Other Ambulatory Visit: Payer: Self-pay

## 2021-02-26 ENCOUNTER — Ambulatory Visit (HOSPITAL_COMMUNITY)
Admission: RE | Admit: 2021-02-26 | Discharge: 2021-02-26 | Disposition: A | Discharge status: Home / Self Care | Payer: BC Managed Care – PPO | Source: Ambulatory Visit | Attending: Internal Medicine | Admitting: Internal Medicine

## 2021-02-26 DIAGNOSIS — K7469 Other cirrhosis of liver: Secondary | ICD-10-CM | POA: Diagnosis not present

## 2021-02-26 DIAGNOSIS — R161 Splenomegaly, not elsewhere classified: Secondary | ICD-10-CM | POA: Diagnosis not present

## 2021-03-11 ENCOUNTER — Other Ambulatory Visit: Payer: Self-pay | Admitting: Internal Medicine

## 2021-03-24 ENCOUNTER — Other Ambulatory Visit: Payer: Self-pay | Admitting: Internal Medicine

## 2021-04-09 ENCOUNTER — Other Ambulatory Visit: Payer: Self-pay | Admitting: Internal Medicine

## 2021-04-23 ENCOUNTER — Encounter: Payer: Self-pay | Admitting: Internal Medicine

## 2021-04-28 ENCOUNTER — Ambulatory Visit: Payer: BC Managed Care – PPO | Admitting: Internal Medicine

## 2021-05-05 DIAGNOSIS — E1165 Type 2 diabetes mellitus with hyperglycemia: Secondary | ICD-10-CM | POA: Diagnosis not present

## 2021-05-05 DIAGNOSIS — Z23 Encounter for immunization: Secondary | ICD-10-CM | POA: Diagnosis not present

## 2021-06-24 DIAGNOSIS — E119 Type 2 diabetes mellitus without complications: Secondary | ICD-10-CM | POA: Diagnosis not present

## 2021-06-24 DIAGNOSIS — H25013 Cortical age-related cataract, bilateral: Secondary | ICD-10-CM | POA: Diagnosis not present

## 2021-06-24 DIAGNOSIS — H2513 Age-related nuclear cataract, bilateral: Secondary | ICD-10-CM | POA: Diagnosis not present

## 2021-06-24 DIAGNOSIS — Z794 Long term (current) use of insulin: Secondary | ICD-10-CM | POA: Diagnosis not present

## 2021-07-16 ENCOUNTER — Encounter: Payer: Self-pay | Admitting: Internal Medicine

## 2021-07-16 ENCOUNTER — Telehealth: Payer: Self-pay | Admitting: Internal Medicine

## 2021-07-16 MED ORDER — NADOLOL 20 MG PO TABS: 20.0000 mg | ORAL_TABLET | Freq: Every day | ORAL | 2 refills | Status: DC

## 2021-07-16 NOTE — Telephone Encounter (Signed)
Transferring Nad[facility]ol from [facility] to [facility] in [place] at pt request, MJB, MD ?

## 2021-07-20 ENCOUNTER — Telehealth: Payer: Self-pay | Admitting: Internal Medicine

## 2021-07-20 ENCOUNTER — Ambulatory Visit: Payer: BC Managed Care – PPO | Admitting: Internal Medicine

## 2021-07-20 NOTE — Telephone Encounter (Signed)
Good morning Dr. Carlean Purl, ? ? ?Patient called to reschedule appointment with you today at 2:30 due to a scheduling conflict.  ? ? ?Patient was rescheduled for 4/26 at 9:10.  ?

## 2021-07-20 NOTE — Telephone Encounter (Signed)
Okay no charge ?

## 2021-08-19 ENCOUNTER — Ambulatory Visit (INDEPENDENT_AMBULATORY_CARE_PROVIDER_SITE_OTHER): Payer: BC Managed Care – PPO | Admitting: Internal Medicine

## 2021-08-19 ENCOUNTER — Encounter: Payer: Self-pay | Admitting: Internal Medicine

## 2021-08-19 ENCOUNTER — Other Ambulatory Visit (INDEPENDENT_AMBULATORY_CARE_PROVIDER_SITE_OTHER): Payer: BC Managed Care – PPO

## 2021-08-19 VITALS — BP 118/66 | HR 67 | Ht 68.0 in | Wt 294.1 lb

## 2021-08-19 DIAGNOSIS — K7469 Other cirrhosis of liver: Secondary | ICD-10-CM | POA: Diagnosis not present

## 2021-08-19 DIAGNOSIS — K3184 Gastroparesis: Secondary | ICD-10-CM

## 2021-08-19 DIAGNOSIS — E1143 Type 2 diabetes mellitus with diabetic autonomic (poly)neuropathy: Secondary | ICD-10-CM | POA: Diagnosis not present

## 2021-08-19 DIAGNOSIS — Z86010 Personal history of colonic polyps: Secondary | ICD-10-CM

## 2021-08-19 DIAGNOSIS — K769 Liver disease, unspecified: Secondary | ICD-10-CM | POA: Diagnosis not present

## 2021-08-19 DIAGNOSIS — Z860101 Personal history of adenomatous and serrated colon polyps: Secondary | ICD-10-CM

## 2021-08-19 DIAGNOSIS — I8510 Secondary esophageal varices without bleeding: Secondary | ICD-10-CM

## 2021-08-19 LAB — CBC WITH DIFFERENTIAL/PLATELET
Basophils Absolute: 0 10*3/uL (ref 0.0–0.1)
Basophils Relative: 0.3 % (ref 0.0–3.0)
Eosinophils Absolute: 0.1 10*3/uL (ref 0.0–0.7)
Eosinophils Relative: 3.8 % (ref 0.0–5.0)
HCT: 44.5 % (ref 39.0–52.0)
Hemoglobin: 15.8 g/dL (ref 13.0–17.0)
Lymphocytes Relative: 34.7 % (ref 12.0–46.0)
Lymphs Abs: 1.2 10*3/uL (ref 0.7–4.0)
MCHC: 35.5 g/dL (ref 30.0–36.0)
MCV: 98.9 fl (ref 78.0–100.0)
Monocytes Absolute: 0.2 10*3/uL (ref 0.1–1.0)
Monocytes Relative: 6.9 % (ref 3.0–12.0)
Neutro Abs: 1.9 10*3/uL (ref 1.4–7.7)
Neutrophils Relative %: 54.3 % (ref 43.0–77.0)
Platelets: 118 10*3/uL — ABNORMAL LOW (ref 150.0–400.0)
RBC: 4.5 Mil/uL (ref 4.22–5.81)
RDW: 15 % (ref 11.5–15.5)
WBC: 3.6 10*3/uL — ABNORMAL LOW (ref 4.0–10.5)

## 2021-08-19 LAB — COMPREHENSIVE METABOLIC PANEL
ALT: 22 U/L (ref 0–53)
AST: 25 U/L (ref 0–37)
Albumin: 3.9 g/dL (ref 3.5–5.2)
Alkaline Phosphatase: 88 U/L (ref 39–117)
BUN: 13 mg/dL (ref 6–23)
CO2: 27 mEq/L (ref 19–32)
Calcium: 9.8 mg/dL (ref 8.4–10.5)
Chloride: 101 mEq/L (ref 96–112)
Creatinine, Ser: 0.77 mg/dL (ref 0.40–1.50)
GFR: 101.16 mL/min (ref >60.00)
Glucose, Bld: 107 mg/dL — ABNORMAL HIGH (ref 70–99)
Potassium: 3.8 mEq/L (ref 3.5–5.1)
Sodium: 137 mEq/L (ref 135–145)
Total Bilirubin: 2.1 mg/dL — ABNORMAL HIGH (ref 0.2–1.2)
Total Protein: 7.2 g/dL (ref 6.0–8.3)

## 2021-08-19 LAB — PROTIME-INR
INR: 1.2 ratio — ABNORMAL HIGH (ref 0.8–1.0)
Prothrombin Time: 13.4 s — ABNORMAL HIGH (ref 9.6–13.1)

## 2021-08-19 NOTE — Patient Instructions (Signed)
Your provider has requested that you go to the basement level for lab work before leaving today. Press "B" on the elevator. The lab is located at the first door on the left as you exit the elevator. ?  ?Due to recent changes in healthcare laws, you may see the results of your imaging and laboratory studies on MyChart before your provider has had a chance to review them.  We understand that in some cases there may be results that are confusing or concerning to you. Not all laboratory results come back in the same time frame and the provider may be waiting for multiple results in order to interpret others.  Please give [place] 48 hours in order for your provider to thoroughly review all the results before contacting the office for clarification of your results.  ? ?You have been scheduled for a colonoscopy. Please follow written instructions given to you at your visit today.  ?Please pick up your prep supplies at the pharmacy within the next 1-3 days. ?If you use inhalers (even only as needed), please bring them with you on the day of your procedure. ? ?You have been scheduled for an MRI at [place] on _______________. Your appointment time is ________________. Please make certain not to have anything to eat or drink 6 hours prior to your test. In addition, if you have any metal in your body, have a pacemaker or defibrillator, please be sure to let your ordering physician know. This test typically takes 45 minutes to 1 hour to complete. Should you need to reschedule, please call [phone] to do so. ? ?I appreciate the opportunity to care for you. ?Silvano Rusk, MD, [facility] ?

## 2021-08-19 NOTE — Progress Notes (Signed)
? ?Oscar [name] 56 y.o. 28-Jan-1966 [id number] ? ?Assessment & Plan:  ? ?Encounter Diagnoses  ?Name Primary?  ? NASH/ASH cirrhosis Yes  ? Liver lesion, left lobe   ? Secondary esophageal varices without bleeding ([place])   ? Diabetic gastroparesis associated with type 2 diabetes mellitus ([place])   ? Hx of adenomatous polyp of colon   ? ?Schedule colonoscopy for polyp surveillance. ? ?Continue nadolol for portal hypertension/varices. ? ?He has had some chronic upper abdominal discomfort which is probably related to gastric dysfunction with his gastroparesis and the [facility] may be aggravating that some as that delays gastric emptying.  He seems to tolerated I would not start Reglan for this.  I had meant to give him gastroparesis diet information but forgot to do so so we will send that by MyChart or mail. ? ?Further investigation of liver disease as below. ?Orders Placed This Encounter  ?Procedures  ? MR Abdomen W Wo Contrast  ? CBC with Differential/Platelet  ? Comprehensive metabolic panel  ? Protime-INR  ? AFP tumor marker  ? Ambulatory referral to Gastroenterology  ? ? ? ?Follow-up to be arranged pending the above. ? ?Subjective:  ? ?Chief Complaint: History of colon polyps time for colonoscopy, cirrhosis, upper abdominal pain ? ?HPI ?This 56 year old man is here for follow-up with his wife present, he has a history of cirrhosis of the liver thought due to NASH, on nadolol for portal gastropathy and small esophageal varices, delayed gastric emptying/gastroparesis and a history of adenomatous colon polyps with a 10 mm adenoma about 3 years ago.  He received his recall letter and recommendation for office visit.  Things are overall stable though he is having some flare of upper abdominal pain.  He is now on [facility] and things may be a bit more active with respect to upper abdominal discomfort.  This is a chronic recurrent problem and he does have known delayed gastric emptying in the setting of diabetes.   Wife asking about the possibility of using Reglan.  He does not really vomit and there is not much if any nausea reported. ? ?Minor hematemesis led to EGD in February 2022 he had stable small varices and portal gastropathy with some eroded papules/nodules in the antrum as before.  I did not think his bleeding was from varices.  Pathology showed reactive gastropathy. ? ?Colonoscopy 12/13/2017 with 4 polyps 1 was a 10 mm pedunculated adenoma he did have a post polypectomy bleed treated the next day, he had 3 diminutive hyperplastic transverse polyps as well.  Rectal varices also seen. ? ?In the past he has had a small 5 mm liver lesion that enhanced, it was not present on last MRI so in November 2022 I followed up with an ultrasound that did not show any significant new abnormalities though it showed his cirrhosis and splenomegaly. ? ?LFTs albumin normal back in October platelets remain mildly low around 100.  No labs since then.  A1c was 6.6 then. ? ?Wt Readings from Last 3 Encounters:  ?08/19/21 294 lb 2 oz (133.4 kg)  ?02/24/21 293 lb (132.9 kg)  ?06/16/20 280 lb (127 kg)  ? ? ?Allergies  ?Allergen Reactions  ? Byetta 10 Mcg Pen [Exenatide] Nausea And Vomiting  ? ?Current Meds  ?Medication Sig  ? acetaminophen (TYLENOL) 500 MG tablet Take 500-1,000 mg by mouth every 6 (six) hours as needed for moderate pain or headache.  ? Continuous Blood Gluc Sensor (FREESTYLE LIBRE 2 SENSOR) MISC Inject into the skin every 14 (  fourteen) days.  ? dapagliflozin propanediol (FARXIGA) 10 MG TABS tablet Take 10 mg by mouth daily.  ? Diclofenac Sodium (PENNSAID) 2 % SOLN Apply 1 application topically daily as needed (Foot pain).  ? esomeprazole (NEXIUM) 20 MG capsule Take 2 capsules (40 mg total) by mouth daily before breakfast.  ? gabapentin (NEURONTIN) 300 MG capsule TAKE 2 CAPSULES BY MOUTH 3 TIMES A DAY  ? hydrochlorothiazide (HYDRODIURIL) 25 MG tablet TAKE 1 TABLET BY MOUTH IN  THE MORNING  ? lisinopril (ZESTRIL) 20 MG tablet TAKE  1 TABLET BY MOUTH  DAILY  ? metFORMIN (GLUCOPHAGE-XR) 500 MG 24 hr tablet Take 1,000 mg by mouth 2 (two) times daily.  ? MOUNJARO 2.5 MG/0.5ML Pen SMARTSIG:2.5 Milligram(s) SUB-Q Once a Week  ? nadolol (CORGARD) 20 MG tablet Take 1 tablet (20 mg total) by mouth daily.  ? NOVOLOG MIX 70/30 FLEXPEN (70-30) 100 UNIT/ML FlexPen Inject 15-50 Units into the skin See admin instructions. Take 40 mg in the morning 15 mg at lunch and 50 units at dinner may adjust sliding scale  ? sodium chloride (OCEAN) 0.65 % SOLN nasal spray Place 2 sprays into both nostrils daily as needed for congestion.  ? ?Past Medical History:  ?Diagnosis Date  ? Allergy   ? Anemia   ? Cirrhosis ([place])   ? Clotting disorder ([place])   ? Constipation   ? related to pain meds and takes Colace daily  ? Diabetic gastroparesis associated with type 2 diabetes mellitus ([place]) 10/31/2017  ? Esophageal varices ([facility])   ? Fatty liver   ? Gastric varices   ? takes Propranlol for this  ? GERD (gastroesophageal reflux disease)   ? takes Nexium daily  ? GI bleed 09/2013  ? Headache(784.0)   ? "weekly" (10/18/2013)  ? Hematemesis 10/18/2013  ? hospitalized  ? Hepatitis yrs ago  ? hx of Hep A  ? History of colon polyps   ? History of staph infection 2014  ? Hx of adenomatous colonic polyps 12/18/2017  ? Hypertension   ? takes Atenolol,HCTZ,and Lisinopril daily  ? Insomnia   ? but doesn't take any meds  ? Iron deficiency anemia due to chronic blood loss 11/04/2017  ? Liver cirrhosis secondary to NASH [facility]) + alcohol use? contribution 02/21/2011  ? Neck pain   ? HNP and radiculpathy  ? Neuromuscular disorder ([place])   ? Numbness hands and fingers  ? and tingling  ? Obesity   ? Osteoarthritis   ? "hands; elbows; knees" (10/18/2013)  ? Pancytopenia ([place])   ? but never had a blood transfusion, had to take iron transfusion  sept 2014  ? Sleep apnea   ? sleep study in epic from 2006 but doesn't use a cpap (10/18/2013)  ? Thrombocytopenia ([place]) in cirrhosis and portal hypertension  06/25/2016  ? Type II diabetes mellitus ([place])   ? takes Novolin and Novolog and takes Janumet daily  ? Urinary frequency   ? ?Past Surgical History:  ?Procedure Laterality Date  ? ANTERIOR CERVICAL DECOMP/DISCECTOMY FUSION N/A 05/30/2013  ? Procedure: Cervical five-six anterior cervical decompression with interbody prosthesis plating and bonegraft;  Surgeon: Ophelia Charter, MD;  Location: [place] [facility];  Service: Neurosurgery;  Laterality: N/A;  Cervical five-six anterior cervical decompression with interbody prosthesis plating and bonegraft  ? BIOPSY  06/16/2020  ? Procedure: BIOPSY;  Surgeon: Gatha Mayer, MD;  Location: [facility];  Service: Endoscopy;;  ? COLONOSCOPY WITH PROPOFOL N/A 02/07/2013  ? Procedure: COLONOSCOPY WITH PROPOFOL;  Surgeon:  Arta Silence, MD;  Location: [facility];  Service: Endoscopy;  Laterality: N/A;  ? COLONOSCOPY WITH PROPOFOL N/A 03/06/2014  ? Procedure: COLONOSCOPY WITH PROPOFOL;  Surgeon: Arta Silence, MD;  Location: [facility];  Service: Endoscopy;  Laterality: N/A;  ? ESOPHAGOGASTRODUODENOSCOPY N/A 10/19/2013  ? Procedure: ESOPHAGOGASTRODUODENOSCOPY (EGD);  Surgeon: Jeryl Columbia, MD;  Location: [facility] ENDOSCOPY;  Service: Endoscopy;  Laterality: N/A;  ? ESOPHAGOGASTRODUODENOSCOPY (EGD) WITH PROPOFOL N/A 02/07/2013  ? Procedure: ESOPHAGOGASTRODUODENOSCOPY (EGD) WITH PROPOFOL;  Surgeon: Arta Silence, MD;  Location: [facility];  Service: Endoscopy;  Laterality: N/A;  ? ESOPHAGOGASTRODUODENOSCOPY (EGD) WITH PROPOFOL N/A 03/06/2014  ? Procedure: ESOPHAGOGASTRODUODENOSCOPY (EGD) WITH PROPOFOL;  Surgeon: Arta Silence, MD;  Location: [facility] ENDOSCOPY;  Service: Endoscopy;  Laterality: N/A;  ? ESOPHAGOGASTRODUODENOSCOPY (EGD) WITH PROPOFOL N/A 06/25/2016  ? Procedure: ESOPHAGOGASTRODUODENOSCOPY (EGD) WITH PROPOFOL;  Surgeon: Gatha Mayer, MD;  Location: [facility];  Service: Endoscopy;  Laterality: N/A;  ? ESOPHAGOGASTRODUODENOSCOPY (EGD) WITH PROPOFOL N/A 06/16/2020  ? Procedure:  ESOPHAGOGASTRODUODENOSCOPY (EGD) WITH PROPOFOL;  Surgeon: Gatha Mayer, MD;  Location: [facility] ENDOS[facility];  Service: Endoscopy;  Laterality: N/A;  ? EUS N/A 03/06/2014  ? Procedure: ESOPHAGEAL ENDOSCOPIC ULT

## 2021-08-20 LAB — AFP TUMOR MARKER: AFP-Tumor Marker: 6.2 ng/mL — ABNORMAL HIGH (ref <6.1)

## 2021-09-11 ENCOUNTER — Ambulatory Visit
Admission: RE | Admit: 2021-09-11 | Discharge: 2021-09-11 | Disposition: A | Discharge status: Home / Self Care | Payer: BC Managed Care – PPO | Source: Ambulatory Visit | Attending: Internal Medicine | Admitting: Internal Medicine

## 2021-09-11 DIAGNOSIS — I864 Gastric varices: Secondary | ICD-10-CM | POA: Diagnosis not present

## 2021-09-11 DIAGNOSIS — K769 Liver disease, unspecified: Secondary | ICD-10-CM

## 2021-09-11 DIAGNOSIS — K7460 Unspecified cirrhosis of liver: Secondary | ICD-10-CM | POA: Diagnosis not present

## 2021-09-11 DIAGNOSIS — K766 Portal hypertension: Secondary | ICD-10-CM | POA: Diagnosis not present

## 2021-09-11 DIAGNOSIS — I8500 Esophageal varices without bleeding: Secondary | ICD-10-CM | POA: Diagnosis not present

## 2021-09-11 MED ORDER — GADOBENATE DIMEGLUMINE 529 MG/ML IV SOLN
20.0000 mL | Freq: Once | INTRAVENOUS | Status: AC | PRN
  Administered 2021-09-11: 20 mL via INTRAVENOUS

## 2021-09-13 ENCOUNTER — Encounter: Payer: Self-pay | Admitting: Internal Medicine

## 2021-09-14 ENCOUNTER — Other Ambulatory Visit: Payer: Self-pay

## 2021-09-14 DIAGNOSIS — K769 Liver disease, unspecified: Secondary | ICD-10-CM

## 2021-09-14 NOTE — Addendum Note (Signed)
Addended by: Gillermina Hu on: 09/14/2021 12:31 PM   Modules accepted: Orders

## 2021-09-21 ENCOUNTER — Other Ambulatory Visit: Payer: Self-pay | Admitting: Internal Medicine

## 2021-10-16 ENCOUNTER — Encounter: Payer: Self-pay | Admitting: Internal Medicine

## 2021-10-18 ENCOUNTER — Encounter: Payer: Self-pay | Admitting: Certified Registered Nurse Anesthetist

## 2021-10-22 ENCOUNTER — Telehealth: Payer: Self-pay | Admitting: Internal Medicine

## 2021-10-22 ENCOUNTER — Encounter: Payer: Self-pay | Admitting: Certified Registered Nurse Anesthetist

## 2021-10-22 NOTE — Telephone Encounter (Signed)
Patient's wife called asking if patient was supposed to withhold both or just one of his diabetic medications in the morning prior to his procedure.  Please call patient and advise.

## 2021-10-22 NOTE — Telephone Encounter (Signed)
Called patients wife back and advised her to hold all of patients diabetic medications the morning of his procedure. No further questions.

## 2021-10-23 ENCOUNTER — Ambulatory Visit (AMBULATORY_SURGERY_CENTER): Payer: BC Managed Care – PPO | Admitting: Internal Medicine

## 2021-10-23 ENCOUNTER — Encounter: Payer: Self-pay | Admitting: Internal Medicine

## 2021-10-23 VITALS — BP 111/60 | HR 65 | Temp 98.6°F | Resp 21 | Ht 68.0 in | Wt 294.0 lb

## 2021-10-23 DIAGNOSIS — Z09 Encounter for follow-up examination after completed treatment for conditions other than malignant neoplasm: Secondary | ICD-10-CM

## 2021-10-23 DIAGNOSIS — D123 Benign neoplasm of transverse colon: Secondary | ICD-10-CM

## 2021-10-23 DIAGNOSIS — Z1211 Encounter for screening for malignant neoplasm of colon: Secondary | ICD-10-CM | POA: Diagnosis not present

## 2021-10-23 DIAGNOSIS — K7469 Other cirrhosis of liver: Secondary | ICD-10-CM

## 2021-10-23 DIAGNOSIS — Z86010 Personal history of colonic polyps: Secondary | ICD-10-CM

## 2021-10-23 DIAGNOSIS — K635 Polyp of colon: Secondary | ICD-10-CM

## 2021-10-23 DIAGNOSIS — K639 Disease of intestine, unspecified: Secondary | ICD-10-CM | POA: Diagnosis not present

## 2021-10-23 MED ORDER — SODIUM CHLORIDE 0.9 % IV SOLN: 500.0000 mL | Freq: Once | INTRAVENOUS | Status: DC

## 2021-10-23 NOTE — Op Note (Signed)
[place] Patient Name: Oscar [name] Procedure Date: 10/23/2021 10:06 AM MRN: [id number] Endoscopist: Gatha Mayer , MD Age: 56 Referring MD:  Date of Birth: 1965-06-24 Gender: Male Account #: [id number] Procedure:                Colonoscopy Indications:              Surveillance: Personal history of adenomatous                            polyps on last colonoscopy > 3 years ago, Last                            colonoscopy: 2019 Medicines:                Monitored Anesthesia Care Procedure:                Pre-Anesthesia Assessment:                           - Prior to the procedure, a History and Physical                            was performed, and patient medications and                            allergies were reviewed. The patient's tolerance of                            previous anesthesia was also reviewed. The risks                            and benefits of the procedure and the sedation                            options and risks were discussed with the patient.                            All questions were answered, and informed consent                            was obtained. Prior Anticoagulants: The patient has                            taken no previous anticoagulant or antiplatelet                            agents. ASA Grade Assessment: III - A patient with                            severe systemic disease. After reviewing the risks                            and benefits, the patient was deemed in  satisfactory condition to undergo the procedure.                           After obtaining informed consent, the colonoscope                            was passed under direct vision. Throughout the                            procedure, the patient's blood pressure, pulse, and                            oxygen saturations were monitored continuously. The                            Colonoscope was introduced through the  anus and                            advanced to the the cecum, identified by                            appendiceal orifice and ileocecal valve. The                            colonoscopy was performed without difficulty. The                            patient tolerated the procedure well. The quality                            of the bowel preparation was good. The ileocecal                            valve, appendiceal orifice, and rectum were                            photographed. The bowel preparation used was                            Miralax via split dose instruction. Scope In: 10:15:51 AM Scope Out: 10:31:37 AM Scope Withdrawal Time: 0 hours 13 minutes 49 seconds  Total Procedure Duration: 0 hours 15 minutes 46 seconds  Findings:                 The perianal and digital rectal examinations were                            normal.                           A diminutive polyp was found in the transverse                            colon. The polyp was sessile. The polyp was removed  with a cold snare. Resection and retrieval were                            complete. Verification of patient identification                            for the specimen was done. Estimated blood loss was                            minimal.                           Multiple diverticula were found in the sigmoid                            colon, descending colon and transverse colon.                           Internal hemorrhoids were found.                           Small rectal varices were found.                           A patchy area of white discolration mucosa was                            found in the transverse colon and in the ascending                            colon. Biopsies were taken with a cold forceps for                            histology. Verification of patient identification                            for the specimen was done. Estimated blood loss was                             minimal.                           The exam was otherwise without abnormality on                            direct and retroflexion views. Complications:            No immediate complications. Estimated Blood Loss:     Estimated blood loss was minimal. Impression:               - One diminutive polyp in the transverse colon,                            removed with a cold snare. Resected and retrieved.                           -  Diverticulosis in the sigmoid colon, in the                            descending colon and in the transverse colon.                           - Internal hemorrhoids.                           - Rectal varices.                           - White discolration mucosa in the transverse colon                            and in the ascending colon. Biopsied.                           - The examination was otherwise normal on direct                            and retroflexion views.                           - Personal history of colonic polyps.                           02/2014 multiple adenomas/right hyperplastics                           11/2017 10 mm adenoma + 3 diminutive transverse                            hyperplastic polyps Recommendation:           - Patient has a contact number available for                            emergencies. The signs and symptoms of potential                            delayed complications were discussed with the                            patient. Return to normal activities tomorrow.                            Written discharge instructions were provided to the                            patient.                           - Resume previous diet.                           - Continue present medications.                           -  Await pathology results.                           - Repeat colonoscopy is recommended. The                            colonoscopy date will be determined after pathology                             results from today's exam become available for                            review. Gatha Mayer, MD 10/23/2021 10:41:48 AM This report has been signed electronically.

## 2021-10-23 NOTE — Progress Notes (Signed)
Pt's states no medical or surgical changes since previsit or office visit. VS assessed by D.T 

## 2021-10-23 NOTE — Progress Notes (Signed)
Report given to PACU, vss 

## 2021-10-23 NOTE — Patient Instructions (Addendum)
I found and removed one tiny polyp today. There were some white patches in the colon - probably just the way you are built but I took some biopsies to gain information about them. I am not concerned that they represent a problem.  Also saw diverticulosis, hemorrhoids an the rectal varices as before.  I will let you know pathology results and when to have another routine colonoscopy by mail and/or My Chart.  I appreciate the opportunity to care for you. Gatha Mayer, MD, [facility]   Discharge instructions given. Handouts on polyps,diverticulosis and hemorrhoids. Resume previous medications. YOU HAD AN ENDOSCOPIC PROCEDURE TODAY AT [facility]:   Refer to the procedure report that was given to you for any specific questions about what was found during the examination.  If the procedure report does not answer your questions, please call your gastroenterologist to clarify.  If you requested that your care partner not be given the details of your procedure findings, then the procedure report has been included in a sealed envelope for you to review at your convenience later.  YOU SHOULD EXPECT: Some feelings of bloating in the abdomen. Passage of more gas than usual.  Walking can help get rid of the air that was put into your GI tract during the procedure and reduce the bloating. If you had a lower endoscopy (such as a colonoscopy or flexible sigmoidoscopy) you may notice spotting of blood in your stool or on the toilet paper. If you underwent a bowel prep for your procedure, you may not have a normal bowel movement for a few days.  Please Note:  You might notice some irritation and congestion in your nose or some drainage.  This is from the oxygen used during your procedure.  There is no need for concern and it should clear up in a day or so.  SYMPTOMS TO REPORT IMMEDIATELY:  Following lower endoscopy (colonoscopy or flexible sigmoidoscopy):  Excessive amounts of blood in the  stool  Significant tenderness or worsening of abdominal pains  Swelling of the abdomen that is new, acute  Fever of 100F or higher   For urgent or emergent issues, a gastroenterologist can be reached at any hour by calling [phone]. Do not use MyChart messaging for urgent concerns.    DIET:  We do recommend a small meal at first, but then you may proceed to your regular diet.  Drink plenty of fluids but you should avoid alcoholic beverages for 24 hours.  ACTIVITY:  You should plan to take it easy for the rest of today and you should NOT DRIVE or use heavy machinery until tomorrow (because of the sedation medicines used during the test).    FOLLOW UP: Our staff will call the number listed on your records the next business day following your procedure.  We will call around 7:15- 8:00 am to check on you and address any questions or concerns that you may have regarding the information given to you following your procedure. If we do not reach you, we will leave a message.  If you develop any symptoms (ie: fever, flu-like symptoms, shortness of breath, cough etc.) before then, please call [phone].  If you test positive for Covid 19 in the 2 weeks post procedure, please call and report this information to [place].    If any biopsies were taken you will be contacted by phone or by letter within the next 1-3 weeks.  Please call us at [phone] if you have  not heard about the biopsies in 3 weeks.    SIGNATURES/CONFIDENTIALITY: You and/or your care partner have signed paperwork which will be entered into your electronic medical record.  These signatures attest to the fact that that the information above on your After Visit Summary has been reviewed and is understood.  Full responsibility of the confidentiality of this discharge information lies with you and/or your care-partner.

## 2021-10-23 NOTE — Progress Notes (Signed)
[facility] History and Physical   Primary Care Physician:  Elby Showers, MD   Reason for Procedure:   Hx colon polyps  Plan:    colonoscopy     HPI: Oscar [name] is a 56 y.o. male here for polyp surveillance exam  02/2014 multiple adenomas/right hyperplastics 11/2017 10 mm adenoma + 3 diminutive transverse hyperplastic polyps - post-polypectomy bleed   Wt Readings from Last 3 Encounters:  10/23/21 294 lb (133.4 kg)  08/19/21 294 lb 2 oz (133.4 kg)  02/24/21 293 lb (132.9 kg)    Past Medical History:  Diagnosis Date   Allergy    Anemia    Cirrhosis ([place])    Clotting disorder ([place])    Constipation    related to pain meds and takes Colace daily   Diabetic gastroparesis associated with type 2 diabetes mellitus ([place]) 10/31/2017   Esophageal varices ([place])    Fatty liver    Gastric varices    takes Propranlol for this   GERD (gastroesophageal reflux disease)    takes Nexium daily   GI bleed 09/2013   Headache(784.0)    "weekly" (10/18/2013)   Hematemesis 10/18/2013   hospitalized   Hepatitis yrs ago   hx of Hep A   History of colon polyps    History of staph infection 2014   Hx of adenomatous colonic polyps 12/18/2017   Hypertension    takes Atenolol,HCTZ,and Lisinopril daily   Insomnia    but doesn't take any meds   Iron deficiency anemia due to chronic blood loss 11/04/2017   Liver cirrhosis secondary to NASH ([place]) + alcohol use? contribution 02/21/2011   Neck pain    HNP and radiculpathy   Neuromuscular disorder ([place])    Numbness hands and fingers   and tingling   Obesity    Osteoarthritis    "hands; elbows; knees" (10/18/2013)   Pancytopenia ([place])    but never had a blood transfusion, had to take iron transfusion  sept 2014   Sleep apnea    sleep study in epic from 2006 but doesn't use a cpap (10/18/2013)   Thrombocytopenia ([place]) in cirrhosis and portal hypertension 06/25/2016   Type II diabetes mellitus ([place])    takes Novolin  and Novolog and takes Janumet daily   Urinary frequency     Past Surgical History:  Procedure Laterality Date   ANTERIOR CERVICAL DECOMP/DISCECTOMY FUSION N/A 05/30/2013   Procedure: Cervical five-six anterior cervical decompression with interbody prosthesis plating and bonegraft;  Surgeon: Ophelia Charter, MD;  Location: [facility];  Service: Neurosurgery;  Laterality: N/A;  Cervical five-six anterior cervical decompression with interbody prosthesis plating and bonegraft   BIOPSY  06/16/2020   Procedure: BIOPSY;  Surgeon: Gatha Mayer, MD;  Location: [facility];  Service: Endoscopy;;   COLONOSCOPY WITH PROPOFOL N/A 02/07/2013   Procedure: COLONOSCOPY WITH PROPOFOL;  Surgeon: Arta Silence, MD;  Location: [facility];  Service: Endoscopy;  Laterality: N/A;   COLONOSCOPY WITH PROPOFOL N/A 03/06/2014   Procedure: COLONOSCOPY WITH PROPOFOL;  Surgeon: Arta Silence, MD;  Location: [facility];  Service: Endoscopy;  Laterality: N/A;   ESOPHAGOGASTRODUODENOSCOPY N/A 10/19/2013   Procedure: ESOPHAGOGASTRODUODENOSCOPY (EGD);  Surgeon: Jeryl Columbia, MD;  Location[facility];  Service: Endoscopy;  Laterality: N/A;   ESOPHAGOGASTRODUODENOSCOPY (EGD) WITH PROPOFOL N/A 02/07/2013   Procedure: ESOPHAGOGASTRODUODENOSCOPY (EGD) WITH PROPOFOL;  Surgeon: Arta Silence, MD;  Location: [facility];  Service: Endoscopy;  Laterality: N/A;   ESOPHAGOGASTRODUODENOSCOPY (EGD) WITH PROPOFOL N/A 03/06/2014  Procedure: ESOPHAGOGASTRODUODENOSCOPY (EGD) WITH PROPOFOL;  Surgeon: Arta Silence, MD;  [facility]: [facility];  Service: Endoscopy;  Laterality: N/A;   ESOPHAGOGASTRODUODENOSCOPY (EGD) WITH PROPOFOL N/A 06/25/2016   Procedure: ESOPHAGOGASTRODUODENOSCOPY (EGD) WITH PROPOFOL;  Surgeon: Gatha Mayer, MD;  [facility]: [facility];  Service: Endoscopy;  Laterality: N/A;   ESOPHAGOGASTRODUODENOSCOPY (EGD) WITH PROPOFOL N/A 06/16/2020   Procedure: ESOPHAGOGASTRODUODENOSCOPY (EGD) WITH PROPOFOL;  Surgeon:  Gatha Mayer, MD;  Location: [facility];  Service: Endoscopy;  Laterality: N/A;   EUS N/A 03/06/2014   Procedure: ESOPHAGEAL ENDOSCOPIC ULTRASOUND (EUS) RADIAL;  Surgeon: Arta Silence, MD;  Location: [facility];  Service: Endoscopy;  Laterality: N/A;   FLEXIBLE SIGMOIDOSCOPY N/A 12/14/2017   Procedure: FLEXIBLE SIGMOIDOSCOPY ;  Surgeon: Rush Landmark Telford Nab., MD;  Location: [place];  Service: Gastroenterology;  Laterality: N/A;   HOT HEMOSTASIS N/A 12/14/2017   Procedure: HEMOSTASIS CLIPS;  Surgeon: Rush Landmark Telford Nab., MD;  Location: [place];  Service: Gastroenterology;  Laterality: N/A;   INCISION AND DRAINAGE MOUTH  02/2013   " jaw area"   [place]  ~  1997    Prior to Admission medications   Medication Sig Start Date End Date Taking? Authorizing Provider  acetaminophen (TYLENOL) 500 MG tablet Take 500-1,000 mg by mouth every 6 (six) hours as needed for moderate pain or headache.   Yes [provider]  dapagliflozin propanediol (FARXIGA) 10 MG TABS tablet Take 10 mg by mouth daily.   Yes [provider]  esomeprazole (NEXIUM) 20 MG capsule Take 2 capsules (40 mg total) by mouth daily before breakfast. 06/16/20  Yes Gatha Mayer, MD  gabapentin (NEURONTIN) 300 MG capsule TAKE 2 CAPSULE BY MOUTH THREE TIMES DAILY 09/21/21  Yes Baxley, Cresenciano Lick, MD  hydrochlorothiazide (HYDRODIURIL) 25 MG tablet TAKE 1 TABLET BY MOUTH IN  THE MORNING 04/10/21  Yes Baxley, Cresenciano Lick, MD  lisinopril (ZESTRIL) 20 MG tablet TAKE 1 TABLET BY MOUTH  DAILY 03/25/21  Yes Elby Showers, MD  metFORMIN (GLUCOPHAGE-XR) 500 MG 24 hr tablet Take 1,000 mg by mouth 2 (two) times daily. 02/07/21  Yes [provider]  MOUNJARO 2.5 MG/0.5ML Pen SMARTSIG:2.5 Milligram(s) SUB-Q Once a Week 02/13/21  Yes [provider]  nadolol (CORGARD) 20 MG tablet Take 1 tablet (20 mg total) by mouth daily. 07/16/21  Yes Baxley, Cresenciano Lick, MD  NOVOLOG MIX 70/30 FLEXPEN (70-30)  100 UNIT/ML FlexPen Inject 15-50 Units into the skin See admin instructions. Take 40 mg in the morning 15 mg at lunch and 50 units at dinner may adjust sliding scale 10/20/16  Yes [provider]  Continuous Blood Gluc Sensor (FREESTYLE LIBRE 2 SENSOR) MISC Inject into the skin every 14 (fourteen) days. 02/07/21   [provider]  sodium chloride (OCEAN) 0.65 % SOLN nasal spray Place 2 sprays into both nostrils daily as needed for congestion.    [provider]    Current Outpatient Medications  Medication Sig Dispense Refill   acetaminophen (TYLENOL) 500 MG tablet Take 500-1,000 mg by mouth every 6 (six) hours as needed for moderate pain or headache.     dapagliflozin propanediol (FARXIGA) 10 MG TABS tablet Take 10 mg by mouth daily.     esomeprazole (NEXIUM) 20 MG capsule Take 2 capsules (40 mg total) by mouth daily before breakfast.     gabapentin (NEURONTIN) 300 MG capsule TAKE 2 CAPSULE BY MOUTH THREE TIMES DAILY 180 capsule 5   hydrochlorothiazide (HYDRODIURIL) 25 MG tablet TAKE 1 TABLET BY MOUTH  IN  THE MORNING 90 tablet 3   lisinopril (ZESTRIL) 20 MG tablet TAKE 1 TABLET BY MOUTH  DAILY 90 tablet 3   metFORMIN (GLUCOPHAGE-XR) 500 MG 24 hr tablet Take 1,000 mg by mouth 2 (two) times daily.     MOUNJARO 2.5 MG/0.5ML Pen SMARTSIG:2.5 Milligram(s) SUB-Q Once a Week     nadolol (CORGARD) 20 MG tablet Take 1 tablet (20 mg total) by mouth daily. 90 tablet 2   NOVOLOG MIX 70/30 FLEXPEN (70-30) 100 UNIT/ML FlexPen Inject 15-50 Units into the skin See admin instructions. Take 40 mg in the morning 15 mg at lunch and 50 units at dinner may adjust sliding scale     Continuous Blood Gluc Sensor (FREESTYLE LIBRE 2 SENSOR) MISC Inject into the skin every 14 (fourteen) days.     sodium chloride (OCEAN) 0.65 % SOLN nasal spray Place 2 sprays into both nostrils daily as needed for congestion.     Current Facility-Administered Medications  Medication Dose Route Frequency Provider  Last Rate Last Admin   0.9 %  sodium chloride infusion  500 mL Intravenous Once Gatha Mayer, MD        Allergies as of 10/23/2021 - Review Complete 10/23/2021  Allergen Reaction Noted   [place] 10 mcg pen [exenatide] Nausea And Vomiting 03/15/2013    Family History  Problem Relation Age of Onset   Hypertension Mother    Heart failure Mother    Pancreatitis Father    Kidney cancer Father    Brain cancer Sister    Lung cancer Sister    Kidney disease Brother    Diabetes Brother    Colon cancer Neg Hx    Stomach cancer Neg Hx    Liver disease Neg Hx    Esophageal cancer Neg Hx    Rectal cancer Neg Hx     Social History   Socioeconomic History   Marital status: Married    Spouse name: Not on file   Number of children: 0   Years of education: Not on file   Highest education level: Not on file  Occupational History   Occupation: Freight forwarder    Employer: [employer]  Tobacco Use   Smoking status: Never   Smokeless tobacco: Never   Tobacco comments:    never used tobacco  Vaping Use   Vaping Use: Never used  Substance and Sexual Activity   Alcohol use: No    Alcohol/week: 0.0 standard drinks of alcohol    Comment: quit drinking compeletely in Oct 2014   Drug use: No   Sexual activity: Not Currently  Other Topics Concern   Not on file  Social History Narrative   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  Physical Exam: Vital signs BP (!) 134/113   Pulse 65   Temp 98.6 F (37 C) (Skin)   Ht  5' 8"  (1.727 m)   Wt 294 lb (133.4 kg)   SpO2 98%  BMI 44.70 kg/m   General:   Alert,  Well-developed, well-nourished, pleasant and cooperative in NAD Lungs:  Clear throughout to auscultation.   Heart:  Regular rate and rhythm; no murmurs, clicks, rubs,  or gallops. Abdomen:  Soft, obese nontender and nondistended. Normal bowel sounds.   Neuro/Psych:  Alert and cooperative. Normal mood and affect. A and O x 3   @Rondo Spittler  Simonne Maffucci, MD, [facility] [phone] (pager) 10/23/2021 10:06 AM@

## 2021-10-23 NOTE — Progress Notes (Signed)
Called to room to assist during endoscopic procedure.  Patient ID and intended procedure confirmed with present staff. Received instructions for my participation in the procedure from the performing physician.  

## 2021-10-26 ENCOUNTER — Telehealth: Payer: Self-pay

## 2021-10-26 NOTE — Telephone Encounter (Signed)
Left message on answering machine. 

## 2021-11-01 ENCOUNTER — Encounter: Payer: Self-pay | Admitting: Internal Medicine

## 2021-11-02 DIAGNOSIS — E1165 Type 2 diabetes mellitus with hyperglycemia: Secondary | ICD-10-CM | POA: Diagnosis not present

## 2021-11-02 DIAGNOSIS — K769 Liver disease, unspecified: Secondary | ICD-10-CM | POA: Diagnosis not present

## 2021-11-02 DIAGNOSIS — I10 Essential (primary) hypertension: Secondary | ICD-10-CM | POA: Diagnosis not present

## 2021-11-02 DIAGNOSIS — E1140 Type 2 diabetes mellitus with diabetic neuropathy, unspecified: Secondary | ICD-10-CM | POA: Diagnosis not present

## 2021-11-02 DIAGNOSIS — Z23 Encounter for immunization: Secondary | ICD-10-CM | POA: Diagnosis not present

## 2022-01-01 ENCOUNTER — Ambulatory Visit
Admission: RE | Admit: 2022-01-01 | Discharge: 2022-01-01 | Disposition: A | Discharge status: Home / Self Care | Payer: BC Managed Care – PPO | Source: Ambulatory Visit | Attending: Internal Medicine | Admitting: Internal Medicine

## 2022-01-01 DIAGNOSIS — K7689 Other specified diseases of liver: Secondary | ICD-10-CM | POA: Diagnosis not present

## 2022-01-01 DIAGNOSIS — K769 Liver disease, unspecified: Secondary | ICD-10-CM

## 2022-01-01 MED ORDER — GADOPICLENOL 0.5 MMOL/ML IV SOLN
10.0000 mL | Freq: Once | INTRAVENOUS | Status: AC | PRN
  Administered 2022-01-01: 10 mL via INTRAVENOUS

## 2022-01-18 ENCOUNTER — Other Ambulatory Visit (HOSPITAL_COMMUNITY): Payer: BC Managed Care – PPO

## 2022-02-16 ENCOUNTER — Other Ambulatory Visit: Payer: Self-pay | Admitting: Internal Medicine

## 2022-02-25 ENCOUNTER — Other Ambulatory Visit: Payer: BC Managed Care – PPO

## 2022-02-25 DIAGNOSIS — Z125 Encounter for screening for malignant neoplasm of prostate: Secondary | ICD-10-CM

## 2022-02-25 DIAGNOSIS — Z8639 Personal history of other endocrine, nutritional and metabolic disease: Secondary | ICD-10-CM

## 2022-02-25 DIAGNOSIS — I10 Essential (primary) hypertension: Secondary | ICD-10-CM

## 2022-02-25 DIAGNOSIS — E8881 Metabolic syndrome: Secondary | ICD-10-CM

## 2022-02-25 DIAGNOSIS — E7849 Other hyperlipidemia: Secondary | ICD-10-CM

## 2022-02-25 DIAGNOSIS — E119 Type 2 diabetes mellitus without complications: Secondary | ICD-10-CM

## 2022-03-01 ENCOUNTER — Encounter: Payer: BC Managed Care – PPO | Admitting: Internal Medicine

## 2022-03-10 ENCOUNTER — Other Ambulatory Visit: Payer: Self-pay | Admitting: Internal Medicine

## 2022-03-22 ENCOUNTER — Other Ambulatory Visit: Payer: Self-pay

## 2022-03-22 MED ORDER — GABAPENTIN 300 MG PO CAPS: ORAL_CAPSULE | ORAL | 5 refills | Status: DC

## 2022-04-12 ENCOUNTER — Other Ambulatory Visit: Payer: Self-pay

## 2022-04-12 MED ORDER — NADOLOL 20 MG PO TABS: 20.0000 mg | ORAL_TABLET | Freq: Every day | ORAL | 2 refills | Status: DC

## 2022-05-05 DIAGNOSIS — E7800 Pure hypercholesterolemia, unspecified: Secondary | ICD-10-CM | POA: Diagnosis not present

## 2022-05-05 DIAGNOSIS — E1165 Type 2 diabetes mellitus with hyperglycemia: Secondary | ICD-10-CM | POA: Diagnosis not present

## 2022-05-05 DIAGNOSIS — E669 Obesity, unspecified: Secondary | ICD-10-CM | POA: Diagnosis not present

## 2022-05-12 ENCOUNTER — Encounter: Payer: Self-pay | Admitting: Hematology & Oncology

## 2022-05-12 ENCOUNTER — Other Ambulatory Visit (HOSPITAL_COMMUNITY): Payer: Self-pay

## 2022-05-12 DIAGNOSIS — K769 Liver disease, unspecified: Secondary | ICD-10-CM | POA: Diagnosis not present

## 2022-05-12 DIAGNOSIS — I10 Essential (primary) hypertension: Secondary | ICD-10-CM | POA: Diagnosis not present

## 2022-05-12 DIAGNOSIS — E1140 Type 2 diabetes mellitus with diabetic neuropathy, unspecified: Secondary | ICD-10-CM | POA: Diagnosis not present

## 2022-05-12 DIAGNOSIS — E1165 Type 2 diabetes mellitus with hyperglycemia: Secondary | ICD-10-CM | POA: Diagnosis not present

## 2022-05-12 MED ORDER — MOUNJARO 7.5 MG/0.5ML ~~LOC~~ SOAJ
7.5000 mg | SUBCUTANEOUS | 5 refills | Status: AC
  Filled 2022-05-12 – 2022-08-16 (×2): qty 2, 28d supply, fill #0
  Filled 2022-09-14: qty 2, 28d supply, fill #1
  Filled 2022-10-09: qty 2, 28d supply, fill #2
  Filled 2022-12-09: qty 2, 28d supply, fill #3

## 2022-05-12 MED ORDER — MOUNJARO 7.5 MG/0.5ML ~~LOC~~ SOAJ
7.5000 mg | SUBCUTANEOUS | 5 refills | Status: DC
  Filled 2022-05-12: qty 2, 28d supply, fill #0
  Filled 2022-06-10: qty 2, 28d supply, fill #1
  Filled 2022-07-16: qty 2, 28d supply, fill #2
  Filled 2022-11-07: qty 2, 28d supply, fill #3

## 2022-05-14 ENCOUNTER — Other Ambulatory Visit (HOSPITAL_COMMUNITY): Payer: Self-pay

## 2022-05-18 ENCOUNTER — Other Ambulatory Visit: Payer: Self-pay | Admitting: Internal Medicine

## 2022-06-14 NOTE — Progress Notes (Signed)
Patient Care Team: Elby Showers, MD as PCP - General (Internal Medicine)  Visit Date: 06/21/22  Subjective:    Patient ID: Oscar [name] , Male   DOB: 12-20-1965, 57 y.o.    MRN: [id number]   57 y.o. Male presents today for an annual comprehensive physical exam. Patient has a past medical history of anemia, cirrhosis, clotting disorder, diabetic gastroparesis, Type 2 diabetes mellitus, GERD, GI bleed, headaches, hematemesis, staph infection, adenomatous polyps, hypertension, insomnia, iron deficiency, obesity, osteoarthritis, neuromuscular disorder, thrombocytopenia in cirrhosis and portal hypertension, adenomatous colon polyp. Had colonoscopy 2023 and he needs to check with GI about repeat study.  Reports feeling generally well.  Dr. Chalmers Cater is his Endocrinologist.  Hemoglobin A1c stable at 6.8% and a year ago was 6.6%.  Fasting glucose associated with this visit was 135.  In 2015, he had a C5/C6 anterior discectomy by Dr. Arnoldo Morale.  Developed left cervical pain in 2017. Received injections at C2, C3, C4, C5.  At [facility].  2017 MRI showed good appearance of the fusion level of C5-6, grossly non-compressive minimal disc bulges at C3-4 and C4-5.  Now with recurrent symptoms and wants to see Dr. Arnoldo Morale again.Apparently injections per Dr. Brien Few in 2017 which helped relieve pain.  History of Type 2 diabetes treated with Mounjaro 7.5 mg once weekly, Farxiga 10 mg daily, Glucophage-XR 1,000 mg twice daily. HGBA1c at 6.8 on 06/18/22.  History of nerve pain treated with Neurontin 600 mg three times daily.  History of hypertension treated with hydrochlorothiazide 25 mg daily in the morning, Corgard 20 mg daily, Zestril 20 mg daily. Blood pressure at 102/64 today.  History of GERD treated with Nexium 40 mg daily before breakfast.  History of low WBC, platelet count. Kidney, liver functions normal. Total blood protein normal. Lipid panel normal.  PSA normal at 0.37 on  02/19/21.  Labs drawn on March 23 showed PSA to be normal at 0.30  Longstanding Hx of obesity. BMI 43.66  Colonoscopy last completed 10/23/21. Results showed one diminutive polyp in transverse colon, removed, diverticulosis in sigmoid, descending, transverse colon, internal hemorrhoids, rectal varices, benign lymphoid tissue in transverse and ascending colon. Otherwise normal. Recommended repeat 2028.  He had endoscopy for very 2022 showing grade 2 small (less than 5 mm ) varices found in the lower third of the esophagus.  They were 4 mm and the largest diameter.  He had diffuse portal hypertensive gastropathy in the entire stomach which was mild to moderate.  He had multiple eroded papules/nodules in the stomach which were biopsied.  Pathology showed these to be reactive gastropathy with hyperplastic changes, acute and chronic inflammation.  No intestinal metaplasia or H. pylori identified.  Longstanding history of benign neutropenia and also history of thrombocytopenia.   Past Medical History:  Diagnosis Date   Allergy    Anemia    Cirrhosis ([facility])    Clotting disorder ([facility])    Constipation    related to pain meds and takes Colace daily   Diabetic gastroparesis associated with type 2 diabetes mellitus ([place]) 10/31/2017   Esophageal varices ([facility])    Fatty liver    Gastric varices    takes Propranlol for this   GERD (gastroesophageal reflux disease)    takes Nexium daily   GI bleed 09/2013   Headache(784.0)    "weekly" (10/18/2013)   Hematemesis 10/18/2013   hospitalized   Hepatitis yrs ago   hx of Hep A   History of colon polyps  History of staph infection 2014   Hx of adenomatous colonic polyps 12/18/2017   Hypertension    takes Atenolol,HCTZ,and Lisinopril daily   Insomnia    but doesn't take any meds   Iron deficiency anemia due to chronic blood loss 11/04/2017   Liver cirrhosis secondary to NASH [facility]) + alcohol use? contribution 02/21/2011   Neck pain    HNP and  radiculpathy   Neuromuscular disorder ([place])    Numbness hands and fingers   and tingling   Obesity    Osteoarthritis    "hands; elbows; knees" (10/18/2013)   Pancytopenia ([place])    but never had a blood transfusion, had to take iron transfusion  sept 2014   Sleep apnea    sleep study in epic from 2006 but doesn't use a cpap (10/18/2013)   Thrombocytopenia ([facility]) in cirrhosis and portal hypertension 06/25/2016   Type II diabetes mellitus ([place])    takes Novolin and Novolog and takes Janumet daily   Urinary frequency      Family History  Problem Relation Age of Onset   Hypertension Mother    Heart failure Mother    Pancreatitis Father    Kidney cancer Father    Brain cancer Sister    Lung cancer Sister    Kidney disease Brother    Diabetes Brother    Colon cancer Neg Hx    Stomach cancer Neg Hx    Liver disease Neg Hx    Esophageal cancer Neg Hx    Rectal cancer Neg Hx     Social History   Social History Narrative   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  HENT:  Negative for hearing loss, sinus pain and sore throat.   Respiratory:  Negative for cough and hemoptysis.   Cardiovascular:  Negative for chest pain, palpitations, leg swelling and PND.  Gastrointestinal:  Negative for abdominal pain, constipation, diarrhea, heartburn, nausea and vomiting.  Genitourinary:  Negative for dysuria, frequency and urgency.  Musculoskeletal:  Positive for neck pain. Negative for back pain and myalgias.  Skin:  Negative for itching and rash.  Neurological:  Negative for dizziness, tingling, seizures and headaches.  Endo/Heme/Allergies:  Negative for polydipsia.  Psychiatric/Behavioral:  Negative for depression. The patient is not nervous/anxious.         Objective:   Vitals: BP 102/64    Pulse 69   Temp 98.2 F (36.8 C) (Tympanic)   Ht '5\' 8"'$  (1.727 m)   Wt 287 lb 1.9 oz (130.2 kg)   BMI 43.66 kg/m    Physical Exam Vitals and nursing note reviewed.  Constitutional:      General: He is awake. He is not in acute distress.    Appearance: Normal appearance. He is not ill-appearing or toxic-appearing.  HENT:     Head: Normocephalic and atraumatic.     Right Ear: Tympanic membrane, ear canal and external ear normal.     Left Ear: Tympanic membrane, ear canal and external ear normal.     Mouth/Throat:     Pharynx: Oropharynx is clear.  Eyes:     Extraocular Movements: Extraocular movements intact.     Pupils: Pupils are equal, round, and reactive to light.  Neck:     Thyroid: No  thyroid mass, thyromegaly or thyroid tenderness.     Vascular: No carotid bruit.  Cardiovascular:     Rate and Rhythm: Normal rate and regular rhythm. No extrasystoles are present.    Pulses: Normal pulses.          Dorsalis pedis pulses are 2+ on the right side and 2+ on the left side.       Posterior tibial pulses are 2+ on the right side and 2+ on the left side.     Heart sounds: Normal heart sounds. No murmur heard.    No friction rub. No gallop.  Pulmonary:     Effort: Pulmonary effort is normal.     Breath sounds: Normal breath sounds. No decreased breath sounds, wheezing, rhonchi or rales.  Chest:     Chest wall: No mass.  Abdominal:     Palpations: Abdomen is soft.     Tenderness: There is no abdominal tenderness.     Hernia: No hernia is present.  Genitourinary:    Prostate: Normal. Not enlarged and no nodules present.     Comments: Prostate smooth and symmetrical upon examination. Musculoskeletal:     Cervical back: Normal range of motion.     Right lower leg: 1+ Pitting Edema present.     Left lower leg: 1+ Pitting Edema present.  Lymphadenopathy:     Cervical: No cervical adenopathy.     Upper Body:     Right upper body: No supraclavicular adenopathy.     Left upper  body: No supraclavicular adenopathy.  Skin:    General: Skin is warm and dry.  Neurological:     General: No focal deficit present.     Mental Status: He is alert and oriented to person, place, and time. Mental status is at baseline.     Cranial Nerves: Cranial nerves 2-12 are intact.     Sensory: Sensation is intact.     Motor: Motor function is intact.     Coordination: Coordination is intact.     Gait: Gait is intact.     Deep Tendon Reflexes:     Reflex Scores:      Tricep reflexes are 1+ on the right side and 1+ on the left side.      Bicep reflexes are 2+ on the right side and 2+ on the left side.      Brachioradialis reflexes are 0 on the right side and 0 on the left side.    Comments: Cannot illicit radialis DTR bilaterally.  Psychiatric:        Attention and Perception: Attention normal.        Mood and Affect: Mood normal.        Speech: Speech normal.        Behavior: Behavior normal. Behavior is cooperative.        Thought Content: Thought content normal.        Cognition and Memory: Cognition and memory normal.        Judgment: Judgment normal.       Results:   Studies obtained and personally reviewed by me:  Colonoscopy last completed 10/23/21. Results showed one diminutive polyp in transverse colon, removed, diverticulosis in sigmoid, descending, transverse colon, internal hemorrhoids, rectal varices, benign lymphoid tissue in transverse and ascending colon. Otherwise normal. Recommended repeat 2028.   Labs:       Component Value Date/Time   NA 141 06/18/2022 0913   NA 136 10/07/2016 1517   K 4.2 06/18/2022 0913  K 4.1 10/07/2016 1517   CL 106 06/18/2022 0913   CL 102 10/07/2016 1517   CO2 26 06/18/2022 0913   CO2 25 10/07/2016 1517   GLUCOSE 135 (H) 06/18/2022 0913   GLUCOSE 366 (H) 10/07/2016 1517   BUN 16 06/18/2022 0913   BUN 11 10/07/2016 1517   CREATININE 0.94 06/18/2022 0913   CALCIUM 9.1 06/18/2022 0913   CALCIUM 9.6 10/07/2016 1517   PROT  6.6 06/18/2022 0913   PROT 6.8 10/07/2016 1517   ALBUMIN 3.9 08/19/2021 1025   ALBUMIN 3.5 10/07/2016 1517   ALBUMIN 3.6 04/08/2016 1452   AST 24 06/18/2022 0913   AST 19 11/01/2017 1338   ALT 20 06/18/2022 0913   ALT 21 11/01/2017 1338   ALT 35 10/07/2016 1517   ALKPHOS 88 08/19/2021 1025   ALKPHOS 129 (H) 10/07/2016 1517   BILITOT 1.1 06/18/2022 0913   BILITOT 1.0 11/01/2017 1338   GFRNONAA 91 02/15/2020 0914   GFRAA 105 02/15/2020 0914     Lab Results  Component Value Date   WBC 3.1 (L) 06/18/2022   HGB 15.0 06/18/2022   HCT 42.2 06/18/2022   MCV 96.3 06/18/2022   PLT 108 (L) 06/18/2022    Lab Results  Component Value Date   CHOL 129 06/18/2022   HDL 58 06/18/2022   LDLCALC 57 06/18/2022   TRIG 67 06/18/2022   CHOLHDL 2.2 06/18/2022    Lab Results  Component Value Date   HGBA1C 6.8 (H) 06/18/2022     Lab Results  Component Value Date   TSH 1.22 11/22/2016     Lab Results  Component Value Date   PSA 0.30 06/18/2022   PSA 0.37 02/19/2021   PSA 0.26 02/15/2020      Assessment & Plan:   Type 2 diabetes: treated with Mounjaro 7.5 mg once weekly, Farxiga 10 mg daily, Glucophage-XR 1,000 mg twice daily. HGBA1c at 6.8 on 06/18/22.  Neuropathy: treated with Neurontin 600 mg three times daily.  Hypertension: treated with hydrochlorothiazide 25 mg daily in the morning, Corgard 20 mg daily, Zestril 20 mg daily. Blood pressure at 102/64 today.  BMI 43.66-continue diet and exercise efforts  GERD: treated with Nexium 40 mg daily before breakfast.  No complaints of abdominal pain  Colonoscopy: last completed 10/23/21. Results showed one diminutive polyp in transverse colon, removed, diverticulosis in sigmoid, descending, transverse colon, internal hemorrhoids, rectal varices, benign lymphoid tissue in transverse and ascending colon. Otherwise normal. Recommended repeat 2028.  Vaccine Counseling: Administered tetanus, pneumococcal 20 vaccines today. UTD on   vaccine.  Thrombocytopenia likely related to cirrhosis  Benign neutropenia  Alcoholic liver disease and likely needs follow-up for liver disease soon with [facility].  He should contact them for an appointment.   History of esophageal varices  Neck pain- pt wants to be referred back to Dr. Arnoldo Morale at [facility] for evaluation of neck pain and left upper extremity radiculopathy.  History of C5-C6 anterior discectomy by Dr. Arnoldo Morale in 2015  History of sleep apnea treated by Dr. Annamaria Boots in 2006 with CPAP but currently not using it.  Reminded about diabetic eye exam annually  Healthcare maintenance-given tetanus vaccine and pneumococcal 20 vaccine today.  Return in 1 year or as needed.  I,Alexander Ruley,acting as a Education administrator for Elby Showers, MD.,have documented all relevant documentation on the behalf of Elby Showers, MD,as directed by  Elby Showers, MD while in the presence of Elby Showers, MD.   I, Elby Showers,  MD, have reviewed all documentation for this visit. The documentation on 06/21/22 for the exam, diagnosis, procedures, and orders are all accurate and complete.

## 2022-06-15 ENCOUNTER — Other Ambulatory Visit (HOSPITAL_COMMUNITY): Payer: Self-pay

## 2022-06-15 ENCOUNTER — Encounter (HOSPITAL_COMMUNITY): Payer: Self-pay

## 2022-06-17 ENCOUNTER — Other Ambulatory Visit: Payer: BC Managed Care – PPO

## 2022-06-17 DIAGNOSIS — I10 Essential (primary) hypertension: Secondary | ICD-10-CM

## 2022-06-17 DIAGNOSIS — E7849 Other hyperlipidemia: Secondary | ICD-10-CM

## 2022-06-17 DIAGNOSIS — Z125 Encounter for screening for malignant neoplasm of prostate: Secondary | ICD-10-CM

## 2022-06-17 DIAGNOSIS — E119 Type 2 diabetes mellitus without complications: Secondary | ICD-10-CM

## 2022-06-18 ENCOUNTER — Other Ambulatory Visit: Payer: BC Managed Care – PPO

## 2022-06-18 DIAGNOSIS — E119 Type 2 diabetes mellitus without complications: Secondary | ICD-10-CM

## 2022-06-18 DIAGNOSIS — I10 Essential (primary) hypertension: Secondary | ICD-10-CM

## 2022-06-18 DIAGNOSIS — E7849 Other hyperlipidemia: Secondary | ICD-10-CM | POA: Diagnosis not present

## 2022-06-18 DIAGNOSIS — Z125 Encounter for screening for malignant neoplasm of prostate: Secondary | ICD-10-CM

## 2022-06-18 NOTE — Addendum Note (Signed)
Addended by: Geradine Girt D on: 06/18/2022 08:40 AM   Modules accepted: Orders

## 2022-06-19 LAB — HEMOGLOBIN A1C
Hgb A1c MFr Bld: 6.8 % of total Hgb — ABNORMAL HIGH (ref <5.7)
Mean Plasma Glucose: 148 mg/dL
eAG (mmol/L): 8.2 mmol/L

## 2022-06-19 LAB — CBC WITH DIFFERENTIAL/PLATELET
Absolute Monocytes: 226 cells/uL (ref 200–950)
Basophils Absolute: 9 cells/uL (ref 0–200)
Basophils Relative: 0.3 %
Eosinophils Absolute: 99 cells/uL (ref 15–500)
Eosinophils Relative: 3.2 %
HCT: 42.2 % (ref 38.5–50.0)
Hemoglobin: 15 g/dL (ref 13.2–17.1)
Lymphs Abs: 1125 cells/uL (ref 850–3900)
MCH: 34.2 pg — ABNORMAL HIGH (ref 27.0–33.0)
MCHC: 35.5 g/dL (ref 32.0–36.0)
MCV: 96.3 fL (ref 80.0–100.0)
MPV: 11 fL (ref 7.5–12.5)
Monocytes Relative: 7.3 %
Neutro Abs: 1640 cells/uL (ref 1500–7800)
Neutrophils Relative %: 52.9 %
Platelets: 108 10*3/uL — ABNORMAL LOW (ref 140–400)
RBC: 4.38 10*6/uL (ref 4.20–5.80)
RDW: 14.1 % (ref 11.0–15.0)
Total Lymphocyte: 36.3 %
WBC: 3.1 10*3/uL — ABNORMAL LOW (ref 3.8–10.8)

## 2022-06-19 LAB — COMPLETE METABOLIC PANEL WITH GFR
AG Ratio: 1.3 (calc) (ref 1.0–2.5)
ALT: 20 U/L (ref 9–46)
AST: 24 U/L (ref 10–35)
Albumin: 3.7 g/dL (ref 3.6–5.1)
Alkaline phosphatase (APISO): 122 U/L (ref 35–144)
BUN: 16 mg/dL (ref 7–25)
CO2: 26 mmol/L (ref 20–32)
Calcium: 9.1 mg/dL (ref 8.6–10.3)
Chloride: 106 mmol/L (ref 98–110)
Creat: 0.94 mg/dL (ref 0.70–1.30)
Globulin: 2.9 g/dL (calc) (ref 1.9–3.7)
Glucose, Bld: 135 mg/dL — ABNORMAL HIGH (ref 65–99)
Potassium: 4.2 mmol/L (ref 3.5–5.3)
Sodium: 141 mmol/L (ref 135–146)
Total Bilirubin: 1.1 mg/dL (ref 0.2–1.2)
Total Protein: 6.6 g/dL (ref 6.1–8.1)
eGFR: 96 mL/min/{1.73_m2} (ref >=60)

## 2022-06-19 LAB — LIPID PANEL
Cholesterol: 129 mg/dL (ref <200)
HDL: 58 mg/dL (ref >=40)
LDL Cholesterol (Calc): 57 mg/dL (calc)
Non-HDL Cholesterol (Calc): 71 mg/dL (calc) (ref <130)
Total CHOL/HDL Ratio: 2.2 (calc) (ref <5.0)
Triglycerides: 67 mg/dL (ref <150)

## 2022-06-19 LAB — MICROALBUMIN / CREATININE URINE RATIO
Creatinine, Urine: 67 mg/dL (ref 20–320)
Microalb Creat Ratio: 12 mcg/mg creat (ref <30)
Microalb, Ur: 0.8 mg/dL

## 2022-06-19 LAB — PSA: PSA: 0.3 ng/mL (ref <=4.00)

## 2022-06-21 ENCOUNTER — Ambulatory Visit (INDEPENDENT_AMBULATORY_CARE_PROVIDER_SITE_OTHER): Payer: BC Managed Care – PPO | Admitting: Internal Medicine

## 2022-06-21 ENCOUNTER — Encounter: Payer: Self-pay | Admitting: Internal Medicine

## 2022-06-21 VITALS — BP 102/64 | HR 69 | Temp 98.2°F | Ht 68.0 in | Wt 287.1 lb

## 2022-06-21 DIAGNOSIS — I10 Essential (primary) hypertension: Secondary | ICD-10-CM | POA: Diagnosis not present

## 2022-06-21 DIAGNOSIS — Z6841 Body Mass Index (BMI) 40.0 and over, adult: Secondary | ICD-10-CM

## 2022-06-21 DIAGNOSIS — Z8719 Personal history of other diseases of the digestive system: Secondary | ICD-10-CM

## 2022-06-21 DIAGNOSIS — Z86010 Personal history of colonic polyps: Secondary | ICD-10-CM | POA: Diagnosis not present

## 2022-06-21 DIAGNOSIS — Z0000 Encounter for general adult medical examination without abnormal findings: Secondary | ICD-10-CM

## 2022-06-21 DIAGNOSIS — Z23 Encounter for immunization: Secondary | ICD-10-CM

## 2022-06-21 DIAGNOSIS — Z8639 Personal history of other endocrine, nutritional and metabolic disease: Secondary | ICD-10-CM

## 2022-06-21 DIAGNOSIS — K7030 Alcoholic cirrhosis of liver without ascites: Secondary | ICD-10-CM

## 2022-06-21 DIAGNOSIS — K219 Gastro-esophageal reflux disease without esophagitis: Secondary | ICD-10-CM | POA: Diagnosis not present

## 2022-06-21 DIAGNOSIS — E119 Type 2 diabetes mellitus without complications: Secondary | ICD-10-CM

## 2022-06-21 DIAGNOSIS — Z794 Long term (current) use of insulin: Secondary | ICD-10-CM

## 2022-06-21 DIAGNOSIS — D708 Other neutropenia: Secondary | ICD-10-CM

## 2022-06-21 DIAGNOSIS — D696 Thrombocytopenia, unspecified: Secondary | ICD-10-CM

## 2022-06-21 DIAGNOSIS — I8510 Secondary esophageal varices without bleeding: Secondary | ICD-10-CM

## 2022-06-21 LAB — POCT URINALYSIS DIPSTICK
Bilirubin, UA: NEGATIVE
Blood, UA: NEGATIVE
Glucose, UA: POSITIVE — AB
Ketones, UA: NEGATIVE
Leukocytes, UA: NEGATIVE
Nitrite, UA: NEGATIVE
Protein, UA: NEGATIVE
Spec Grav, UA: 1.01 (ref 1.010–1.025)
Urobilinogen, UA: 0.2 E.U./dL
pH, UA: 5 (ref 5.0–8.0)

## 2022-06-21 NOTE — Patient Instructions (Addendum)
Please call [facility] to see when your follow-up appointment is due.  We will make referral to Dr. Arnoldo Morale at [facility] but you should call and follow-up with them to make sure you get an appointment for neck pain.  Continue current medications and follow-up in 1 year or as needed.  Given tetanus vaccine and pneumococcal 20 immunization today.

## 2022-07-01 DIAGNOSIS — E119 Type 2 diabetes mellitus without complications: Secondary | ICD-10-CM | POA: Diagnosis not present

## 2022-07-01 DIAGNOSIS — Z794 Long term (current) use of insulin: Secondary | ICD-10-CM | POA: Diagnosis not present

## 2022-07-01 DIAGNOSIS — H2513 Age-related nuclear cataract, bilateral: Secondary | ICD-10-CM | POA: Diagnosis not present

## 2022-07-01 DIAGNOSIS — H25013 Cortical age-related cataract, bilateral: Secondary | ICD-10-CM | POA: Diagnosis not present

## 2022-07-01 DIAGNOSIS — H25043 Posterior subcapsular polar age-related cataract, bilateral: Secondary | ICD-10-CM | POA: Diagnosis not present

## 2022-07-01 LAB — HM DIABETES EYE EXAM

## 2022-07-16 DIAGNOSIS — M542 Cervicalgia: Secondary | ICD-10-CM | POA: Diagnosis not present

## 2022-07-16 DIAGNOSIS — M4722 Other spondylosis with radiculopathy, cervical region: Secondary | ICD-10-CM | POA: Diagnosis not present

## 2022-07-19 ENCOUNTER — Other Ambulatory Visit (HOSPITAL_COMMUNITY): Payer: Self-pay

## 2022-07-26 ENCOUNTER — Other Ambulatory Visit: Payer: Self-pay | Admitting: Neurosurgery

## 2022-07-26 DIAGNOSIS — M4722 Other spondylosis with radiculopathy, cervical region: Secondary | ICD-10-CM

## 2022-08-16 ENCOUNTER — Other Ambulatory Visit (HOSPITAL_COMMUNITY): Payer: Self-pay

## 2022-08-17 ENCOUNTER — Other Ambulatory Visit: Payer: Self-pay

## 2022-08-28 ENCOUNTER — Ambulatory Visit
Admission: RE | Admit: 2022-08-28 | Discharge: 2022-08-28 | Disposition: A | Discharge status: Home / Self Care | Payer: BC Managed Care – PPO | Source: Ambulatory Visit | Attending: Neurosurgery | Admitting: Neurosurgery

## 2022-08-28 DIAGNOSIS — R200 Anesthesia of skin: Secondary | ICD-10-CM | POA: Diagnosis not present

## 2022-08-28 DIAGNOSIS — M5416 Radiculopathy, lumbar region: Secondary | ICD-10-CM | POA: Diagnosis not present

## 2022-08-28 DIAGNOSIS — M4722 Other spondylosis with radiculopathy, cervical region: Secondary | ICD-10-CM

## 2022-09-14 ENCOUNTER — Other Ambulatory Visit: Payer: Self-pay | Admitting: Internal Medicine

## 2022-09-14 ENCOUNTER — Other Ambulatory Visit (HOSPITAL_COMMUNITY): Payer: Self-pay

## 2022-09-18 ENCOUNTER — Other Ambulatory Visit: Payer: Self-pay | Admitting: Internal Medicine

## 2022-10-11 ENCOUNTER — Other Ambulatory Visit: Payer: Self-pay

## 2022-11-08 ENCOUNTER — Other Ambulatory Visit: Payer: Self-pay

## 2022-11-29 ENCOUNTER — Other Ambulatory Visit (HOSPITAL_COMMUNITY): Payer: Self-pay

## 2022-11-29 DIAGNOSIS — I10 Essential (primary) hypertension: Secondary | ICD-10-CM | POA: Diagnosis not present

## 2022-11-29 DIAGNOSIS — K769 Liver disease, unspecified: Secondary | ICD-10-CM | POA: Diagnosis not present

## 2022-11-29 DIAGNOSIS — E1140 Type 2 diabetes mellitus with diabetic neuropathy, unspecified: Secondary | ICD-10-CM | POA: Diagnosis not present

## 2022-11-29 DIAGNOSIS — E1165 Type 2 diabetes mellitus with hyperglycemia: Secondary | ICD-10-CM | POA: Diagnosis not present

## 2022-11-29 MED ORDER — MOUNJARO 12.5 MG/0.5ML ~~LOC~~ SOAJ
12.5000 mg | SUBCUTANEOUS | 5 refills | Status: DC
  Filled 2022-11-29 – 2022-12-01 (×2): qty 2, 28d supply, fill #0
  Filled 2023-01-04: qty 2, 28d supply, fill #1
  Filled 2023-01-29: qty 2, 28d supply, fill #2
  Filled 2023-03-02: qty 2, 28d supply, fill #3
  Filled 2023-03-27: qty 2, 28d supply, fill #4
  Filled 2023-04-23: qty 2, 28d supply, fill #5

## 2022-12-02 ENCOUNTER — Other Ambulatory Visit: Payer: Self-pay

## 2022-12-07 ENCOUNTER — Other Ambulatory Visit: Payer: Self-pay

## 2022-12-08 ENCOUNTER — Encounter (HOSPITAL_COMMUNITY): Payer: Self-pay

## 2022-12-09 ENCOUNTER — Other Ambulatory Visit (HOSPITAL_COMMUNITY): Payer: Self-pay

## 2022-12-10 ENCOUNTER — Other Ambulatory Visit (HOSPITAL_COMMUNITY): Payer: Self-pay

## 2023-01-03 ENCOUNTER — Other Ambulatory Visit: Payer: Self-pay

## 2023-01-03 MED ORDER — NADOLOL 20 MG PO TABS: 20.0000 mg | ORAL_TABLET | Freq: Every day | ORAL | 2 refills | Status: DC

## 2023-01-04 ENCOUNTER — Other Ambulatory Visit (HOSPITAL_COMMUNITY): Payer: Self-pay

## 2023-01-05 ENCOUNTER — Telehealth: Payer: Self-pay

## 2023-01-05 ENCOUNTER — Other Ambulatory Visit: Payer: Self-pay

## 2023-01-05 DIAGNOSIS — K769 Liver disease, unspecified: Secondary | ICD-10-CM

## 2023-01-05 DIAGNOSIS — K7469 Other cirrhosis of liver: Secondary | ICD-10-CM

## 2023-01-05 NOTE — Telephone Encounter (Signed)
-----   Message from Nurse Joselyn Glassman sent at 01/04/2022  9:47 AM EDT -----  repeat an MRI in 1 year   Reminder sent 01/04/2022

## 2023-01-05 NOTE — Telephone Encounter (Signed)
Reminder received in Epic.  Order placed in Epic for MRI to be done at [facility]. Left message for pt to call back

## 2023-01-06 NOTE — Telephone Encounter (Signed)
Patient is active on mychart. Scheduling reminder sent to patient.

## 2023-01-08 ENCOUNTER — Other Ambulatory Visit: Payer: Self-pay | Admitting: Internal Medicine

## 2023-01-10 ENCOUNTER — Other Ambulatory Visit: Payer: Self-pay | Admitting: Internal Medicine

## 2023-01-31 ENCOUNTER — Other Ambulatory Visit: Payer: Self-pay

## 2023-02-09 ENCOUNTER — Other Ambulatory Visit: Payer: Self-pay

## 2023-02-09 MED ORDER — GABAPENTIN 300 MG PO CAPS: ORAL_CAPSULE | ORAL | 5 refills | Status: DC

## 2023-03-02 ENCOUNTER — Other Ambulatory Visit (HOSPITAL_COMMUNITY): Payer: Self-pay

## 2023-03-10 ENCOUNTER — Ambulatory Visit
Admission: RE | Admit: 2023-03-10 | Discharge: 2023-03-10 | Disposition: A | Discharge status: Home / Self Care | Payer: BC Managed Care – PPO | Source: Ambulatory Visit | Attending: Internal Medicine | Admitting: Internal Medicine

## 2023-03-10 DIAGNOSIS — K769 Liver disease, unspecified: Secondary | ICD-10-CM

## 2023-03-10 DIAGNOSIS — K760 Fatty (change of) liver, not elsewhere classified: Secondary | ICD-10-CM | POA: Diagnosis not present

## 2023-03-10 DIAGNOSIS — K7460 Unspecified cirrhosis of liver: Secondary | ICD-10-CM | POA: Diagnosis not present

## 2023-03-10 DIAGNOSIS — R161 Splenomegaly, not elsewhere classified: Secondary | ICD-10-CM | POA: Diagnosis not present

## 2023-03-10 DIAGNOSIS — K7469 Other cirrhosis of liver: Secondary | ICD-10-CM

## 2023-03-10 MED ORDER — GADOPICLENOL 0.5 MMOL/ML IV SOLN
10.0000 mL | Freq: Once | INTRAVENOUS | Status: AC | PRN
  Administered 2023-03-10: 10 mL via INTRAVENOUS

## 2023-03-27 ENCOUNTER — Other Ambulatory Visit (HOSPITAL_COMMUNITY): Payer: Self-pay

## 2023-04-04 ENCOUNTER — Encounter: Payer: Self-pay | Admitting: Internal Medicine

## 2023-04-04 NOTE — Telephone Encounter (Signed)
 Care team updated and letter sent for eye exam notes.

## 2023-04-06 ENCOUNTER — Other Ambulatory Visit: Payer: Self-pay | Admitting: Internal Medicine

## 2023-04-15 ENCOUNTER — Telehealth: Payer: Self-pay | Admitting: Internal Medicine

## 2023-04-15 NOTE — Telephone Encounter (Signed)
LVM that physical needs to be moved to 10:00 instead of 10:30 because this has doctor doing 2 physicals back to back.

## 2023-04-23 ENCOUNTER — Other Ambulatory Visit (HOSPITAL_COMMUNITY): Payer: Self-pay

## 2023-04-25 ENCOUNTER — Other Ambulatory Visit: Payer: Self-pay

## 2023-05-06 ENCOUNTER — Ambulatory Visit: Payer: Self-pay

## 2023-05-10 ENCOUNTER — Other Ambulatory Visit: Payer: BC Managed Care – PPO

## 2023-05-10 DIAGNOSIS — Z794 Long term (current) use of insulin: Secondary | ICD-10-CM | POA: Diagnosis not present

## 2023-05-10 DIAGNOSIS — Z0000 Encounter for general adult medical examination without abnormal findings: Secondary | ICD-10-CM

## 2023-05-10 DIAGNOSIS — E119 Type 2 diabetes mellitus without complications: Secondary | ICD-10-CM

## 2023-05-10 DIAGNOSIS — I10 Essential (primary) hypertension: Secondary | ICD-10-CM

## 2023-05-10 DIAGNOSIS — E7849 Other hyperlipidemia: Secondary | ICD-10-CM

## 2023-05-10 DIAGNOSIS — Z125 Encounter for screening for malignant neoplasm of prostate: Secondary | ICD-10-CM

## 2023-05-10 DIAGNOSIS — K7030 Alcoholic cirrhosis of liver without ascites: Secondary | ICD-10-CM

## 2023-05-10 DIAGNOSIS — D696 Thrombocytopenia, unspecified: Secondary | ICD-10-CM

## 2023-05-11 LAB — COMPLETE METABOLIC PANEL WITH GFR
AG Ratio: 1.4 (calc) (ref 1.0–2.5)
ALT: 17 U/L (ref 9–46)
AST: 22 U/L (ref 10–35)
Albumin: 4 g/dL (ref 3.6–5.1)
Alkaline phosphatase (APISO): 97 U/L (ref 35–144)
BUN: 14 mg/dL (ref 7–25)
CO2: 28 mmol/L (ref 20–32)
Calcium: 9.6 mg/dL (ref 8.6–10.3)
Chloride: 103 mmol/L (ref 98–110)
Creat: 0.84 mg/dL (ref 0.70–1.30)
Globulin: 2.8 g/dL (ref 1.9–3.7)
Glucose, Bld: 124 mg/dL — ABNORMAL HIGH (ref 65–99)
Potassium: 4.1 mmol/L (ref 3.5–5.3)
Sodium: 140 mmol/L (ref 135–146)
Total Bilirubin: 2.6 mg/dL — ABNORMAL HIGH (ref 0.2–1.2)
Total Protein: 6.8 g/dL (ref 6.1–8.1)
eGFR: 102 mL/min/{1.73_m2} (ref >=60)

## 2023-05-11 LAB — MICROALBUMIN / CREATININE URINE RATIO
Creatinine, Urine: 94 mg/dL (ref 20–320)
Microalb Creat Ratio: 9 mg/g{creat} (ref <30)
Microalb, Ur: 0.8 mg/dL

## 2023-05-11 LAB — CBC WITH DIFFERENTIAL/PLATELET
Absolute Lymphocytes: 1040 {cells}/uL (ref 850–3900)
Absolute Monocytes: 208 {cells}/uL (ref 200–950)
Basophils Absolute: 0 {cells}/uL (ref 0–200)
Basophils Relative: 0 %
Eosinophils Absolute: 129 {cells}/uL (ref 15–500)
Eosinophils Relative: 3.9 %
HCT: 44.7 % (ref 38.5–50.0)
Hemoglobin: 15.8 g/dL (ref 13.2–17.1)
MCH: 34.8 pg — ABNORMAL HIGH (ref 27.0–33.0)
MCHC: 35.3 g/dL (ref 32.0–36.0)
MCV: 98.5 fL (ref 80.0–100.0)
MPV: 10.5 fL (ref 7.5–12.5)
Monocytes Relative: 6.3 %
Neutro Abs: 1924 {cells}/uL (ref 1500–7800)
Neutrophils Relative %: 58.3 %
Platelets: 106 10*3/uL — ABNORMAL LOW (ref 140–400)
RBC: 4.54 10*6/uL (ref 4.20–5.80)
RDW: 13.8 % (ref 11.0–15.0)
Total Lymphocyte: 31.5 %
WBC: 3.3 10*3/uL — ABNORMAL LOW (ref 3.8–10.8)

## 2023-05-11 LAB — LIPID PANEL
Cholesterol: 142 mg/dL (ref <200)
HDL: 59 mg/dL (ref >=40)
LDL Cholesterol (Calc): 69 mg/dL
Non-HDL Cholesterol (Calc): 83 mg/dL (ref <130)
Total CHOL/HDL Ratio: 2.4 (calc) (ref <5.0)
Triglycerides: 56 mg/dL (ref <150)

## 2023-05-11 LAB — HEMOGLOBIN A1C
Hgb A1c MFr Bld: 6.5 %{Hb} — ABNORMAL HIGH (ref <5.7)
Mean Plasma Glucose: 140 mg/dL
eAG (mmol/L): 7.7 mmol/L

## 2023-05-11 LAB — PSA: PSA: 0.31 ng/mL (ref <=4.00)

## 2023-05-16 NOTE — Progress Notes (Shared)
Annual Wellness Visit   Patient Care Team: Baxley, Luanna Cole, MD as PCP - General (Internal Medicine) Tsamis, Georganna Skeans, MD as Referring Physician (Ophthalmology)  Visit Date: 05/16/23   No chief complaint on file.  Subjective:  Patient: Oscar [name], Male DOB: November 02, 1965, 58 y.o. MRN: [id number]  Oscar [name] is a 58 y.o. Male who presents today for his Annual Wellness Visit. Patient has history of Anemia, Cirrhosis, Clotting Disorder, Diabetic Gastroparesis, Type 2 Diabetes Mellitus, GERD, GI Bleed, Headaches, Hematemesis, Staph Infection, Adenomatous Polyps, Hypertension, Insomnia, Iron Deficiency, Obesity, Osteoarthritis, Neuromuscular Disorder, Thrombocytopenia In Cirrhosis, Portal Hypertension, and Adenomatous Colon Polyp.             Labs ***/***/*** CBC: *** CMP: *** Lipid Panel: *** HgbA1c: *** TSH: ***  Colonoscopy ***/***/*** was *** with repeat recommendation of ***  {Annual Women Health Maintenance:32093}  {Men Health Maintenance:32094}  Vaccine Counseling: Due for ***; UTD on *** Past Medical History:  Diagnosis Date   Allergy    Anemia    Cirrhosis (HCC)    Clotting disorder (HCC)    Constipation    related to pain meds and takes Colace daily   Diabetic gastroparesis associated with type 2 diabetes mellitus (HCC) 10/31/2017   Esophageal varices (HCC)    Fatty liver    Gastric varices    takes Propranlol for this   GERD (gastroesophageal reflux disease)    takes Nexium daily   GI bleed 09/2013   Headache(784.0)    "weekly" (10/18/2013)   Hematemesis 10/18/2013   hospitalized   Hepatitis yrs ago   hx of Hep A   History of colon polyps    History of staph infection 2014   Hx of adenomatous colonic polyps 12/18/2017   Hypertension    takes Atenolol,HCTZ,and Lisinopril daily   Insomnia    but doesn't take any meds   Iron deficiency anemia due to chronic blood loss 11/04/2017   Liver cirrhosis secondary to NASH (HCC) +  alcohol use? contribution 02/21/2011   Neck pain    HNP and radiculpathy   Neuromuscular disorder (HCC)    Numbness hands and fingers   and tingling   Obesity    Osteoarthritis    "hands; elbows; knees" (10/18/2013)   Pancytopenia (HCC)    but never had a blood transfusion, had to take iron transfusion  sept 2014   Sleep apnea    sleep study in epic from 2006 but doesn't use a cpap (10/18/2013)   Thrombocytopenia (HCC) in cirrhosis and portal hypertension 06/25/2016   Type II diabetes mellitus (HCC)    takes Novolin and Novolog and takes Janumet daily   Urinary frequency     Family History  Problem Relation Age of Onset   Hypertension Mother    Heart failure Mother    Pancreatitis Father    Kidney cancer Father    Brain cancer Sister    Lung cancer Sister    Kidney disease Brother    Diabetes Brother    Colon cancer Neg Hx    Stomach cancer Neg Hx    Liver disease Neg Hx    Esophageal cancer Neg Hx    Rectal cancer Neg Hx     Social History   Social History Narrative   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 Physical Exam Most recent functional status assessment:     No data to display         Most recent fall risk assessment:    06/21/2022    3:07 PM  Fall Risk   Falls in the past year? 0  Number falls in past yr: 0  Injury with Fall? 0  Risk for fall due to : No Fall Risks  Follow up Falls prevention discussed    Most recent depression screenings:    06/21/2022    3:07 PM 02/24/2021    2:17 PM  PHQ 2/9 Scores  PHQ - 2 Score 0 0   Most recent cognitive screening:     No data to display         Results:  Studies obtained and personally reviewed by me:  *** Imaging, colonoscopy, mammogram, bone density scan, echocardiogram, heart cath, stress test, CT calcium score, etc.   Labs:      Component Value Date/Time   NA 140 05/10/2023 0944   NA 136 10/07/2016 1517   K 4.1 05/10/2023 0944   K 4.1 10/07/2016 1517   CL 103 05/10/2023 0944   CL 102 10/07/2016 1517   CO2 28 05/10/2023 0944   CO2 25 10/07/2016 1517   GLUCOSE 124 (H) 05/10/2023 0944   GLUCOSE 366 (H) 10/07/2016 1517   BUN 14 05/10/2023 0944   BUN 11 10/07/2016 1517   CREATININE 0.84 05/10/2023 0944   CALCIUM 9.6 05/10/2023 0944   CALCIUM 9.6 10/07/2016 1517   PROT 6.8 05/10/2023 0944   PROT 6.8 10/07/2016 1517   ALBUMIN 3.9 08/19/2021 1025   ALBUMIN 3.5 10/07/2016 1517   ALBUMIN 3.6 04/08/2016 1452   AST 22 05/10/2023 0944   AST 19 11/01/2017 1338   ALT 17 05/10/2023 0944   ALT 21 11/01/2017 1338   ALT 35 10/07/2016 1517   ALKPHOS 88 08/19/2021 1025   ALKPHOS 129 (H) 10/07/2016 1517   BILITOT 2.6 (H) 05/10/2023 0944   BILITOT 1.0 11/01/2017 1338   GFRNONAA 91 02/15/2020 0914   GFRAA 105 02/15/2020 0914     Lab Results  Component Value Date   WBC 3.3 (L) 05/10/2023   HGB 15.8 05/10/2023   HCT 44.7 05/10/2023   MCV 98.5 05/10/2023   PLT 106 (L) 05/10/2023    Lab Results  Component Value Date   CHOL 142 05/10/2023   HDL 59 05/10/2023   LDLCALC 69 05/10/2023   TRIG 56 05/10/2023   CHOLHDL 2.4 05/10/2023    Lab Results  Component Value Date   HGBA1C 6.5 (H) 05/10/2023     Lab Results  Component Value Date   TSH 1.22 11/22/2016     *** Delete for Male ***  Lab Results  Component Value Date   PSA 0.31 05/10/2023   PSA 0.30 06/18/2022   PSA 0.37 02/19/2021    Assessment & Plan:    PAP Smear: ***/***/*** was *** with repeat recommendation of ***  Mammogram: ***/***/*** was *** with repeat recommendation of ***  Colonoscopy: ***/***/*** was *** with repeat recommendation of ***  Bone DEXA: ***/***/*** was *** with repeat recommendation of ***  Vaccine Counseling: Due for ***; UTD on ***    Annual wellness visit done today including the all of the  following: Reviewed patient's Family Medical History Reviewed and updated list of patient's medical providers Assessment of cognitive impairment was done Assessed patient's functional ability Established a written schedule for  health screening services Health Risk Assessent Completed and Reviewed  Discussed health benefits of physical activity, and encouraged him to engage in regular exercise appropriate for his age and condition.        I,Emily Lagle,acting as a Neurosurgeon for Margaree Mackintosh, MD.,have documented all relevant documentation on the behalf of Margaree Mackintosh, MD,as directed by  Margaree Mackintosh, MD while in the presence of Margaree Mackintosh, MD.   ***

## 2023-05-17 ENCOUNTER — Encounter: Payer: BC Managed Care – PPO | Admitting: Internal Medicine

## 2023-05-24 ENCOUNTER — Other Ambulatory Visit (HOSPITAL_COMMUNITY): Payer: Self-pay

## 2023-05-24 ENCOUNTER — Encounter: Payer: Self-pay | Admitting: Hematology & Oncology

## 2023-05-24 MED ORDER — MOUNJARO 12.5 MG/0.5ML ~~LOC~~ SOAJ
12.5000 mg | SUBCUTANEOUS | 5 refills | Status: DC
  Filled 2023-05-24: qty 2, 28d supply, fill #0
  Filled 2023-06-17: qty 6, 84d supply, fill #1
  Filled 2023-09-18: qty 4, 56d supply, fill #2

## 2023-06-01 DIAGNOSIS — E7800 Pure hypercholesterolemia, unspecified: Secondary | ICD-10-CM | POA: Diagnosis not present

## 2023-06-01 DIAGNOSIS — E669 Obesity, unspecified: Secondary | ICD-10-CM | POA: Diagnosis not present

## 2023-06-01 DIAGNOSIS — E1165 Type 2 diabetes mellitus with hyperglycemia: Secondary | ICD-10-CM | POA: Diagnosis not present

## 2023-06-04 ENCOUNTER — Other Ambulatory Visit: Payer: Self-pay | Admitting: Internal Medicine

## 2023-06-08 DIAGNOSIS — I10 Essential (primary) hypertension: Secondary | ICD-10-CM | POA: Diagnosis not present

## 2023-06-08 DIAGNOSIS — E1165 Type 2 diabetes mellitus with hyperglycemia: Secondary | ICD-10-CM | POA: Diagnosis not present

## 2023-06-08 DIAGNOSIS — E1140 Type 2 diabetes mellitus with diabetic neuropathy, unspecified: Secondary | ICD-10-CM | POA: Diagnosis not present

## 2023-06-08 DIAGNOSIS — K769 Liver disease, unspecified: Secondary | ICD-10-CM | POA: Diagnosis not present

## 2023-06-17 ENCOUNTER — Other Ambulatory Visit (HOSPITAL_COMMUNITY): Payer: Self-pay

## 2023-06-20 ENCOUNTER — Other Ambulatory Visit: Payer: Self-pay

## 2023-06-22 ENCOUNTER — Other Ambulatory Visit: Payer: Self-pay

## 2023-06-23 ENCOUNTER — Other Ambulatory Visit: Payer: Self-pay

## 2023-07-13 NOTE — Progress Notes (Shared)
 Annual Wellness Visit   Patient Care Team: Baxley, Jaynie Meyers, MD as PCP - General (Internal Medicine) Tsamis, Ollie Bhat, MD as Referring Physician (Ophthalmology)  Visit Date: 08/18/23   No chief complaint on file.  Subjective:  Patient: Oscar [name], Male DOB: 1966/03/29, 58 y.o. MRN: [id number]  Oscar [name] is a 58 y.o. Male who presents today for his Annual Wellness Visit. Patient has Hypertension; Hyperlipidemia; NASH/ASH Cirrhosis; Insulin -Dependent Diabetes Mellitus; Cervical Herniated Disc; GERD (Gastroesophageal Reflux Disease); Secondary Esophageal Varices w/o Bleeding; Obesity, Morbid, BMI 40.0-49.9; Metabolic Syndrome; Thrombocytopenia In Cirrhosis and Portal Hypertension; Portal Hypertensive Gastropathy; Dyspepsia; Diabetic Gastroparesis Associated w/ Type 2 Diabetes Mellitus; Iron Deficiency Anemia Due To Chronic Blood Loss; Hx Of Adenomatous Colonic Polyps; Lesion Of Liver; and RLQ Abdominal Pain.  History of Hypertension treated with HCTZ 25 mg daily and Lisinopril  20 mg daily. Blood Pressure: normotensive today at 102/70.   History of Hyperlipidemia with  05/10/2023 Lipid Panel WNL.  History of Diabetes Mellitus, type II treated with Metformin  1000 mg twice daily, Farxiga 10 mg daily, and Mounjaro  12.5 mg weekly.  05/10/2023 Glucose 124, decreased from 135; HgbA1c: 6.5, down from 6.8. Diabetic Neuropathy treated with Gabapentin  600 mg three times daily. Followed by Dr. Ronelle Coffee, Endocrinologist.   History of GERD treated with Nexium  40 mg daily. Upper Endoscopy 06/07/2020 found small Grade II Esophageal Varices measuring < 5 mm; Portal Hypertensive Gastropathy; Multiple Eroded Papules in the stomach were biopsied. Surgical pathology determined reactive gastropathy w/ hyperplastic changes, acute and chronic inflammation, no H. Pylori or intestinal metaplasia identified.   History of Low WBC; Thrombocytopenia; Benign Neutropenia with  05/10/2023  CBC, compared to  05/2022: WBC 3.3, elevated from 3.1; MCH 34.8, elevated from 34.2; Platelets 106, decreased from 108; otherwise WNL.  History of Obesity; BMI 43;64, weight 282 pounds 12.8 oz.      Labs 05/10/2023 CMP, compared to 05/2022: Glucose 124, decreased from 135; Total Bilirubin 2.6, elevated from 1.1; otherwise WNL.   Colonoscopy 10/23/2021 removed one sessile diminutive Polyp from the transverse colon; Multiple Diverticula throughout the sigmoid, descending, and transverse colon; Internal Hemorrhoids; Small Rectal Varices; Patchy area of white discolored mucosa in the transverse colon was biopsied. Surgical Pathology determined that the polyp was a benign colonic mucosa w/ lymphoid aggregate along with the mucosa which was also w/o diagnostic abnormality. Repeat recommendation of 2028.  PSA  0.31  05/10/2023   Vaccine Counseling: Due for Covid-19; UTD on Flu, Shingles 2/2, PNA, and Tdap. Past Medical History:  Diagnosis Date   Allergy    Anemia    Cirrhosis (HCC)    Clotting disorder (HCC)    Constipation    related to pain meds and takes Colace daily   Diabetic gastroparesis associated with type 2 diabetes mellitus (HCC) 10/31/2017   Esophageal varices (HCC)    Fatty liver    Gastric varices    takes Propranlol for this   GERD (gastroesophageal reflux disease)    takes Nexium  daily   GI bleed 09/2013   Headache(784.0)    "weekly" (10/18/2013)   Hematemesis 10/18/2013   hospitalized   Hepatitis yrs ago   hx of Hep A   History of colon polyps    History of staph infection 2014   Hx of adenomatous colonic polyps 12/18/2017   Hypertension    takes Atenolol ,HCTZ,and Lisinopril  daily   Insomnia    but doesn't take any meds   Iron deficiency anemia due to chronic blood loss 11/04/2017  Liver cirrhosis secondary to NASH [facility]) + alcohol use? contribution 02/21/2011   Neck pain    HNP and radiculpathy   Neuromuscular disorder (HCC)    Numbness hands and fingers   and tingling   Obesity     Osteoarthritis    "hands; elbows; knees" (10/18/2013)   Pancytopenia (HCC)    but never had a blood transfusion, had to take iron transfusion  sept 2014   Sleep apnea    sleep study in epic from 2006 but doesn't use a cpap (10/18/2013)   Thrombocytopenia (HCC) in cirrhosis and portal hypertension 06/25/2016   Type II diabetes mellitus (HCC)    takes Novolin and Novolog  and takes Janumet  daily   Urinary frequency   Medical/Surgical History Narrative:  Allergic/Intolerant to: [place]  10 mcg Pen/Exenatide  - nausea/vomiting  2015/2017 - C5-6 Anterior Discectomy by Dr. Larrie Po; in 2017 developed left cervical pain and received injection at C2-C5 at [facility] by Dr. Eben Golds. MRI in 2017 showed good appearance of the fusion level of C5-6, grossly non-compressive minimal disc bulges at C3-4 & C4-5  2006 - seen by Dr. Linder Revere in 2006 for Sleep Apnea, treated with a CPAP Family History  Problem Relation Age of Onset   Hypertension Mother    Heart failure Mother    Pancreatitis Father    Kidney cancer Father    Brain cancer Sister    Lung cancer Sister    Kidney disease Brother    Diabetes Brother    Colon cancer Neg Hx    Stomach cancer Neg Hx    Liver disease Neg Hx    Esophageal cancer Neg Hx    Rectal cancer Neg Hx     Social History   Social History Narrative   Married. No children. No longer drinks alcohol or uses drugs; non-smoker. 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  HENT:  Negative for hearing loss, sinus pain and sore throat.   Respiratory:  Negative for cough, hemoptysis and shortness of breath.   Cardiovascular:  Negative for chest pain,  palpitations, leg swelling and PND.  Gastrointestinal:  Negative for abdominal pain, constipation, diarrhea, heartburn, nausea and vomiting.  Genitourinary:  Negative for dysuria, frequency and urgency.  Musculoskeletal:  Negative for back pain, myalgias and neck pain.  Skin:  Negative for itching and rash.  Neurological:  Negative for dizziness, tingling, seizures and headaches.  Endo/Heme/Allergies:  Negative for polydipsia.  Psychiatric/Behavioral:  Negative for depression. The patient is not nervous/anxious.     Objective:  Vitals: There were no vitals taken for this visit. Physical Exam Vitals and nursing note reviewed.  Constitutional:      General: He is awake. He is not in acute distress.    Appearance: Normal appearance. He is not ill-appearing or toxic-appearing.  HENT:     Head: Normocephalic and atraumatic.     Right Ear: Tympanic membrane, ear canal and external ear normal.     Left Ear: Tympanic membrane, ear canal and external ear normal.     Mouth/Throat:     Pharynx: Oropharynx is clear.  Eyes:     Extraocular  Movements: Extraocular movements intact.     Pupils: Pupils are equal, round, and reactive to light.  Neck:     Thyroid : No thyroid  mass, thyromegaly or thyroid  tenderness.     Vascular: No carotid bruit.  Cardiovascular:     Rate and Rhythm: Normal rate and regular rhythm. No extrasystoles are present.    Pulses:          Dorsalis pedis pulses are 1+ on the right side and 1+ on the left side.     Heart sounds: Normal heart sounds. No murmur heard.    No friction rub. No gallop.  Pulmonary:     Effort: Pulmonary effort is normal.     Breath sounds: Normal breath sounds. No decreased breath sounds, wheezing, rhonchi or rales.  Chest:     Chest wall: No mass.  Abdominal:     Palpations: Abdomen is soft. There is no hepatomegaly, splenomegaly or mass.     Tenderness: There is no abdominal tenderness.     Hernia: No hernia is present.  Genitourinary:     Prostate: Normal. Not enlarged, not tender and no nodules present.     Rectum: Guaiac result negative.  Musculoskeletal:     Cervical back: Normal range of motion.     Right lower leg: No edema.     Left lower leg: No edema.  Lymphadenopathy:     Cervical: No cervical adenopathy.     Upper Body:     Right upper body: No supraclavicular adenopathy.     Left upper body: No supraclavicular adenopathy.  Skin:    General: Skin is warm and dry.  Neurological:     General: No focal deficit present.     Mental Status: He is alert and oriented to person, place, and time. Mental status is at baseline.     Cranial Nerves: Cranial nerves 2-12 are intact.     Sensory: Sensation is intact.     Motor: Motor function is intact.     Coordination: Coordination is intact.     Gait: Gait is intact.     Deep Tendon Reflexes: Reflexes are normal and symmetric.  Psychiatric:        Attention and Perception: Attention normal.        Mood and Affect: Mood normal.        Speech: Speech normal.        Behavior: Behavior normal. Behavior is cooperative.        Thought Content: Thought content normal.        Cognition and Memory: Cognition and memory normal.        Judgment: Judgment normal.   Most Recent Fall Risk Assessment:    08/18/2023   11:25 AM  Fall Risk   Falls in the past year? 0  Number falls in past yr: 0  Injury with Fall? 0  Risk for fall due to : No Fall Risks  Follow up Falls evaluation completed   Most Recent Depression Screenings:    08/18/2023   12:38 PM 06/21/2022    3:07 PM  PHQ 2/9 Scores  PHQ - 2 Score 0 0   Results:  Studies Obtained And Personally Reviewed By Me:  Upper Endoscopy 06/07/2020 found small Grade II Esophageal Varices measuring < 5 mm; Portal Hypertensive Gastropathy; Multiple Eroded Papules in the stomach were biopsied. Surgical pathology determined reactive gastropathy w/ hyperplastic changes, acute and chronic inflammation, no H. Pylori or intestinal  metaplasia identified.   Colonoscopy 10/23/2021 removed one  sessile diminutive Polyp from the transverse colon; Multiple Diverticula throughout the sigmoid, descending, and transverse colon; Internal Hemorrhoids; Small Rectal Varices; Patchy area of white discolored mucosa in the transverse colon was biopsied. Surgical Pathology determined that the polyp was a benign colonic mucosa w/ lymphoid aggregate along with the mucosa which was also w/o diagnostic abnormality. Repeat recommendation of 2028.  Labs:     Component Value Date/Time   NA 140 05/10/2023 0944   NA 136 10/07/2016 1517   K 4.1 05/10/2023 0944   K 4.1 10/07/2016 1517   CL 103 05/10/2023 0944   CL 102 10/07/2016 1517   CO2 28 05/10/2023 0944   CO2 25 10/07/2016 1517   GLUCOSE 124 (H) 05/10/2023 0944   GLUCOSE 366 (H) 10/07/2016 1517   BUN 14 05/10/2023 0944   BUN 11 10/07/2016 1517   CREATININE 0.84 05/10/2023 0944   CALCIUM 9.6 05/10/2023 0944   CALCIUM 9.6 10/07/2016 1517   PROT 6.8 05/10/2023 0944   PROT 6.8 10/07/2016 1517   ALBUMIN 3.9 08/19/2021 1025   ALBUMIN 3.5 10/07/2016 1517   ALBUMIN 3.6 04/08/2016 1452   AST 22 05/10/2023 0944   AST 19 11/01/2017 1338   ALT 17 05/10/2023 0944   ALT 21 11/01/2017 1338   ALT 35 10/07/2016 1517   ALKPHOS 88 08/19/2021 1025   ALKPHOS 129 (H) 10/07/2016 1517   BILITOT 2.6 (H) 05/10/2023 0944   BILITOT 1.0 11/01/2017 1338   GFRNONAA 91 02/15/2020 0914   GFRAA 105 02/15/2020 0914    Lab Results  Component Value Date   WBC 3.3 (L) 05/10/2023   HGB 15.8 05/10/2023   HCT 44.7 05/10/2023   MCV 98.5 05/10/2023   PLT 106 (L) 05/10/2023   Lab Results  Component Value Date   CHOL 142 05/10/2023   HDL 59 05/10/2023   LDLCALC 69 05/10/2023   TRIG 56 05/10/2023   CHOLHDL 2.4 05/10/2023   Lab Results  Component Value Date   HGBA1C 6.5 (H) 05/10/2023    Lab Results  Component Value Date   TSH 1.22 11/22/2016    Lab Results  Component Value Date   PSA 0.31  05/10/2023   PSA 0.30 06/18/2022   PSA 0.37 02/19/2021     Assessment & Plan:  No orders of the defined types were placed in this encounter. Other Labs Reviewed today: CMP, compared to 05/2022: Glucose 124, decreased from 135; Total Bilirubin 2.6, elevated from 1.1; otherwise WNL.  Hypertension treated with HCTZ 25 mg daily and Lisinopril  20 mg daily. Blood Pressure: normotensive today at 102/70.   Hyperlipidemia with  05/10/2023 Lipid Panel WNL.  Diabetes Mellitus, type II treated with Metformin  1000 mg twice daily, Farxiga 10 mg daily, and Mounjaro  12.5 mg weekly.  05/10/2023 Glucose 124, decreased from 135; HgbA1c: 6.5, down from 6.8. Diabetic Neuropathy treated with Gabapentin  600 mg three times daily. Followed by Dr. Ronelle Coffee, Endocrinologist.   GERD treated with Nexium  40 mg daily. Upper Endoscopy 06/07/2020 found small Grade II Esophageal Varices measuring < 5 mm; Portal Hypertensive Gastropathy; Multiple Eroded Papules in the stomach were biopsied. Surgical pathology determined reactive gastropathy w/ hyperplastic changes, acute and chronic inflammation, no H. Pylori or intestinal metaplasia identified.   Low WBC; Thrombocytopenia; Hx of Benign Neutropenia with  05/10/2023  CBC, compared to 05/2022: WBC 3.3, elevated from 3.1; MCH 34.8, elevated from 34.2; Platelets 106, decreased from 108; otherwise WNL.  Obesity; BMI 43;64, weight 282 pounds 12.8 oz.      Colonoscopy 10/23/2021  removed one sessile diminutive Polyp from the transverse colon; Multiple Diverticula throughout the sigmoid, descending, and transverse colon; Internal Hemorrhoids; Small Rectal Varices; Patchy area of white discolored mucosa in the transverse colon was biopsied. Surgical Pathology determined that the polyp was a benign colonic mucosa w/ lymphoid aggregate along with the mucosa which was also w/o diagnostic abnormality. Repeat recommendation of 2028.  PSA  0.31  05/10/2023   Vaccine Counseling: Due for Covid-19; UTD on  Flu, Shingles 2/2, PNA, and Tdap.   Annual wellness visit done today including the all of the following: Reviewed patient's Family Medical History Reviewed and updated list of patient's medical providers Assessment of cognitive impairment was done Assessed patient's functional ability Established a written schedule for health screening services Health Risk Assessent Completed and Reviewed  Discussed health benefits of physical activity, and encouraged him to engage in regular exercise appropriate for his age and condition.    I,Emily Lagle,acting as a Neurosurgeon for Sylvan Evener, MD.,have documented all relevant documentation on the behalf of Sylvan Evener, MD,as directed by  Sylvan Evener, MD while in the presence of Sylvan Evener, MD.   ***

## 2023-07-26 ENCOUNTER — Encounter: Payer: BC Managed Care – PPO | Admitting: Internal Medicine

## 2023-08-11 ENCOUNTER — Other Ambulatory Visit: Payer: Self-pay | Admitting: Internal Medicine

## 2023-08-11 NOTE — Telephone Encounter (Signed)
 Copied from CRM [id number]. Topic: Clinical - Medication Refill >> Aug 11, 2023 12:20 PM Jethro Morrison wrote: Most Recent Primary Care Visit:  Provider: [facility]  Department: [facility]  Visit Type: LAB VISIT  Date: 05/10/2023  Medication: gabapentin (NEURONTIN) 300 MG capsule  Has the patient contacted their pharmacy? Yes (Agent: If no, request that the patient contact the pharmacy for the refill. If patient does not wish to contact the pharmacy document the reason why and proceed with request.) (Agent: If yes, when and what did the pharmacy advise?)  Is this the correct pharmacy for this prescription? Yes If no, delete pharmacy and type the correct one.  This is the patient's preferred pharmacy:  [facility] [address] [address] [address] [place] [id number] [phone]   Has the prescription been filled recently? Yes  Is the patient out of the medication? No  Has the patient been seen for an appointment in the last year OR does the patient have an upcoming appointment? Yes  Can we respond through MyChart? Yes  Agent: Please be advised that Rx refills may take up to 3 business days. We ask that you follow-up with your pharmacy.

## 2023-08-12 NOTE — Telephone Encounter (Signed)
 Called patient to verify pharmacy.  Left vm.

## 2023-08-15 ENCOUNTER — Other Ambulatory Visit: Payer: Self-pay | Admitting: Internal Medicine

## 2023-08-15 NOTE — Telephone Encounter (Signed)
 Copied from CRM [id number]. Topic: Clinical - Medication Refill >> Aug 15, 2023 11:11 AM Lorrane Rosette wrote: Most Recent Primary Care Visit:  Provider: [facility]  Department: [facility]  Visit Type: LAB VISIT  Date: 05/10/2023  Medication: gabapentin  (NEURONTIN ) 300 MG capsule  Has the patient contacted their pharmacy? Yes  Is this the correct pharmacy for this prescription? Yes If no, delete pharmacy and type the correct one.  This is the patient's preferred pharmacy:  [facility] [address] [place] [address] Phone: [phone] Fax: [phone]  Has the prescription been filled recently? No  Is the patient out of the medication? Yes  Has the patient been seen for an appointment in the last year OR does the patient have an upcoming appointment? Yes  Can we respond through MyChart? Yes  Agent: Please be advised that Rx refills may take up to 3 business days. We ask that you follow-up with your pharmacy.

## 2023-08-16 ENCOUNTER — Other Ambulatory Visit: Payer: Self-pay | Admitting: Family

## 2023-08-18 ENCOUNTER — Encounter: Payer: Self-pay | Admitting: Internal Medicine

## 2023-08-18 ENCOUNTER — Ambulatory Visit: Admitting: Internal Medicine

## 2023-08-18 ENCOUNTER — Other Ambulatory Visit (HOSPITAL_COMMUNITY): Payer: Self-pay

## 2023-08-18 VITALS — BP 102/70 | HR 67 | Temp 99.8°F | Ht 67.5 in | Wt 282.8 lb

## 2023-08-18 DIAGNOSIS — Z8639 Personal history of other endocrine, nutritional and metabolic disease: Secondary | ICD-10-CM

## 2023-08-18 DIAGNOSIS — Z7984 Long term (current) use of oral hypoglycemic drugs: Secondary | ICD-10-CM

## 2023-08-18 DIAGNOSIS — I10 Essential (primary) hypertension: Secondary | ICD-10-CM

## 2023-08-18 DIAGNOSIS — E119 Type 2 diabetes mellitus without complications: Secondary | ICD-10-CM

## 2023-08-18 DIAGNOSIS — Z0000 Encounter for general adult medical examination without abnormal findings: Secondary | ICD-10-CM | POA: Diagnosis not present

## 2023-08-18 DIAGNOSIS — K7030 Alcoholic cirrhosis of liver without ascites: Secondary | ICD-10-CM

## 2023-08-18 DIAGNOSIS — E669 Obesity, unspecified: Secondary | ICD-10-CM

## 2023-08-18 DIAGNOSIS — D696 Thrombocytopenia, unspecified: Secondary | ICD-10-CM

## 2023-08-18 DIAGNOSIS — Z8719 Personal history of other diseases of the digestive system: Secondary | ICD-10-CM

## 2023-08-18 DIAGNOSIS — K219 Gastro-esophageal reflux disease without esophagitis: Secondary | ICD-10-CM

## 2023-08-18 DIAGNOSIS — I8510 Secondary esophageal varices without bleeding: Secondary | ICD-10-CM

## 2023-08-18 DIAGNOSIS — E785 Hyperlipidemia, unspecified: Secondary | ICD-10-CM

## 2023-08-18 DIAGNOSIS — Z6841 Body Mass Index (BMI) 40.0 and over, adult: Secondary | ICD-10-CM

## 2023-08-18 DIAGNOSIS — Z860101 Personal history of adenomatous and serrated colon polyps: Secondary | ICD-10-CM

## 2023-08-18 LAB — POCT URINALYSIS DIP (CLINITEK)
Bilirubin, UA: NEGATIVE
Blood, UA: NEGATIVE
Glucose, UA: >=1000 mg/dL — AB
Ketones, POC UA: NEGATIVE mg/dL
Leukocytes, UA: NEGATIVE
Nitrite, UA: NEGATIVE
Spec Grav, UA: 1.01 (ref 1.010–1.025)
Urobilinogen, UA: 2 U/dL — AB
pH, UA: 6.5 (ref 5.0–8.0)

## 2023-08-18 MED ORDER — GABAPENTIN 300 MG PO CAPS
300.0000 mg | ORAL_CAPSULE | Freq: Three times a day (TID) | ORAL | 3 refills | Status: DC
  Filled 2023-08-18: qty 90, 30d supply, fill #0

## 2023-08-18 NOTE — Progress Notes (Signed)
 ISylvan Evener, MD, have reviewed all documentation for this visit. The documentation on 08/18/23 for the exam, diagnosis, procedures, and orders are all accurate and complete.

## 2023-08-19 ENCOUNTER — Encounter: Payer: Self-pay | Admitting: Internal Medicine

## 2023-08-19 NOTE — Patient Instructions (Addendum)
 It was a pleasure to see you today. Continue diet and exercise efforts. Hgb AIC is 6.5% No change in medications. Return in one year or as needed.

## 2023-08-19 NOTE — Progress Notes (Addendum)
 Annual Wellness Visit   Patient Care Team: Carmie Lanpher, Jaynie Meyers, MD as PCP - General (Internal Medicine) Tsamis, Ollie Bhat, MD as Referring Physician (Ophthalmology)  Visit Date: 08/18/23   No chief complaint on file.  Subjective:  Patient: Oscar [name], Male DOB: 1965-07-28, 58 y.o. MRN: [id number]  Oscar [name] is a 58 y.o. Male who presents today for his Annual Wellness Visit. Patient has Hypertension; Hyperlipidemia; NASH/ASH Cirrhosis; Insulin -Dependent Diabetes Mellitus; Cervical Herniated Disc; GERD (Gastroesophageal Reflux Disease); Secondary Esophageal Varices w/o Bleeding; Obesity, Morbid, BMI 40.0-49.9; Metabolic Syndrome; Thrombocytopenia In Cirrhosis and Portal Hypertension; Portal Hypertensive Gastropathy; Dyspepsia; Diabetic Gastroparesis Associated w/ Type 2 Diabetes Mellitus; Iron Deficiency Anemia Due To Chronic Blood Loss; Hx Of Adenomatous Colonic Polyps; Lesion Of Liver; and RLQ Abdominal Pain.  History of Hypertension treated with HCTZ 25 mg daily and Lisinopril  20 mg daily. Blood Pressure: normotensive today at 102/70.   History of Hyperlipidemia with  05/10/2023 Lipid Panel WNL.  History of Diabetes Mellitus, type II treated with Metformin  1000 mg twice daily, Farxiga 10 mg daily, and Mounjaro  12.5 mg weekly.  05/10/2023 Glucose 124, decreased from 135; HgbA1c: 6.5, down from 6.8. Diabetic Neuropathy treated with Gabapentin  600 mg three times daily. Followed by Dr. Ronelle Coffee, Endocrinologist.   History of GERD treated with Nexium  40 mg daily. Upper Endoscopy 06/07/2020 found small Grade II Esophageal Varices measuring < 5 mm; Portal Hypertensive Gastropathy; Multiple Eroded Papules in the stomach were biopsied. Surgical pathology determined reactive gastropathy w/ hyperplastic changes, acute and chronic inflammation, no H. Pylori or intestinal metaplasia identified.   History of Low WBC; Thrombocytopenia; Benign Neutropenia with  05/10/2023  CBC, compared to  05/2022: WBC 3.3, elevated from 3.1; MCH 34.8, elevated from 34.2; Platelets 106, decreased from 108; otherwise WNL.  History of Obesity; BMI 43;64, weight 282 pounds 12.8 oz.      Labs 05/10/2023 CMP, compared to 05/2022: Glucose 124, decreased from 135; Total Bilirubin 2.6, elevated from 1.1; otherwise WNL.  Colonoscopy 10/23/2021 removed one sessile diminutive Polyp from the transverse colon; Multiple Diverticula throughout the sigmoid, descending, and transverse colon; Internal Hemorrhoids; Small Rectal Varices; Patchy area of white discolored mucosa in the transverse colon was biopsied. Surgical Pathology determined that the polyp was a benign colonic mucosa w/ lymphoid aggregate along with the mucosa which was also w/o diagnostic abnormality. Repeat recommendation of 2028.  PSA  0.31  05/10/2023   Vaccine Counseling: Due for Covid-19; UTD on Flu, Shingles 2/2, PNA, and Tdap. Past Medical History:  Diagnosis Date   Allergy    Anemia    Cirrhosis (HCC)    Clotting disorder (HCC)    Constipation    related to pain meds and takes Colace daily   Diabetic gastroparesis associated with type 2 diabetes mellitus (HCC) 10/31/2017   Esophageal varices (HCC)    Fatty liver    Gastric varices    takes Propranlol for this   GERD (gastroesophageal reflux disease)    takes Nexium  daily   GI bleed 09/2013   Headache(784.0)    "weekly" (10/18/2013)   Hematemesis 10/18/2013   hospitalized   Hepatitis yrs ago   hx of Hep A   History of colon polyps    History of staph infection 2014   Hx of adenomatous colonic polyps 12/18/2017   Hypertension    takes Atenolol ,HCTZ,and Lisinopril  daily   Insomnia    but doesn't take any meds   Iron deficiency anemia due to chronic blood loss 11/04/2017   Liver  cirrhosis secondary to NASH [facility]) + alcohol use? contribution 02/21/2011   Neck pain    HNP and radiculpathy   Neuromuscular disorder (HCC)    Numbness hands and fingers   and tingling   Obesity     Osteoarthritis    "hands; elbows; knees" (10/18/2013)   Pancytopenia (HCC)    but never had a blood transfusion, had to take iron transfusion  sept 2014   Sleep apnea    sleep study in epic from 2006 but doesn't use a cpap (10/18/2013)   Thrombocytopenia (HCC) in cirrhosis and portal hypertension 06/25/2016   Type II diabetes mellitus (HCC)    takes Novolin and Novolog  and takes Janumet  daily   Urinary frequency   Medical/Surgical History Narrative:  Allergic/Intolerant to: Byetta  10 mcg Pen/Exenatide  - nausea/vomiting  2015/2017 - C5-6 Anterior Discectomy by Dr. Larrie Po; in 2017 developed left cervical pain and received injection at C2-C5 at [facility] by Dr. Eben Golds. MRI in 2017 showed good appearance of the fusion level of C5-6, grossly non-compressive minimal disc bulges at C3-4 & C4-5  2006 - seen by Dr. Linder Revere in 2006 for Sleep Apnea, treated with a CPAP Family History  Problem Relation Age of Onset   Hypertension Mother    Heart failure Mother    Pancreatitis Father    Kidney cancer Father    Brain cancer Sister    Lung cancer Sister    Kidney disease Brother    Diabetes Brother    Colon cancer Neg Hx    Stomach cancer Neg Hx    Liver disease Neg Hx    Esophageal cancer Neg Hx    Rectal cancer Neg Hx    Social History   Social History Narrative   Married. No children. No longer drinks alcohol or uses drugs; non-smoker. 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  HENT:  Negative for hearing loss, sinus pain and sore throat.   Respiratory:  Negative for cough, hemoptysis and shortness of breath.   Cardiovascular:  Negative for chest pain,  palpitations, leg swelling and PND.  Gastrointestinal:  Negative for abdominal pain, constipation, diarrhea, heartburn, nausea and vomiting.  Genitourinary:  Negative for dysuria, frequency and urgency.  Musculoskeletal:  Negative for back pain, myalgias and neck pain.  Skin:  Negative for itching and rash.  Neurological:  Negative for dizziness, tingling, seizures and headaches.  Endo/Heme/Allergies:  Negative for polydipsia.  Psychiatric/Behavioral:  Negative for depression. The patient is not nervous/anxious.     Objective:  Vitals: BP 102/70 pulse 67 regular, T 99.8 degrees, Weight 282 lb 12.8 oz, BMI 43.64, Ht 5 ft 7.5 in Physical Exam Vitals and nursing note reviewed.  Constitutional:      General: He is awake. He is not in acute distress.    Appearance: Normal appearance. He is not ill-appearing or toxic-appearing.  HENT:     Head: Normocephalic and atraumatic.     Right Ear: Tympanic membrane, ear canal and external ear normal.     Left Ear: Tympanic membrane, ear canal and external ear normal.     Mouth/Throat:  Pharynx: Oropharynx is clear.  Eyes:     Extraocular Movements: Extraocular movements intact.     Pupils: Pupils are equal, round, and reactive to light.  Neck:     Thyroid : No thyroid  mass, thyromegaly or thyroid  tenderness.     Vascular: No carotid bruit.  Cardiovascular:     Rate and Rhythm: Normal rate and regular rhythm. No extrasystoles are present.    Pulses:          Dorsalis pedis pulses are 1+ on the right side and 1+ on the left side.     Heart sounds: Normal heart sounds. No murmur heard.    No friction rub. No gallop.  Pulmonary:     Effort: Pulmonary effort is normal.     Breath sounds: Normal breath sounds. No decreased breath sounds, wheezing, rhonchi or rales.  Chest:     Chest wall: No mass.  Abdominal:     Palpations: Abdomen is soft. There is no hepatomegaly, splenomegaly or mass.     Tenderness: There is no abdominal tenderness.      Hernia: No hernia is present.  Genitourinary:    Prostate: Normal. Not enlarged, not tender and no nodules present.     Rectum: Guaiac result negative.  Musculoskeletal:     Cervical back: Normal range of motion.     Right lower leg: No edema.     Left lower leg: No edema.  Lymphadenopathy:     Cervical: No cervical adenopathy.     Upper Body:     Right upper body: No supraclavicular adenopathy.     Left upper body: No supraclavicular adenopathy.  Skin:    General: Skin is warm and dry.  Neurological:     General: No focal deficit present.     Mental Status: He is alert and oriented to person, place, and time. Mental status is at baseline.     Cranial Nerves: Cranial nerves 2-12 are intact.     Sensory: Sensation is intact.     Motor: Motor function is intact.     Coordination: Coordination is intact.     Gait: Gait is intact.     Deep Tendon Reflexes: Reflexes are normal and symmetric.  Psychiatric:        Attention and Perception: Attention normal.        Mood and Affect: Mood normal.        Speech: Speech normal.        Behavior: Behavior normal. Behavior is cooperative.        Thought Content: Thought content normal.        Cognition and Memory: Cognition and memory normal.        Judgment: Judgment normal.   Most Recent Fall Risk Assessment:    08/18/2023   11:25 AM  Fall Risk   Falls in the past year? 0  Number falls in past yr: 0  Injury with Fall? 0  Risk for fall due to : No Fall Risks  Follow up Falls evaluation completed   Most Recent Depression Screenings:    08/18/2023   12:38 PM 06/21/2022    3:07 PM  PHQ 2/9 Scores  PHQ - 2 Score 0 0   Results:  Studies Obtained And Personally Reviewed By Me:  Upper Endoscopy 06/07/2020 found small Grade II Esophageal Varices measuring < 5 mm; Portal Hypertensive Gastropathy; Multiple Eroded Papules in the stomach were biopsied. Surgical pathology determined reactive gastropathy w/ hyperplastic changes, acute and  chronic inflammation, no H.  Pylori or intestinal metaplasia identified.   Colonoscopy 10/23/2021 removed one sessile diminutive Polyp from the transverse colon; Multiple Diverticula throughout the sigmoid, descending, and transverse colon; Internal Hemorrhoids; Small Rectal Varices; Patchy area of white discolored mucosa in the transverse colon was biopsied. Surgical Pathology determined that the polyp was a benign colonic mucosa w/ lymphoid aggregate along with the mucosa which was also w/o diagnostic abnormality. Repeat recommendation of 2028.  Labs:     Component Value Date/Time   NA 140 05/10/2023 0944   NA 136 10/07/2016 1517   K 4.1 05/10/2023 0944   K 4.1 10/07/2016 1517   CL 103 05/10/2023 0944   CL 102 10/07/2016 1517   CO2 28 05/10/2023 0944   CO2 25 10/07/2016 1517   GLUCOSE 124 (H) 05/10/2023 0944   GLUCOSE 366 (H) 10/07/2016 1517   BUN 14 05/10/2023 0944   BUN 11 10/07/2016 1517   CREATININE 0.84 05/10/2023 0944   CALCIUM 9.6 05/10/2023 0944   CALCIUM 9.6 10/07/2016 1517   PROT 6.8 05/10/2023 0944   PROT 6.8 10/07/2016 1517   ALBUMIN 3.9 08/19/2021 1025   ALBUMIN 3.5 10/07/2016 1517   ALBUMIN 3.6 04/08/2016 1452   AST 22 05/10/2023 0944   AST 19 11/01/2017 1338   ALT 17 05/10/2023 0944   ALT 21 11/01/2017 1338   ALT 35 10/07/2016 1517   ALKPHOS 88 08/19/2021 1025   ALKPHOS 129 (H) 10/07/2016 1517   BILITOT 2.6 (H) 05/10/2023 0944   BILITOT 1.0 11/01/2017 1338   GFRNONAA 91 02/15/2020 0914   GFRAA 105 02/15/2020 0914    Lab Results  Component Value Date   WBC 3.3 (L) 05/10/2023   HGB 15.8 05/10/2023   HCT 44.7 05/10/2023   MCV 98.5 05/10/2023   PLT 106 (L) 05/10/2023   Lab Results  Component Value Date   CHOL 142 05/10/2023   HDL 59 05/10/2023   LDLCALC 69 05/10/2023   TRIG 56 05/10/2023   CHOLHDL 2.4 05/10/2023   Lab Results  Component Value Date   HGBA1C 6.5 (H) 05/10/2023    Lab Results  Component Value Date   TSH 1.22 11/22/2016    Lab  Results  Component Value Date   PSA 0.31 05/10/2023   PSA 0.30 06/18/2022   PSA 0.37 02/19/2021     Assessment & Plan:  No orders of the defined types were placed in this encounter. Other Labs Reviewed today: CMP, compared to 05/2022: Glucose 124, decreased from 135; Total Bilirubin 2.6, elevated from 1.1; otherwise WNL.  Hypertension treated with HCTZ 25 mg daily and Lisinopril  20 mg daily. Blood Pressure: normotensive today at 102/70.   Hyperlipidemia with  05/10/2023 Lipid Panel WNL.  Diabetes Mellitus, type II treated with Metformin  1000 mg twice daily, Farxiga 10 mg daily, and Mounjaro  12.5 mg weekly.  05/10/2023 Glucose 124, decreased from 135; HgbA1c: 6.5, down from 6.8. Diabetic Neuropathy treated with Gabapentin  600 mg three times daily. Followed by Dr. Ronelle Coffee, Endocrinologist.   GERD treated with Nexium  40 mg daily. Upper Endoscopy 06/07/2020 found small Grade II Esophageal Varices measuring < 5 mm; Portal Hypertensive Gastropathy; Multiple Eroded Papules in the stomach were biopsied. Surgical pathology determined reactive gastropathy w/ hyperplastic changes, acute and chronic inflammation, no H. Pylori or intestinal metaplasia identified.   Low WBC; Thrombocytopenia; Hx of Benign Neutropenia with  05/10/2023  CBC, compared to 05/2022: WBC 3.3, elevated from 3.1; MCH 34.8, elevated from 34.2; Platelets 106, decreased from 108; otherwise WNL.  Obesity; BMI 43;64, weight  282 pounds 12.8 oz.      Colonoscopy 10/23/2021 removed one sessile diminutive Polyp from the transverse colon; Multiple Diverticula throughout the sigmoid, descending, and transverse colon; Internal Hemorrhoids; Small Rectal Varices; Patchy area of white discolored mucosa in the transverse colon was biopsied. Surgical Pathology determined that the polyp was a benign colonic mucosa w/ lymphoid aggregate along with the mucosa which was also w/o diagnostic abnormality. Repeat recommendation of 2028.  PSA  0.31  05/10/2023    Vaccine Counseling: Due for Covid-19; UTD on Flu, Shingles 2/2, PNA, and Tdap.   Annual wellness visit done today including the all of the following: Reviewed patient's Family Medical History Reviewed and updated list of patient's medical providers Assessment of cognitive impairment was done Assessed patient's functional ability Established a written schedule for health screening services Health Risk Assessent Completed and Reviewed  Discussed health benefits of physical activity, and encouraged him to engage in regular exercise appropriate for his age and condition.    I,Emily Lagle,acting as a Neurosurgeon for Sylvan Evener, MD.,have documented all relevant documentation on the behalf of Sylvan Evener, MD,as directed by  Sylvan Evener, MD while in the presence of Sylvan Evener, MD.   I, Sylvan Evener, MD, have reviewed all documentation for this visit. The documentation on 08/19/23 for the exam, diagnosis, procedures, and orders are all accurate and complete.

## 2023-08-22 ENCOUNTER — Encounter: Payer: Self-pay | Admitting: Internal Medicine

## 2023-08-23 MED ORDER — GABAPENTIN 300 MG PO CAPS
600.0000 mg | ORAL_CAPSULE | Freq: Three times a day (TID) | ORAL | 3 refills | Status: DC
  Filled 2023-08-23: qty 180, fill #0
  Filled 2023-08-24 – 2023-08-29 (×2): qty 180, 30d supply, fill #0
  Filled 2023-09-26: qty 180, 30d supply, fill #1
  Filled 2023-10-25: qty 180, 30d supply, fill #2
  Filled 2023-11-25: qty 180, 30d supply, fill #3

## 2023-08-24 ENCOUNTER — Other Ambulatory Visit (HOSPITAL_COMMUNITY): Payer: Self-pay

## 2023-08-24 ENCOUNTER — Other Ambulatory Visit: Payer: Self-pay

## 2023-08-29 ENCOUNTER — Other Ambulatory Visit (HOSPITAL_COMMUNITY): Payer: Self-pay

## 2023-08-29 ENCOUNTER — Other Ambulatory Visit: Payer: Self-pay

## 2023-09-10 ENCOUNTER — Other Ambulatory Visit: Payer: Self-pay | Admitting: Internal Medicine

## 2023-09-18 ENCOUNTER — Other Ambulatory Visit: Payer: Self-pay

## 2023-09-26 ENCOUNTER — Other Ambulatory Visit (HOSPITAL_COMMUNITY): Payer: Self-pay

## 2023-10-25 ENCOUNTER — Other Ambulatory Visit: Payer: Self-pay

## 2023-11-06 ENCOUNTER — Other Ambulatory Visit (HOSPITAL_COMMUNITY): Payer: Self-pay

## 2023-11-07 ENCOUNTER — Other Ambulatory Visit: Payer: Self-pay

## 2023-11-07 ENCOUNTER — Encounter: Payer: Self-pay | Admitting: Hematology & Oncology

## 2023-11-07 ENCOUNTER — Other Ambulatory Visit (HOSPITAL_COMMUNITY): Payer: Self-pay

## 2023-11-07 MED ORDER — MOUNJARO 12.5 MG/0.5ML ~~LOC~~ SOAJ
12.5000 mg | SUBCUTANEOUS | 5 refills | Status: DC
  Filled 2023-11-07: qty 2, 28d supply, fill #0
  Filled 2023-11-30 – 2023-12-01 (×2): qty 2, 28d supply, fill #1
  Filled 2023-12-28: qty 2, 28d supply, fill #2
  Filled 2024-02-08: qty 2, 28d supply, fill #3
  Filled 2024-03-03: qty 2, 28d supply, fill #4
  Filled 2024-04-01: qty 2, 28d supply, fill #5

## 2023-11-09 ENCOUNTER — Other Ambulatory Visit (HOSPITAL_COMMUNITY): Payer: Self-pay

## 2023-11-15 ENCOUNTER — Other Ambulatory Visit (HOSPITAL_COMMUNITY): Payer: Self-pay

## 2023-11-15 DIAGNOSIS — H11153 Pinguecula, bilateral: Secondary | ICD-10-CM | POA: Diagnosis not present

## 2023-11-15 DIAGNOSIS — H25013 Cortical age-related cataract, bilateral: Secondary | ICD-10-CM | POA: Diagnosis not present

## 2023-11-15 DIAGNOSIS — H25042 Posterior subcapsular polar age-related cataract, left eye: Secondary | ICD-10-CM | POA: Diagnosis not present

## 2023-11-15 DIAGNOSIS — H2513 Age-related nuclear cataract, bilateral: Secondary | ICD-10-CM | POA: Diagnosis not present

## 2023-11-15 MED ORDER — PREDNISOLONE ACETATE 1 % OP SUSP
1.0000 [drp] | Freq: Four times a day (QID) | OPHTHALMIC | 1 refills | Status: DC
  Filled 2023-11-20 – 2023-12-12 (×4): qty 5, 25d supply, fill #0
  Filled 2024-01-03: qty 5, 25d supply, fill #1

## 2023-11-15 MED ORDER — BROMFENAC SODIUM (ONCE-DAILY) 0.09 % OP SOLN
1.0000 [drp] | Freq: Every day | OPHTHALMIC | 1 refills | Status: DC
  Filled 2023-11-20 – 2023-11-21 (×2): qty 1.7, 34d supply, fill #0
  Filled 2023-12-09: qty 1.7, 30d supply, fill #0
  Filled 2023-12-09: qty 1.7, 34d supply, fill #0

## 2023-11-16 ENCOUNTER — Other Ambulatory Visit (HOSPITAL_COMMUNITY): Payer: Self-pay

## 2023-11-20 ENCOUNTER — Encounter: Payer: Self-pay | Admitting: Internal Medicine

## 2023-11-21 ENCOUNTER — Other Ambulatory Visit: Payer: Self-pay

## 2023-11-21 ENCOUNTER — Other Ambulatory Visit (HOSPITAL_COMMUNITY): Payer: Self-pay

## 2023-11-21 MED ORDER — LISINOPRIL 20 MG PO TABS
20.0000 mg | ORAL_TABLET | Freq: Every day | ORAL | 3 refills | Status: AC
  Filled 2023-11-21: qty 90, 90d supply, fill #0
  Filled 2024-02-18: qty 90, 90d supply, fill #1
  Filled 2024-05-13: qty 90, 90d supply, fill #2

## 2023-11-22 ENCOUNTER — Other Ambulatory Visit (HOSPITAL_COMMUNITY): Payer: Self-pay

## 2023-11-22 ENCOUNTER — Encounter (HOSPITAL_COMMUNITY): Payer: Self-pay

## 2023-11-25 ENCOUNTER — Other Ambulatory Visit: Payer: Self-pay

## 2023-12-01 ENCOUNTER — Other Ambulatory Visit (HOSPITAL_COMMUNITY): Payer: Self-pay

## 2023-12-01 ENCOUNTER — Other Ambulatory Visit: Payer: Self-pay

## 2023-12-01 ENCOUNTER — Other Ambulatory Visit (HOSPITAL_BASED_OUTPATIENT_CLINIC_OR_DEPARTMENT_OTHER): Payer: Self-pay

## 2023-12-09 ENCOUNTER — Other Ambulatory Visit: Payer: Self-pay

## 2023-12-09 ENCOUNTER — Other Ambulatory Visit (HOSPITAL_COMMUNITY): Payer: Self-pay

## 2023-12-09 DIAGNOSIS — E1165 Type 2 diabetes mellitus with hyperglycemia: Secondary | ICD-10-CM | POA: Diagnosis not present

## 2023-12-09 DIAGNOSIS — E7800 Pure hypercholesterolemia, unspecified: Secondary | ICD-10-CM | POA: Diagnosis not present

## 2023-12-09 MED ORDER — MOXIFLOXACIN HCL 0.5 % OP SOLN
1.0000 [drp] | Freq: Four times a day (QID) | OPHTHALMIC | 1 refills | Status: DC
  Filled 2023-12-09: qty 3, 15d supply, fill #0
  Filled 2023-12-09: qty 5, 25d supply, fill #0
  Filled 2024-01-03: qty 3, 15d supply, fill #1

## 2023-12-12 ENCOUNTER — Other Ambulatory Visit (HOSPITAL_COMMUNITY): Payer: Self-pay

## 2023-12-14 DIAGNOSIS — K769 Liver disease, unspecified: Secondary | ICD-10-CM | POA: Diagnosis not present

## 2023-12-14 DIAGNOSIS — E1140 Type 2 diabetes mellitus with diabetic neuropathy, unspecified: Secondary | ICD-10-CM | POA: Diagnosis not present

## 2023-12-14 DIAGNOSIS — I10 Essential (primary) hypertension: Secondary | ICD-10-CM | POA: Diagnosis not present

## 2023-12-14 DIAGNOSIS — E1165 Type 2 diabetes mellitus with hyperglycemia: Secondary | ICD-10-CM | POA: Diagnosis not present

## 2023-12-20 ENCOUNTER — Other Ambulatory Visit: Payer: Self-pay | Admitting: Internal Medicine

## 2023-12-20 DIAGNOSIS — H2512 Age-related nuclear cataract, left eye: Secondary | ICD-10-CM | POA: Diagnosis not present

## 2023-12-20 DIAGNOSIS — H25812 Combined forms of age-related cataract, left eye: Secondary | ICD-10-CM | POA: Diagnosis not present

## 2023-12-21 ENCOUNTER — Other Ambulatory Visit: Payer: Self-pay

## 2023-12-21 ENCOUNTER — Other Ambulatory Visit (HOSPITAL_COMMUNITY): Payer: Self-pay

## 2023-12-21 DIAGNOSIS — Z961 Presence of intraocular lens: Secondary | ICD-10-CM | POA: Diagnosis not present

## 2023-12-21 MED ORDER — GABAPENTIN 300 MG PO CAPS
600.0000 mg | ORAL_CAPSULE | Freq: Three times a day (TID) | ORAL | 3 refills | Status: DC
  Filled 2023-12-21 – 2023-12-23 (×2): qty 180, 30d supply, fill #0
  Filled 2024-01-19 – 2024-01-24 (×2): qty 180, 30d supply, fill #1
  Filled 2024-02-19: qty 180, 30d supply, fill #2
  Filled 2024-03-20: qty 180, 30d supply, fill #3

## 2023-12-23 ENCOUNTER — Other Ambulatory Visit: Payer: Self-pay

## 2023-12-23 ENCOUNTER — Other Ambulatory Visit (HOSPITAL_COMMUNITY): Payer: Self-pay

## 2023-12-28 ENCOUNTER — Other Ambulatory Visit (HOSPITAL_COMMUNITY): Payer: Self-pay

## 2023-12-29 DIAGNOSIS — H2511 Age-related nuclear cataract, right eye: Secondary | ICD-10-CM | POA: Diagnosis not present

## 2024-01-03 ENCOUNTER — Other Ambulatory Visit (HOSPITAL_COMMUNITY): Payer: Self-pay

## 2024-01-04 DIAGNOSIS — H2511 Age-related nuclear cataract, right eye: Secondary | ICD-10-CM | POA: Diagnosis not present

## 2024-01-04 DIAGNOSIS — H25811 Combined forms of age-related cataract, right eye: Secondary | ICD-10-CM | POA: Diagnosis not present

## 2024-01-04 DIAGNOSIS — H25812 Combined forms of age-related cataract, left eye: Secondary | ICD-10-CM | POA: Diagnosis not present

## 2024-01-05 DIAGNOSIS — Z961 Presence of intraocular lens: Secondary | ICD-10-CM | POA: Diagnosis not present

## 2024-01-16 DIAGNOSIS — Z961 Presence of intraocular lens: Secondary | ICD-10-CM | POA: Diagnosis not present

## 2024-01-20 ENCOUNTER — Other Ambulatory Visit: Payer: Self-pay

## 2024-01-25 ENCOUNTER — Other Ambulatory Visit: Payer: Self-pay

## 2024-02-03 ENCOUNTER — Other Ambulatory Visit: Payer: Self-pay

## 2024-02-03 ENCOUNTER — Other Ambulatory Visit (HOSPITAL_COMMUNITY): Payer: Self-pay

## 2024-02-03 DIAGNOSIS — H52203 Unspecified astigmatism, bilateral: Secondary | ICD-10-CM | POA: Diagnosis not present

## 2024-02-03 DIAGNOSIS — Z961 Presence of intraocular lens: Secondary | ICD-10-CM | POA: Diagnosis not present

## 2024-02-03 DIAGNOSIS — E113211 Type 2 diabetes mellitus with mild nonproliferative diabetic retinopathy with macular edema, right eye: Secondary | ICD-10-CM | POA: Diagnosis not present

## 2024-02-03 DIAGNOSIS — H5203 Hypermetropia, bilateral: Secondary | ICD-10-CM | POA: Diagnosis not present

## 2024-02-03 MED ORDER — PREDNISOLONE ACETATE 1 % OP SUSP
1.0000 [drp] | Freq: Two times a day (BID) | OPHTHALMIC | 1 refills | Status: DC
  Filled 2024-02-03: qty 5, 16d supply, fill #0
  Filled 2024-02-18: qty 5, 16d supply, fill #1

## 2024-02-03 MED ORDER — BROMFENAC SODIUM (ONCE-DAILY) 0.09 % OP SOLN
1.0000 [drp] | Freq: Every day | OPHTHALMIC | 1 refills | Status: AC
  Filled 2024-02-03: qty 1.7, 34d supply, fill #0

## 2024-02-08 ENCOUNTER — Other Ambulatory Visit (HOSPITAL_COMMUNITY): Payer: Self-pay

## 2024-02-18 ENCOUNTER — Other Ambulatory Visit (HOSPITAL_COMMUNITY): Payer: Self-pay

## 2024-02-20 ENCOUNTER — Other Ambulatory Visit (HOSPITAL_COMMUNITY): Payer: Self-pay

## 2024-03-05 ENCOUNTER — Other Ambulatory Visit: Payer: Self-pay

## 2024-03-13 ENCOUNTER — Telehealth: Payer: Self-pay

## 2024-03-13 DIAGNOSIS — K769 Liver disease, unspecified: Secondary | ICD-10-CM

## 2024-03-13 DIAGNOSIS — K7469 Other cirrhosis of liver: Secondary | ICD-10-CM

## 2024-03-13 NOTE — Telephone Encounter (Signed)
 Left message on machine to call back

## 2024-03-13 NOTE — Telephone Encounter (Signed)
-----   Message from Nurse Nyla BROCKS sent at 03/10/2023  4:25 PM EST ----- Order & schedule 1 year follow up MRI. Refer back to imaging result note 03/10/23.

## 2024-03-13 NOTE — Telephone Encounter (Signed)
 Dr. Avram Pt.  Please see reminder below.

## 2024-03-14 NOTE — Telephone Encounter (Signed)
 The pt has been contacted and agrees to MRI. Order sent to the schedulers  He will reach out to us  if he has not heard from them in 1 week.

## 2024-03-19 ENCOUNTER — Other Ambulatory Visit (HOSPITAL_COMMUNITY)

## 2024-03-20 ENCOUNTER — Other Ambulatory Visit (HOSPITAL_COMMUNITY): Payer: Self-pay

## 2024-03-21 ENCOUNTER — Other Ambulatory Visit (HOSPITAL_COMMUNITY): Payer: Self-pay

## 2024-03-21 ENCOUNTER — Other Ambulatory Visit: Payer: Self-pay

## 2024-03-21 DIAGNOSIS — E113291 Type 2 diabetes mellitus with mild nonproliferative diabetic retinopathy without macular edema, right eye: Secondary | ICD-10-CM | POA: Diagnosis not present

## 2024-03-21 DIAGNOSIS — Z961 Presence of intraocular lens: Secondary | ICD-10-CM | POA: Diagnosis not present

## 2024-03-21 MED ORDER — BROMFENAC SODIUM (ONCE-DAILY) 0.09 % OP SOLN
1.0000 [drp] | Freq: Every day | OPHTHALMIC | 1 refills | Status: AC
  Filled 2024-03-21: qty 1.7, 34d supply, fill #0
  Filled 2024-04-20: qty 1.7, 34d supply, fill #1

## 2024-03-21 MED ORDER — PREDNISOLONE ACETATE 1 % OP SUSP
1.0000 [drp] | Freq: Four times a day (QID) | OPHTHALMIC | 1 refills | Status: AC
  Filled 2024-03-21: qty 5, 25d supply, fill #0
  Filled 2024-04-20: qty 5, 25d supply, fill #1

## 2024-03-26 ENCOUNTER — Other Ambulatory Visit: Payer: Self-pay | Admitting: Internal Medicine

## 2024-03-29 ENCOUNTER — Ambulatory Visit (HOSPITAL_COMMUNITY)
Admission: RE | Admit: 2024-03-29 | Discharge: 2024-03-29 | Disposition: A | Source: Ambulatory Visit | Attending: Internal Medicine | Admitting: Internal Medicine

## 2024-03-29 DIAGNOSIS — K760 Fatty (change of) liver, not elsewhere classified: Secondary | ICD-10-CM | POA: Diagnosis not present

## 2024-03-29 DIAGNOSIS — K766 Portal hypertension: Secondary | ICD-10-CM | POA: Diagnosis not present

## 2024-03-29 DIAGNOSIS — R161 Splenomegaly, not elsewhere classified: Secondary | ICD-10-CM | POA: Diagnosis not present

## 2024-03-29 DIAGNOSIS — K769 Liver disease, unspecified: Secondary | ICD-10-CM | POA: Diagnosis not present

## 2024-03-29 DIAGNOSIS — K7469 Other cirrhosis of liver: Secondary | ICD-10-CM | POA: Insufficient documentation

## 2024-03-29 DIAGNOSIS — K7460 Unspecified cirrhosis of liver: Secondary | ICD-10-CM | POA: Diagnosis not present

## 2024-03-29 MED ORDER — GADOBUTROL 1 MMOL/ML IV SOLN
10.0000 mL | Freq: Once | INTRAVENOUS | Status: AC | PRN
  Administered 2024-03-29: 10 mL via INTRAVENOUS

## 2024-04-02 ENCOUNTER — Ambulatory Visit: Payer: Self-pay | Admitting: Internal Medicine

## 2024-04-02 ENCOUNTER — Other Ambulatory Visit (HOSPITAL_COMMUNITY): Payer: Self-pay

## 2024-04-02 DIAGNOSIS — K7469 Other cirrhosis of liver: Secondary | ICD-10-CM

## 2024-04-03 ENCOUNTER — Other Ambulatory Visit: Payer: Self-pay | Admitting: Internal Medicine

## 2024-04-03 DIAGNOSIS — K7469 Other cirrhosis of liver: Secondary | ICD-10-CM

## 2024-04-20 ENCOUNTER — Other Ambulatory Visit (HOSPITAL_BASED_OUTPATIENT_CLINIC_OR_DEPARTMENT_OTHER): Payer: Self-pay

## 2024-04-20 ENCOUNTER — Other Ambulatory Visit (HOSPITAL_COMMUNITY): Payer: Self-pay

## 2024-04-20 ENCOUNTER — Other Ambulatory Visit: Payer: Self-pay | Admitting: Internal Medicine

## 2024-04-21 ENCOUNTER — Other Ambulatory Visit (HOSPITAL_COMMUNITY): Payer: Self-pay

## 2024-04-21 MED ORDER — MOUNJARO 12.5 MG/0.5ML ~~LOC~~ SOAJ
12.5000 mg | SUBCUTANEOUS | 5 refills | Status: AC
  Filled 2024-04-21 – 2024-04-24 (×2): qty 2, 28d supply, fill #0
  Filled 2024-05-22: qty 2, 28d supply, fill #1

## 2024-04-23 ENCOUNTER — Other Ambulatory Visit (HOSPITAL_COMMUNITY): Payer: Self-pay

## 2024-04-23 ENCOUNTER — Other Ambulatory Visit: Payer: Self-pay

## 2024-04-23 MED ORDER — GABAPENTIN 300 MG PO CAPS
600.0000 mg | ORAL_CAPSULE | Freq: Three times a day (TID) | ORAL | 3 refills | Status: AC
  Filled 2024-04-23: qty 180, 30d supply, fill #0
  Filled 2024-05-22 – 2024-05-24 (×2): qty 180, 30d supply, fill #1

## 2024-04-24 ENCOUNTER — Other Ambulatory Visit (HOSPITAL_COMMUNITY): Payer: Self-pay

## 2024-04-24 ENCOUNTER — Other Ambulatory Visit: Payer: Self-pay

## 2024-04-25 ENCOUNTER — Other Ambulatory Visit: Payer: Self-pay

## 2024-05-13 ENCOUNTER — Other Ambulatory Visit (HOSPITAL_COMMUNITY): Payer: Self-pay

## 2024-05-23 ENCOUNTER — Other Ambulatory Visit: Payer: Self-pay

## 2024-05-23 ENCOUNTER — Encounter: Payer: Self-pay | Admitting: Internal Medicine

## 2024-05-24 ENCOUNTER — Other Ambulatory Visit (INDEPENDENT_AMBULATORY_CARE_PROVIDER_SITE_OTHER)

## 2024-05-24 DIAGNOSIS — K7469 Other cirrhosis of liver: Secondary | ICD-10-CM | POA: Diagnosis not present

## 2024-05-24 LAB — COMPREHENSIVE METABOLIC PANEL WITH GFR
ALT: 19 U/L (ref 3–53)
AST: 25 U/L (ref 5–37)
Albumin: 3.6 g/dL (ref 3.5–5.2)
Alkaline Phosphatase: 100 U/L (ref 39–117)
BUN: 16 mg/dL (ref 6–23)
CO2: 28 meq/L (ref 19–32)
Calcium: 9.3 mg/dL (ref 8.4–10.5)
Chloride: 107 meq/L (ref 96–112)
Creatinine, Ser: 0.8 mg/dL (ref 0.40–1.50)
GFR: 98.08 mL/min
Glucose, Bld: 155 mg/dL — ABNORMAL HIGH (ref 70–99)
Potassium: 4.1 meq/L (ref 3.5–5.1)
Sodium: 138 meq/L (ref 135–145)
Total Bilirubin: 2.2 mg/dL — ABNORMAL HIGH (ref 0.2–1.2)
Total Protein: 6.8 g/dL (ref 6.0–8.3)

## 2024-05-24 LAB — CBC WITH DIFFERENTIAL/PLATELET
Basophils Absolute: 0 10*3/uL (ref 0.0–0.1)
Basophils Relative: 0.2 % (ref 0.0–3.0)
Eosinophils Absolute: 0.1 10*3/uL (ref 0.0–0.7)
Eosinophils Relative: 3.2 % (ref 0.0–5.0)
HCT: 43.7 % (ref 39.0–52.0)
Hemoglobin: 15.4 g/dL (ref 13.0–17.0)
Lymphocytes Relative: 34.2 % (ref 12.0–46.0)
Lymphs Abs: 1 10*3/uL (ref 0.7–4.0)
MCHC: 35.2 g/dL (ref 30.0–36.0)
MCV: 98.6 fl (ref 78.0–100.0)
Monocytes Absolute: 0.2 10*3/uL (ref 0.1–1.0)
Monocytes Relative: 6.3 % (ref 3.0–12.0)
Neutro Abs: 1.6 10*3/uL (ref 1.4–7.7)
Neutrophils Relative %: 56.1 % (ref 43.0–77.0)
Platelets: 93 10*3/uL — ABNORMAL LOW (ref 150.0–400.0)
RBC: 4.43 Mil/uL (ref 4.22–5.81)
RDW: 14.9 % (ref 11.5–15.5)
WBC: 2.9 10*3/uL — ABNORMAL LOW (ref 4.0–10.5)

## 2024-05-24 LAB — PROTIME-INR
INR: 1.2 ratio — ABNORMAL HIGH (ref 0.8–1.0)
Prothrombin Time: 13.2 s — ABNORMAL HIGH (ref 9.6–13.1)

## 2024-05-28 ENCOUNTER — Encounter: Payer: Self-pay | Admitting: Internal Medicine

## 2024-05-28 LAB — AFP TUMOR MARKER: AFP-Tumor Marker: 4.8 ng/mL

## 2024-05-29 ENCOUNTER — Ambulatory Visit: Payer: Self-pay | Admitting: Internal Medicine

## 2024-05-30 ENCOUNTER — Ambulatory Visit: Admitting: Internal Medicine

## 2024-07-20 ENCOUNTER — Ambulatory Visit: Admitting: Internal Medicine

## 2024-08-20 ENCOUNTER — Other Ambulatory Visit: Payer: Self-pay

## 2024-08-23 ENCOUNTER — Encounter: Payer: Self-pay | Admitting: Internal Medicine
